# Patient Record
Sex: Female | Born: 1935 | Race: White | Hispanic: No | Smoking: Former smoker
Health system: Southern US, Community
[De-identification: ages and names within clinical notes are randomized; demographics above are authoritative.]

## PROBLEM LIST (undated history)

## (undated) DIAGNOSIS — N390 Urinary tract infection, site not specified: Secondary | ICD-10-CM

## (undated) DIAGNOSIS — R112 Nausea with vomiting, unspecified: Secondary | ICD-10-CM

## (undated) DIAGNOSIS — I519 Heart disease, unspecified: Secondary | ICD-10-CM

## (undated) DIAGNOSIS — E785 Hyperlipidemia, unspecified: Secondary | ICD-10-CM

## (undated) DIAGNOSIS — I10 Essential (primary) hypertension: Secondary | ICD-10-CM

## (undated) DIAGNOSIS — IMO0002 Reserved for concepts with insufficient information to code with codable children: Secondary | ICD-10-CM

## (undated) DIAGNOSIS — I509 Heart failure, unspecified: Secondary | ICD-10-CM

## (undated) DIAGNOSIS — R001 Bradycardia, unspecified: Secondary | ICD-10-CM

## (undated) DIAGNOSIS — K56609 Unspecified intestinal obstruction, unspecified as to partial versus complete obstruction: Secondary | ICD-10-CM

## (undated) DIAGNOSIS — E46 Unspecified protein-calorie malnutrition: Secondary | ICD-10-CM

## (undated) DIAGNOSIS — I779 Disorder of arteries and arterioles, unspecified: Secondary | ICD-10-CM

## (undated) DIAGNOSIS — C189 Malignant neoplasm of colon, unspecified: Secondary | ICD-10-CM

## (undated) DIAGNOSIS — I2510 Atherosclerotic heart disease of native coronary artery without angina pectoris: Secondary | ICD-10-CM

## (undated) DIAGNOSIS — J449 Chronic obstructive pulmonary disease, unspecified: Secondary | ICD-10-CM

## (undated) DIAGNOSIS — E119 Type 2 diabetes mellitus without complications: Secondary | ICD-10-CM

## (undated) DIAGNOSIS — Z9889 Other specified postprocedural states: Secondary | ICD-10-CM

## (undated) DIAGNOSIS — I739 Peripheral vascular disease, unspecified: Secondary | ICD-10-CM

## (undated) DIAGNOSIS — R609 Edema, unspecified: Secondary | ICD-10-CM

## (undated) DIAGNOSIS — R9430 Abnormal result of cardiovascular function study, unspecified: Secondary | ICD-10-CM

## (undated) DIAGNOSIS — Z720 Tobacco use: Secondary | ICD-10-CM

## (undated) DIAGNOSIS — R21 Rash and other nonspecific skin eruption: Secondary | ICD-10-CM

## (undated) DIAGNOSIS — I340 Nonrheumatic mitral (valve) insufficiency: Secondary | ICD-10-CM

## (undated) DIAGNOSIS — Z9289 Personal history of other medical treatment: Secondary | ICD-10-CM

## (undated) HISTORY — DX: Abnormal result of cardiovascular function study, unspecified: R94.30

## (undated) HISTORY — DX: Unspecified protein-calorie malnutrition: E46

## (undated) HISTORY — DX: Tobacco use: Z72.0

## (undated) HISTORY — PX: ABDOMINOPERINEAL PROCTOCOLECTOMY: SUR8

## (undated) HISTORY — DX: Heart disease, unspecified: I51.9

## (undated) HISTORY — DX: Personal history of other medical treatment: Z92.89

## (undated) HISTORY — DX: Nonrheumatic mitral (valve) insufficiency: I34.0

## (undated) HISTORY — PX: COLOSTOMY: SHX63

## (undated) HISTORY — DX: Hyperlipidemia, unspecified: E78.5

## (undated) HISTORY — DX: Atherosclerotic heart disease of native coronary artery without angina pectoris: I25.10

## (undated) HISTORY — DX: Chronic obstructive pulmonary disease, unspecified: J44.9

## (undated) HISTORY — DX: Disorder of arteries and arterioles, unspecified: I77.9

## (undated) HISTORY — DX: Reserved for concepts with insufficient information to code with codable children: IMO0002

## (undated) HISTORY — DX: Edema, unspecified: R60.9

## (undated) HISTORY — DX: Bradycardia, unspecified: R00.1

## (undated) HISTORY — DX: Urinary tract infection, site not specified: N39.0

## (undated) HISTORY — DX: Malignant neoplasm of colon, unspecified: C18.9

## (undated) HISTORY — DX: Rash and other nonspecific skin eruption: R21

## (undated) HISTORY — DX: Unspecified intestinal obstruction, unspecified as to partial versus complete obstruction: K56.609

## (undated) HISTORY — DX: Peripheral vascular disease, unspecified: I73.9

## (undated) HISTORY — DX: Type 2 diabetes mellitus without complications: E11.9

---

## 1997-10-13 ENCOUNTER — Other Ambulatory Visit: Admission: RE | Admit: 1997-10-13 | Discharge: 1997-10-13 | Payer: Self-pay | Admitting: Oncology

## 1998-06-09 ENCOUNTER — Ambulatory Visit (HOSPITAL_COMMUNITY): Admission: RE | Admit: 1998-06-09 | Discharge: 1998-06-09 | Payer: Self-pay | Admitting: Gastroenterology

## 1999-02-08 ENCOUNTER — Other Ambulatory Visit: Admission: RE | Admit: 1999-02-08 | Discharge: 1999-02-08 | Payer: Self-pay | Admitting: Family Medicine

## 1999-02-24 ENCOUNTER — Ambulatory Visit (HOSPITAL_COMMUNITY): Admission: RE | Admit: 1999-02-24 | Discharge: 1999-02-24 | Payer: Self-pay | Admitting: Family Medicine

## 1999-02-24 ENCOUNTER — Encounter: Payer: Self-pay | Admitting: Family Medicine

## 1999-04-12 ENCOUNTER — Encounter: Admission: RE | Admit: 1999-04-12 | Discharge: 1999-07-11 | Payer: Self-pay | Admitting: Family Medicine

## 1999-07-12 ENCOUNTER — Encounter: Admission: RE | Admit: 1999-07-12 | Discharge: 1999-10-10 | Payer: Self-pay | Admitting: Family Medicine

## 2000-06-19 ENCOUNTER — Ambulatory Visit (HOSPITAL_COMMUNITY): Admission: RE | Admit: 2000-06-19 | Discharge: 2000-06-19 | Payer: Self-pay | Admitting: Family Medicine

## 2001-04-02 ENCOUNTER — Ambulatory Visit (HOSPITAL_COMMUNITY): Admission: RE | Admit: 2001-04-02 | Discharge: 2001-04-02 | Payer: Self-pay | Admitting: Family Medicine

## 2001-04-20 ENCOUNTER — Emergency Department (HOSPITAL_COMMUNITY): Admission: EM | Admit: 2001-04-20 | Discharge: 2001-04-20 | Payer: Self-pay | Admitting: Emergency Medicine

## 2001-04-20 ENCOUNTER — Encounter: Payer: Self-pay | Admitting: Emergency Medicine

## 2001-12-27 ENCOUNTER — Encounter: Payer: Self-pay | Admitting: Family Medicine

## 2001-12-27 ENCOUNTER — Encounter: Admission: RE | Admit: 2001-12-27 | Discharge: 2001-12-27 | Payer: Self-pay | Admitting: Family Medicine

## 2002-10-01 ENCOUNTER — Ambulatory Visit (HOSPITAL_COMMUNITY): Admission: RE | Admit: 2002-10-01 | Discharge: 2002-10-01 | Payer: Self-pay | Admitting: Gastroenterology

## 2002-10-01 ENCOUNTER — Encounter (INDEPENDENT_AMBULATORY_CARE_PROVIDER_SITE_OTHER): Payer: Self-pay | Admitting: Specialist

## 2004-03-04 ENCOUNTER — Inpatient Hospital Stay (HOSPITAL_COMMUNITY): Admission: EM | Admit: 2004-03-04 | Discharge: 2004-03-09 | Payer: Self-pay | Admitting: Emergency Medicine

## 2005-03-27 ENCOUNTER — Inpatient Hospital Stay (HOSPITAL_COMMUNITY): Admission: EM | Admit: 2005-03-27 | Discharge: 2005-03-29 | Payer: Self-pay | Admitting: Emergency Medicine

## 2005-11-26 ENCOUNTER — Inpatient Hospital Stay (HOSPITAL_COMMUNITY): Admission: EM | Admit: 2005-11-26 | Discharge: 2005-12-08 | Payer: Self-pay | Admitting: Emergency Medicine

## 2005-12-05 ENCOUNTER — Ambulatory Visit: Payer: Self-pay | Admitting: Cardiology

## 2005-12-05 ENCOUNTER — Encounter: Payer: Self-pay | Admitting: Cardiology

## 2005-12-20 ENCOUNTER — Encounter: Admission: RE | Admit: 2005-12-20 | Discharge: 2005-12-20 | Payer: Self-pay | Admitting: Family Medicine

## 2007-06-04 ENCOUNTER — Emergency Department (HOSPITAL_COMMUNITY): Admission: EM | Admit: 2007-06-04 | Discharge: 2007-06-04 | Payer: Self-pay | Admitting: Emergency Medicine

## 2007-11-10 ENCOUNTER — Inpatient Hospital Stay (HOSPITAL_COMMUNITY): Admission: EM | Admit: 2007-11-10 | Discharge: 2007-11-13 | Payer: Self-pay | Admitting: Emergency Medicine

## 2008-07-10 ENCOUNTER — Emergency Department (HOSPITAL_COMMUNITY): Admission: EM | Admit: 2008-07-10 | Discharge: 2008-07-10 | Payer: Self-pay | Admitting: Emergency Medicine

## 2010-09-26 LAB — URINALYSIS, ROUTINE W REFLEX MICROSCOPIC
Bilirubin Urine: NEGATIVE
Glucose, UA: NEGATIVE mg/dL
Hgb urine dipstick: NEGATIVE
Ketones, ur: NEGATIVE mg/dL
Nitrite: NEGATIVE
Protein, ur: NEGATIVE mg/dL
Specific Gravity, Urine: 1.002 — ABNORMAL LOW (ref 1.005–1.030)
Urobilinogen, UA: 0.2 mg/dL (ref 0.0–1.0)
pH: 6.5 (ref 5.0–8.0)

## 2010-09-26 LAB — CBC
HCT: 40.5 % (ref 36.0–46.0)
Hemoglobin: 13.5 g/dL (ref 12.0–15.0)
MCHC: 33.3 g/dL (ref 30.0–36.0)
MCV: 88.2 fL (ref 78.0–100.0)
RBC: 4.59 MIL/uL (ref 3.87–5.11)

## 2010-09-26 LAB — BASIC METABOLIC PANEL
CO2: 26 mEq/L (ref 19–32)
Chloride: 102 mEq/L (ref 96–112)
GFR calc Af Amer: >60 mL/min (ref >60)
Potassium: 3.9 mEq/L (ref 3.5–5.1)
Sodium: 137 mEq/L (ref 135–145)

## 2010-09-26 LAB — DIFFERENTIAL
Basophils Relative: 0 % (ref 0–1)
Eosinophils Absolute: 0 10*3/uL (ref 0.0–0.7)
Monocytes Absolute: 0.5 10*3/uL (ref 0.1–1.0)
Monocytes Relative: 5 % (ref 3–12)

## 2010-10-25 NOTE — Discharge Summary (Signed)
Monica Rhodes, Monica Rhodes              ACCOUNT NO.:  [id number]   MEDICAL RECORD NO.:  [id number]          PATIENT TYPE:  INP   LOCATION:  5501                         FACILITY:  [facility]   PHYSICIAN:  Madaline Savage, MD        DATE OF BIRTH:  1928/01/27   DATE OF ADMISSION:  11/10/2007  DATE OF DISCHARGE:  11/13/2007                               DISCHARGE SUMMARY   PRIMARY CARE PHYSICIAN:  Renaye Rakers, MD   DISCHARGE DIAGNOSES:  1. Right lower lobe pneumonia.  2. Hypertension.  3. Diabetes mellitus.  4. Deconditioning.  5. History of chronic obstructive pulmonary disease.   DISCHARGE MEDICATIONS:  1. Zithromax 500 mg once daily for 4 more days.  2. Ceftin 500 mg twice daily for 4 more days.  3. Mucinex 600 mg twice daily as needed for 1 week.  4. Digoxin 0.125 mg daily.  5. Clonidine 0.2 mg twice daily.  6. Prazosin 2 mg twice daily.  7. TriCor 145 mg daily.  8. Tekturna 150/25 one tablet daily.   HISTORY OF PRESENT ILLNESS:  For a full history and physical, see the  history and physical dictated by Dr. Darene Lamer on Nov 10, 2007.  In short,  [name] is a 77 year old lady who comes in with complaints of cough  and generalized weakness for 1-week duration.  She was found to have a  right lower lobe pneumonia in her x-ray, and she was febrile with a  fever of 101.8, and she had a white count of 15.5 on admission.  She was  admitted for IV antibiotics.   HOSPITAL COURSE:  1. Right lower lobe pneumonia.  [name] was treated with Rocephin      and Zithromax for her pneumonia.  She had blood cultures drawn,      which did not grow any organism.  She said she has had pneumococcal      vaccination approximately 2 years ago.  Her cultures have been      negative here so far.  She has improved clinically.  She is      afebrile for the last 2 days.  Her white count has now come back to      normal.  We will treat her for a complete course of 8 days of      antibiotics.  I will change her to  p.o. Zithromax and Ceftin.  2. Deconditioning.  Ms. Engelken was weak on admission.  This is likely      deconditioning secondary to her pneumonia.  She was evaluated by      physical therapy and occupational therapy in the hospital and her      home health, PT, OT at this time.  3. Hypertension.  Her blood pressure has been controlled while she was      in the hospital.  We will continue her same dose of medication as      before.  4. Diabetes mellitus.  This is diet controlled at this time.  Her      sugars have been fair in the hospital.  DISPOSITION:  She is now being discharged home in stable condition.  We  will arrange for home health, PT, OT for her prior to discharge.   FOLLOWUP:  She is asked to follow up with her primary care doctor, Dr.  Parke Simmers, with her next scheduled appointment.      Madaline Savage, MD  Electronically Signed     PKN/MEDQ  D:  11/13/2007  T:  11/14/2007  Job:  [id number]

## 2010-10-25 NOTE — H&P (Signed)
NAME:  Monica Rhodes, Monica Rhodes              ACCOUNT NO.:  [id number]   MEDICAL RECORD NO.:  [id number]          PATIENT TYPE:  INP   LOCATION:  5501                         FACILITY:  [facility]   PHYSICIAN:  Madaline Savage, MD        DATE OF BIRTH:  1928-05-29   DATE OF ADMISSION:  11/10/2007  DATE OF DISCHARGE:                              HISTORY & PHYSICAL   PRIMARY CARE PHYSICIAN:  Renaye Rakers, M.D.   CHIEF COMPLAINT:  Cough and generalized weakness.   HISTORY OF PRESENT ILLNESS:  Monica Rhodes is a 77 year old lady with a  history of hypertension, diet controlled diabetes mellitus and COPD, who  comes in with complaints of weakness and cough for the last 1 week.  She  started having cough for the last week and has progressively been  getting more weak and dehydrated, as per the family.  She also now  complains of some shortness of breath today.  They managed to the Urgent  Care at [facility] and x-ray done there showed a right lower lobe  pneumonia.  She was then transferred to [facility] ED for admission.  She denies any other complaints at this time.   PAST MEDICAL HISTORY:  1. Hypertension.  2. Diet-controlled diabetes mellitus.  3. History of COPD.  4. History of colon cancer approximately 10 years ago, status post      partial colectomy.   PAST SURGICAL HISTORY:  History of partial colectomy in the past.   ALLERGIES:  NO KNOWN DRUG ALLERGIES.   CURRENT MEDICATIONS:  1. Clonidine 0.2 mg twice daily.  2. Digoxin 0.125 mg daily.  3. Prazosin 2 mg twice daily.  4. Tricor 145 mg daily.  5. Tekturna 150/25 one tablet daily.   SOCIAL HISTORY:  She smokes less than one pack of cigarettes a day.  Denies any history of alcohol or drug abuse.  She lives with her son.   FAMILY HISTORY:  Noncontributory.   REVIEW OF SYSTEMS:  GENERAL:  She denies any recent weight loss or  weight gain.  She denied having any fevers, but she was found to be  febrile here.  She denies any chills.  HEENT:  Denies any headaches, any  sore throat, any blurred vision.  CARDIOVASCULAR:  Denies chest pain,  palpitations.  RESPIRATORY:  She does complain of shortness of breath  and cough.  GI:  No abdominal pain, nausea, vomiting, diarrhea or  constipation.   PHYSICAL EXAMINATION:  She is alert and oriented x3.  VITALS:  Temperature 101.8, pulse rate 90, blood pressure 150/63,  respiratory rate 18, oxygen saturations 93% on room air.  HEENT:  Head atraumatic, normocephalic.  Pupils bilaterally equal and  reactive to light.  Mucous membranes appear moist.  NECK:  Supple.  No JVD, no carotid bruits.  CARDIOVASCULAR:  S1 and S2 heard.  Regular rate and rhythm.  CHEST:  There were a few crackles heard at the right base.  ABDOMEN:  Soft.  Bowel sounds heard.  EXTREMITIES:  No edema, cyanosis or clubbing.   LABS:  White count 15.5 with 85% neutrophils,  hemoglobin 11.9, platelets  205.  Sodium 138, potassium 4, BUN 34, creatinine 1.5, glucose 132.  X-  ray of the chest done at the Urgent Care showed a right lower lobe  pneumonia.   IMPRESSION:  1. Right lower lobe pneumonia.  2. Leukocytosis.  3. Hypertension.  4. Diabetes mellitus.  5. History of chronic obstructive pulmonary disease.   PLAN:  1. Right Lower Lobe Pneumonia.  This is a 77 year old lady who comes      in with right lower lobe pneumonia.  She has leukocytosis with a      left shift.  She is febrile with a fever of 101.8.  I will admit      her and start her on IV Rocephin and Zithromax.  I will give her      aerosol treatments.  I will also hydrate her gently and I will ask      physical therapy and occupational therapy to see her, as she has      some deconditioning at this time.  2. Hypertension.  Her blood pressure is mildly elevated at this time.      I will continue her home medications at this time.  3. Diet-controlled Diabetes Mellitus.  Her sugar is 132 at this time.      I will check her CBGs and put her on a sliding  scale at this time.  4. I will put her on deep vein thrombosis prophylaxis.      Madaline Savage, MD  Electronically Signed     PKN/MEDQ  D:  11/10/2007  T:  11/10/2007  Job:  [id number]

## 2010-10-28 NOTE — Discharge Summary (Signed)
NAMEJEANENNE, LICEA              ACCOUNT NO.:  [id number]   MEDICAL RECORD NO.:  [id number]          PATIENT TYPE:  INP   LOCATION:  5505                         FACILITY:  [facility]   PHYSICIAN:  Lonia Blood, M.D.DATE OF BIRTH:  05-Sep-1927   DATE OF ADMISSION:  11/26/2005  DATE OF DISCHARGE:  12/08/2005                                 DISCHARGE SUMMARY   Please note this is an interim discharge summary.  This dictation covers the  period from December 04, 2005 through December 08, 2005.   DISCHARGE MEDICATIONS:  1.  Protonix 40 mg q. day.  2.  Hydralazine 25 mg q.i.d.  3.  Clonidine 0.1 mg b.i.d.  4.  Lotensin 10 mg q. day.  5.  Multivitamin q. Day.   DISCHARGE DIAGNOSES:  1.  Please see previously dictated discharge diagnoses from Dr. Ellin Saba      dictation from December 02, 2005.  2.  Large left-sided pleural effusion.      1.  Layers on decubitus film.      2.  Echocardiogram without evidence of left ventricular systolic          dysfunction.      3.  Albumin 2.0.      4.  Felt to be secondary to oncotic pressure and highly responsive to          Lasix.  3.  Bradycardia.      1.  Thought to be negative chronotropic medications.      2.  Responding to decreased clonidine dose.   FOLLOWUP:  1.  Dr. Johna Sheriff on June 5 at 9:15 a.m.  2.  Dr. Parke Simmers in seven to ten days.  In followup with Dr. Parke Simmers, blood      pressure should be watched closely.  The patient's bradycardia should      also be reevaluated.  If possible, one should consider discontinuing the      patient's clonidine altogether.   ADDENDUM TO HOSPITAL COURSE:  Ms. Crupi had been progressing nicely from  the standpoint of her small bowel obstruction and parastomal hernia.  For  details concerning the initial portion of the hospitalization, please see  the dictated discharge summary by Dr. Lonia Blood on December 02, 2005.  Hospitalization was prolonged because of two primary outstanding issues.   The first issue was  significant hypoxia.  O2 sats remained in the low 90  range on room air.  Chest x-ray revealed a significant left-sided pleural  effusion with no evidence of underlying infiltrate.  Decubitus film was  obtained and did reveal that a large portion of this effusion layered out.  Albumin was noted to be 2.0.  Echocardiogram was obtained and revealed  preserved LV systolic function with no evidence of diastolic dysfunction.  These findings combined suggested this was simply a pleural effusion due to  low oncotic pressure.  The patient responded extremely well to diuretic  therapy.  At the time of discharge, O2 sats had improved to 97% on room air,  and the patient was completely asymptomatic.   The second issue that was encountered during  the remainder of the  hospitalization was that of bradycardia.  The patient had episodes of heart  rate dipping into the 40s.  A review of medication list revealed that the  patient was on clonidine at a reasonably high dose.  The decision was made  to decrease clonidine.  The clonidine was not discontinued altogether  however for fear of aggressive rebound hypertension.  The patient remained  completely asymptomatic even during episodes of severe bradycardia.  She  maintained blood pressures greater than 120.  Further inpatient stay was not  felt to be necessary to treat her bradycardia.  However, it is recommended  that we continue to decrease the patient's clonidine until such time that we  can discontinue it altogether.  At time of discharge, heart rate is 73 and  the patient is entirely asymptomatic.   The patient is deemed to be stable for discharge on December 08, 2005.  She is  to follow up with Dr. Johna Sheriff as noted above for staple removal and  reevaluation of her abdominal wounds.  She is having no abdominal pain and  is tolerating a regular diet.  She is to follow up with Dr. Renaye Rakers, her  primary care physician, in seven to ten days for routine  recheck.  At that  time, attention should be paid to the possibility of further decreasing the  patient's clonidine and reassessing her heart rate.      Lonia Blood, M.D.  Electronically Signed     JTM/MEDQ  D:  12/08/2005  T:  12/08/2005  Job:  [id number]   cc:   Lorne Skeens. Hoxworth, M.D.  [address]  [place]  [place] [address]   Renaye Rakers, M.D.  Fax: [phone]

## 2010-10-28 NOTE — Op Note (Signed)
NAME:  [facility], Monica [facility]                        ACCOUNT NO.:  [id number]   MEDICAL RECORD NO.:  [id number]                   PATIENT TYPE:  AMB   LOCATION:  ENDO                                 FACILITY:  [facility]   PHYSICIAN:  Anselmo Rod, M.D.               DATE OF BIRTH:  21-Sep-1927   DATE OF PROCEDURE:  10/01/2002  DATE OF DISCHARGE:                                 OPERATIVE REPORT   PROCEDURE PERFORMED:  Colonoscopy with snare polypectomy times five and cold  biopsies times two.   ENDOSCOPIST:  Charna Elizabeth, M.D.   INSTRUMENT USED:  Pediatric adjustable Olympus colonoscope.   INDICATIONS FOR PROCEDURE:  Personal history of rectal cancer in a 77-year-  old white female.  Rule out recurrent polyps.   PREPROCEDURE PREPARATION:  Informed consent was procured from the patient.  The patient was fasted for eight hours prior to the procedure and prepped  with a bottle of magnesium citrate and a gallon of GoLYTELY the night prior  to the procedure.   PREPROCEDURE PHYSICAL:  The patient had stable vital signs.  Neck supple.  Chest clear to auscultation.  S1 and S2 regular.  Abdomen soft with normal  bowel sounds.  Left lower quadrant colostomy bag present.   DESCRIPTION OF PROCEDURE:  The patient was placed in supine position.  The  colostomy bag was removed and the colostomy site cleaned gently and the  patient was sedated with 30 mg of Demerol and 3 mg of Versed intravenously.  Once the patient was adequately sedated and maintained on low flow oxygen  and continuous cardiac monitoring, the Olympus video colonoscope was  advanced from the rectum to the cecum.  There was some residual stool in the  colon, multiple washes were done.  The small sessile polyp was biopsied from  80 cm.  Three small sessile polyps were snared from 90 cm.  These were all  placed in one bottle.  Two small polyps were snared from the midright colon  as well.  These were placed in bottle #2.  There was  evidence of mild  melanosis coli throughout the colon.  The appendicular orifice and ileocecal  valve were clearly visualized and photographed.  The patient tolerated the  procedure well without complications.  There were a few scattered  diverticula seen throughout the colon.   IMPRESSION:  1. Multiple colonic polyps removed as mentioned above (see description     above).  2. Few scattered diverticula throughout the colon.  3. Mild melanosis coli.            RECOMMENDATIONS:  1. Await pathology results.  2. Avoid all nonsteroidals including aspirin for the next three to four     weeks.  3. Outpatient follow-up in the next two weeks for further recommendations.  Anselmo Rod, M.D.    JNM/MEDQ  D:  10/01/2002  T:  10/01/2002  Job:  [id number]   cc:   Renaye Rakers, M.D.  [phone]  [place]  [place] [address]  Fax: [phone]   Vikki Ports, M.D.  [phone]  [place]  [place] [address]  Fax: [phone]   Valentino Hue. Magrinat, M.D.  [address] [facility]  [place]  [place] [address]  Fax: [phone]

## 2010-10-28 NOTE — Consult Note (Signed)
[facility], Monica Rhodes              ACCOUNT NO.:  [id number]   MEDICAL RECORD NO.:  [id number]          PATIENT TYPE:  INP   LOCATION:  5506                         FACILITY:  [facility]   PHYSICIAN:  Jordan Hawks. Elnoria Howard, MD    DATE OF BIRTH:  July 25, 1927   DATE OF CONSULTATION:  11/28/2005  DATE OF DISCHARGE:                                   CONSULTATION   REASON FOR CONSULTATION:  Nausea, vomiting and abdominal pain.   HISTORY OF PRESENT ILLNESS:  This is a 77 year old white female who is  admitted for complaints of abdominal pain that started acutely on Saturday.  The patient's pain worsened to the point that she required further  evaluation and presented to the ER on Sunday. An acute series was performed  and it showed that she had some distention of the small bowel consistent  with either a small bowel ileus versus a small-bowel obstruction. She was  treated previously in the past for small-bowel obstruction with conservative  management, that is using an NG tube. She is status post rectal cancer  resection with colostomy in 1997 and has done well since that time in  regards to the rectal cancer. She did undergo a colonoscopy performed by Dr.  Loreta Ave one week ago with findings of two small adenomatous polyps but no other  abnormal findings. Her pain is consistent with her prior episodes and since  being admitted to the hospital she denies having any significant nausea but  prior to her admission she had several bouts of vomiting without any  evidence of hematemesis. There is no evidence of any diarrhea or fever. The  patient does report that she had a very low output into her colostomy bag.   PAST MEDICAL AND SURGICAL HISTORY:  Significant for hypertension, diabetes  and rectal cancer status post resection with colostomy.   FAMILY HISTORY:  Noncontributory.   SOCIAL HISTORY:  The patient is a longtime smoker and currently lives with  her son. No prior history of alcohol or illicit drug  use.   ALLERGIES:  PHENERGAN.   MEDICATIONS:  Clonidine, digoxin, Lasix and Tri-Chlor.   PHYSICAL EXAMINATION:  VITAL SIGNS:  Blood pressure is 177/73, heart rate is  72, respirations 20, temperature is 98.5.  GENERAL:  The patient is in moderate distress.  HEENT:  Normocephalic, atraumatic, extraocular muscles intact. Pupils equal  round and reactive to light.  NECK:  Supple, no lymphadenopathy.  LUNGS:  Clear to auscultation bilaterally.  CARDIOVASCULAR:  Regular rate and rhythm.  ABDOMEN:  Flat. There is tympany, positive bowel sounds and a small amount  of stool and gas in the colostomy bag.  EXTREMITIES:  No cyanosis, clubbing or edema.   LABORATORY DATA:  On November 28, 2005, sodium was 143, potassium 4.2, chloride  109, CO2 29, glucose is 97, BUN 26, creatinine 0.9. CBC, white blood cell  count 10.2, hemoglobin 13.7, MCV 75.7, platelets 213.   IMPRESSION:  1.  Small-bowel obstruction versus ileus.  2.  Abdominal pain secondary to #1.  3.  Status post rectal cancer resection with colostomy.  4.  Hypertension.  5.  Diabetes.   At this time, I believe the patient would benefit from having NG tube placed  as this was the prior treatment and she did report an improvement in her  symptoms. Her abdomen is not as distended as previously reported. However,  she does not want to have an NG tube as this time. She states that she has  an upper respiratory infection and would like to avoid using the NG tube.  However if her symptoms do not improve, I will insist that she undergo this  treatment. There is no evidence of an acute abdomen at this time and  therefore no immediate intervention is required. I believe a CT scan of the  abdomen and pelvis will be beneficial in order to see if she has a  transition point that would lead more to a diagnosis of small-bowel  obstruction.   PLAN:  1.  Continue n.p.o. status and conservative management.  2.  Agree with CT scan of the abdomen and  pelvis.      Jordan Hawks Elnoria Howard, MD  Electronically Signed     PDH/MEDQ  D:  11/28/2005  T:  11/28/2005  Job:  [id number]

## 2010-10-28 NOTE — H&P (Signed)
[facility]              ACCOUNT NO.:  [id number]   MEDICAL RECORD NO.:  [id number]          PATIENT TYPE:  INP   LOCATION:  0101                         FACILITY:  [facility]   PHYSICIAN:  Vikki Ports, MDDATE OF BIRTH:  1927/09/01   DATE OF ADMISSION:  03/27/2005  DATE OF DISCHARGE:                                HISTORY & PHYSICAL   ADMISSION DIAGNOSIS:  Partial small bowel obstruction.   HISTORY OF PRESENT ILLNESS:  The patient is a very pleasant 77 year old  white female who underwent abdominoperineal resection by me in 1997.  The  patient now presents with nausea and vomiting since late afternoon  yesterday.  The patient has had some minimal output from her ostomy since  that time.  She feels somewhat better after NG tube has been placed in the  emergency room.   PAST SURGICAL HISTORY:  Significant as above.   PAST MEDICAL HISTORY:  1.  Cardiomegaly.  2.  Hypercholesterolemia.  3.  Hypertension.  4.  Noninsulin-dependent diabetes mellitus.   PHYSICAL EXAMINATION:  GENERAL:  She is an age-appropriate female in  moderate distress.  VITAL SIGNS:  Blood pressure 176/85; heart rate 80; respiratory rate 16.  HEENT:  Normocephalic, atraumatic.  Pupils are equal, round, and reactive.  NECK:  Supple and soft, with a right carotid bruit.  LUNGS:  Clear to auscultation and percussion x2.  ABDOMEN:  Moderately obese but soft, completely nontender, with some hyper-  pitched bowel sounds.  Stoma is intact and healthy.  EXTREMITIES:  Show no clubbing, cyanosis, or edema.   LABORATORY VALUES:  White count 15,000.  Abdominal x-ray is consistent with  a partial small bowel obstruction.   IMPRESSION:  Partial small bowel obstruction.   PLAN:  Admission.  IV fluids.  NG tube decompression.  Follow up films and  clinical exam.      Vikki Ports, MD  Electronically Signed     KRH/MEDQ  D:  03/27/2005  T:  03/27/2005  Job:  [id number]

## 2010-10-28 NOTE — H&P (Signed)
NAME:  [facility], Monica [facility]              ACCOUNT NO.:  [id number]   MEDICAL RECORD NO.:  [id number]          PATIENT TYPE:  EMS   LOCATION:  MAJO                         FACILITY:  [facility]   PHYSICIAN:  Mobolaji B. Bakare, M.D.DATE OF BIRTH:  1927-12-18   DATE OF ADMISSION:  03/03/2004  DATE OF DISCHARGE:                                HISTORY & PHYSICAL   ADDENDUM:  For copy-to list only.       MBB/MEDQ  D:  03/04/2004  T:  03/04/2004  Job:  [id number]   cc:   Renaye Rakers, M.D.  [phone] [address], [address]  [place]  [place] [address]  Fax: [phone]

## 2010-10-28 NOTE — Discharge Summary (Signed)
NAMEPHELAN, GOERS              ACCOUNT NO.:  [id number]   MEDICAL RECORD NO.:  [id number]          PATIENT TYPE:  INP   LOCATION:  5151                         FACILITY:  [facility]   PHYSICIAN:  Isidor Holts, M.D.  DATE OF BIRTH:  [birth year]   DATE OF ADMISSION:  03/04/2004  DATE OF DISCHARGE:  03/09/2004                                 DISCHARGE SUMMARY   PMD:  Dr. Renaye Rakers.   PRIMARY DIAGNOSES:  1.  Partial small bowel obstruction.  2.  Urinary tract infection.   SECONDARY DIAGNOSES:  1.  Diet controlled diabetes mellitus.  2.  Hypertension.  3.  Dyslipidemia.  4.  Tobacco use.   DISCHARGE MEDICATIONS:  1.  Nicoderm CQ (21 mg) patch applied to skin daily for smoking cessation.  2.  Ciprofloxacin 500 mg p.o. b.i.d. for 2 days.  3.  Continue preadmission medication.   PROCEDURES:  1.  Abdominal series with single view chest x-ray dated March 03, 2004,      which showed multiple dilated small bowel loops with air fluid levels,      multiple surgical clips in the lower abdomen, no free intraperitoneal      air.  There was also cardiomegaly and probable COPD.  2.  CT abdomen/pelvis dated March 04, 2004, showed cardiomegaly, small      pericardial effusion, mild ascites, mild left lower lobe atelectasis,      small oval area of decreased density in each kidney.  These represent      complex cyst or solid masses.  Abdominal CT scan with contrast was      recommended in 3 months.  Also noted was high grade partial small bowel      obstruction, small amount of free peritoneal fluid, colon      diverticulosis, __________ without obstruction.  3.  Abdominal x-ray dated March 05, 2004.  This showed slightly      distended loops of small bowel which remained secondary to partial small      bowel obstruction, NG tube tip in distal antrum/duodenal bulb, no free      air.   CONSULTATIONS:  Chevis Pretty, III, M.D., general surgery.   ADMISSION HISTORY:  Essentially as  per H&P dated March 04, 2004.  However, in brief this is a 77 year old Caucasian female with a history of  hypertension and diet controlled diabetes and the prior history of colectomy  for prior colon cancer in 1997 as well as colonoscopy 2004 with polypectomy.  She developed acute onset of nausea and vomiting at about noon on the day of  admission associated with mild periumbilical abdominal pain following eating  lunch of [place] sandwich.  Initial physical examination demonstrated  dehydration and laboratory investigations revealed a white cell count of  18,200.  LFTs were normal.  Urinalysis showed a positive sediment consistent  with urinary tract infection.  Abdominal series confirmed small bowel  obstruction.  The patient was admitted for further investigation, workup and  appropriate management.  Dr. Chevis Pretty, from general surgery, was called in  consultation.   HOSPITAL COURSE:  1.  Partial  small bowel obstruction.  Patient was kept n.p.o. and commenced      on intravenous fluid hydration.  NG tube was passed and low pressure      suction was applied.  With this conservative regimen, patient's symptoms      gradually subsided, bowel sounds returned, patient started passing      flatus and gas, abdominal pain resolved.  Improvement was steady      thereafter and eventually NG was removed and patient was able to      recommence oral intake with gradual broadening of her diet without      untoward effects.  2.  Urinary tract infection treated successfully with a course of      ciprofloxacin.  3.  Diabetes mellitus.  Patient's blood glucose remained within normal      limits during the course of her hospital stay.  4.  Dyslipidemia.  Patient is likely to recommence her preadmission lipid      lowering medication under the auspices of her PMD.  5.  Hypertension.  This proved difficult to control during the initial days      of patient's hospital stay, principally because she was  unable to      recommence her p.o. medication had to be given parenteral forms of      antihypertensives, however, once she resumed oral intake BP was      adequately controlled with her preadmission medications which we      expected to continue.   DISPOSITION:  Patient was subsequently discharged in satisfactory condition  on March 09, 2004.   ACTIVITIES:  As tolerated.  She is to continue a diabetic diet.  She has a  followup appointment scheduled with Dr. Renaye Rakers, her PMD, on March 16, 2004.       CO/MEDQ  D:  03/12/2004  T:  03/13/2004  Job:  [id number]

## 2010-10-28 NOTE — H&P (Signed)
NAME:  Monica Rhodes, Monica Rhodes              ACCOUNT NO.:  [id number]   MEDICAL RECORD NO.:  [id number]          PATIENT TYPE:  INP   LOCATION:  5506                         FACILITY:  [facility]   PHYSICIAN:  Salomon Fick, N.P.    DATE OF BIRTH:  November 16, 1927   DATE OF ADMISSION:  11/26/2005  DATE OF DISCHARGE:                                HISTORY & PHYSICAL   CHIEF COMPLAINT:  A 77 year old, white, female patient who presented  complaining of abdominal pain.   HISTORY OF PRESENT ILLNESS:  This is a 77 year old, white, female patient  who came into hospital today because of abdominal pain.  Abdominal pain is  crampy and intermittent.  Pain is 8/10 in severity.  It was relieved at the  ED with IV pain medicine.  The patient had colonoscopy last week on Monday  and this was done by Dr. Loreta Ave.  The patient told me that she had some polyps  removed.  The biopsy report is pending.  Since the colonoscopy, the patient  has not been feeling well.  She has had very minimal movement, but she  denies any significant nausea or vomiting.  She also denies any abdominal  distention.  The patient told me she has had bowel obstruction in the past  especially in 2006.  At that time, she was very sick.  She had a lot of  nausea and vomiting at that time.  This time around, it seems that her  condition was much more stable compared to last time.  She has history of  colonoscopy in the past and she says she has not been passing a lot of stool  in the last few days.  She has not been eating adequately.  The patient  denies any headache, no chest pain, shortness of breath, but she says she  had some cold early last week which resolved already.  She denies any fever.  There are no joint pains.  There is no feet swelling.   PAST MEDICAL HISTORY:  1.  Hypertension.  2.  Diabetes, diet-controlled.  3.  History of colon cancer about 10 years ago for which she was treated.      The patient and family member admits she has  been free of cancer since      that time.   FAMILY HISTORY:  Noncontributory.   PAST SURGICAL HISTORY:  History of colon surgery for colon surgery with  colostomy bag.   SOCIAL HISTORY:  She smokes cigarettes with less than a pack per day.  She  has not smoked for the past few days.  She has no history of drug abuse.  She is retired.  She lives with her son.  She does not drink alcohol.  There  is no history of drug abuse.   ALLERGIES:  Bad reaction to PHENERGAN some time ago and she does not want  any of this.   MEDICATIONS:  1.  Clonidine 0.2 mg twice daily.  2.  Digoxin 0.125 mg daily.  3.  Lasix 20 mg daily.  4.  Tri-Chlor 145 mg daily.   PHYSICAL  EXAMINATION:  VITAL SIGNS:  Elevated blood pressure of 175/67,  respirations 16, pulse 59, temperature 98.4.  When she came in, oxygen  saturation was 94% on room air.  HEENT:  Conjunctivae pink.  She has loss of subcutaneous tissue.  She has no  jaundice.  Mucous membrane slightly dry.  NECK:  Supple.  No masses palpable.  CHEST:  Clear to auscultation.  Clear breath sounds.  No wheezes or  crackles.  CARDIAC:  S1, S2.  No murmur was appreciated.  No gallop.  There is no  carotid bruit.  Pulses are palpable in the periphery.  ABDOMEN:  Flat, soft, nondistended, slightly tender diffusely.  There is no  rebound or tenderness.  There are no masses palpable.  She has a colostomy  bag.  EXTREMITIES:  No edema.  She has very minimal varicose veins on the left.  No joint swelling.  No joint tenderness.  NEUROLOGIC:  The patient is alert and oriented to time, place and person.  There is no focal neurologic deficits.  Power is 4/4 in all extremities.  Tongue is midline.  Gait is not tested because of abdominal pain.   LABORATORY DATA AND X-RAY FINDINGS:  Abdominal x-ray was done which suggests  ileus versus partial small bowel obstruction.   Blood work shows sodium 139, potassium 3.6, chloride 100, bicarb 30, BUN 30,  creatinine  4.9, glucose 128.  AST 113, ALT 8, Alk phos 46, total bilirubin  1.0.  CBC shows WBC 10.2, hemoglobin 13.7, hematocrit 41.2, platelets 213  which area all within normal limits.   ASSESSMENT:  1.  Abdominal pain secondary to ileus versus partial small bowel      obstruction.  2.  History of hypertension.  3.  History of diabetes mellitus type 2 which is diet-controlled.  4.  History of colostomy.   PLAN:  Admit the patient to telemetry bed.  I will manage conservatively.  She is going to be n.p.o. for the next 18 hours at which time she will be  reviewed.  She will have fingerstick glucose check every 6 hours.  Check  oxygen saturation every 4-6 hours.  Record urine output.  Activity to be bed  rest.  Continue normal saline at 50 mL per hour while she is n.p.o. with 20  mEq of KCl to 1 L of normal saline.  I am going to repeat abdominal x-ray in  a.m.  IV Zofran 2 mg q.4h. p.r.n. nausea or vomiting.  I will continue  digoxin, Lasix, Tri-Chlor.  The patient will be consulted about tobacco  cessation.      Salomon Fick, N.P.     MES/MEDQ  D:  11/26/2005  T:  11/27/2005  Job:  [id number]

## 2010-10-28 NOTE — H&P (Signed)
NAME:  Achey, Merve              ACCOUNT NO.:  [id number]   MEDICAL RECORD NO.:  [id number]          PATIENT TYPE:  EMS   LOCATION:  MAJO                         FACILITY:  [facility]   PHYSICIAN:  Mobolaji B. Bakare, M.D.DATE OF BIRTH:  Jun 06, 1928   DATE OF ADMISSION:  03/03/2004  DATE OF DISCHARGE:                                HISTORY & PHYSICAL   CHIEF COMPLAINT:  Nausea, vomiting and abdominal pain.   HISTORY OF PRESENT ILLNESS:  Ms. Winfree is a 77 year old white female with  history of hypertension and diet controlled diabetes who is presenting with  acute onset of nausea and vomiting which started about noon today.  She had  accompanying mild periumbilical abdominal pain.  This afternoon she had a  [place] sandwich for lunch and thereafter developed nausea and vomiting.  She  vomited about 6 times; there has been no diarrhea, no fever, no chills.  She  also notes abdominal pain as more of a periumbilical discomfort which she  rates as a 5/10 in severity.  No aggravating factors.  The pain is not  completely relived by vomiting.  She does have a colostomy and back  subsequent to the colectomy she had _____ in 1997 and she changes the bag  every 3 days.  She has not noticed any increase or decrease in the volume of  stool.  She did change her bag today and no changes were seen after she  changed her bag today.   PAST SURGICAL HISTORY:  1.  Colectomy for prior colon cancer in 1997.  2.  Colonoscopy in 2004 with polypectomy.   REVIEW OF SYSTEMS:  She denies any dysuria, urgency, or increase in  micturition other than when she started using water pills.  No cough, no  chest pain, no shortness of breath.  No headaches. No skin rash.   PAST MEDICAL HISTORY:  1.  Significant for an enlarged heart.  2.  Hypercholesterolemia.  3.  Hypertension.  4.  Diabetes mellitus.  5.  Carotid stenosis.  She has had serial Dopplers done in follow up.   MEDICATIONS:  The patient cannot recall  the names and dosages of her  medications.  However she does state that she is on water pills.  She is on  Tricor and also some medications for hypertension.  Her daughter will bring  the list of medications as soon as possible.   ALLERGIES:  No known drug allergies.   SOCIAL HISTORY:  The patient lives with her son and she is a widow; she has  5 children.  She smokes cigarettes, 1 pack/day.  No history of alcohol  abuse.  She is fairly independent with activities of daily living.  She was  able to clean out the pool yesterday without any shortness of breath or  chest pain.   PHYSICAL EXAMINATION:  VITAL SIGNS:  In the emergency department temperature  was 98.4, blood pressure 187/82, pulse of 86, respiratory rate 24 and O2  saturations of 96% on room air.  GENERAL:  She is not acutely ill looking other than respiratory distress.  HEENT:  Atraumatic,  normocephalic head.  Pupils are equal, round and  reactive to light.  Mucous membranes dry.  NECK:  There is a right carotid bruit. No thyromegaly, no cervical  lymphadenopathy.  CHEST:  With clear breath sounds.  No crackles, no wheeze.  CARDIOVASCULAR:  S1, S2, ejection systolic murmur in the mitral area.  ABDOMEN:  Obese, soft, tender epigastrium, periumbilical.  Colostomy bag has  some feces.  Bowel sounds present, no palpable organomegaly.  No  holosystolic murmur.  EXTREMITIES:  No pedal edema, no calf tenderness, no cyanosis.  CNS:  No focal neurologic deficits.  SKIN:  No rash, no petechia.   LABORATORY DATA:  Initial data revealed a white cell count of 18,200,  hematocrit 44%, hemoglobin 15.1, platelets 222,000, neutrophils 97%,  lymphocytes 3%.  Sodium 142, potassium 4.2, chloride 104, bicarb 28, BUN 33,  glucose 160, creatinine 1.2, calcium 9.5, total protein 7.0, albumin 4.4.  AST 19, ALT 11, alkaline phosphatase 42, bilirubin 0.5.  Urinalysis:  yellow  color, cloudy casts, specific gravity 1.027, positive for esterase,  negative  for nitrates.  White cell count on microscopic: 11 to 20, many epithelial  cells, hyaline casts.   Abdominal x-ray compatible with early small bowel obstruction; cardiomegaly  and chronic pulmonary obstructive disease.   ASSESSMENT AND PLAN:  Ms. Dillen is a 77 year old white female with history  of hypertension, diet controlled diabetes mellitus, cardiomegaly, presenting  with acute onset of nausea and vomiting with abdominal pain this afternoon  after eating a [place] sandwich.  No fever, no chills. She does have an  elevated white cell count and an elevated BUN and a positive urinalysis  without urinary symptoms.  She has a colostomy secondary to colectomy for  colon cancer in 1997.  X-ray of the abdomen showed partial early small bowel  obstruction.   1.  Abdominal pain/nausea and vomiting.  Differentials include small bowel      obstruction versus gastritis.  Will keep patient n.p.o.  Intravenous      fluids. Will do a CT scan of the abdomen and pelvis to further evaluate      the abdominal pain.  Will hold off NG tube placement at this point.  No      gross distention after the x-ray.  Phenergan 12.5 mg q.4h p.r.n. nausea      and vomiting.  Dilaudid 4 mg IV q.4h p.r.n. abdominal pain.  Will check      amylase, lipase, will check antigenic CEA.   1.  Positive urinalysis. Patient received ciprofloxacin antibiotic in the      emergency department. Would continue with this and repeat urinalysis and      culture.  Of note is that many bacille were found on the urine.   1.  Hypertension.  Blood pressure is elevated, at this point will recheck      blood pressure and if still elevated will give Norvasc p.o. added to      patient's medication.   1.  Hyperlipidemia.  Will continue with Tricor with monitoring of the liver.  2.  History of diabetes mellitus, will check CBG t.i.d. Check hemoglobin      A1C. 3.  Elevated BUN, hopefully with rehydration this will resolve.  4.   History of cardiomegaly but no history of congestive heart failure as      per patient.  Will given gentle hydration.  1.  History of carotid stenosis.  Patient has been having serial Dopplers  and is stable.  2.  History of smoking.  Nicotine patch.  3.  Deep venous thrombosis prophylaxis.  Lovenox 40 mg SQ daily.       MBB/MEDQ  D:  03/04/2004  T:  03/04/2004  Job:  [id number]

## 2010-10-28 NOTE — Consult Note (Signed)
NAME:  Monica Rhodes, Monica Rhodes              ACCOUNT NO.:  [id number]   MEDICAL RECORD NO.:  [id number]          PATIENT TYPE:  INP   LOCATION:  5506                         FACILITY:  [facility]   PHYSICIAN:  Sharlet Salina T. Hoxworth, M.D.DATE OF BIRTH:  [birth year]   DATE OF CONSULTATION:  11/28/2005  DATE OF DISCHARGE:                                   CONSULTATION   CHIEF COMPLAINT:  Abdominal pain, nausea, vomiting.   PRESENT ILLNESS:  I was asked by Dr. Angelena Sole tonight to evaluate Ms. Matuska.  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 Alfonse Ras 10 years ago.  She has  had no evidence of recurrence on follow up and has had regular  colonoscopies.  She does have a history of several episodes of small bowel  obstruction requiring hospitalization over the last several years that have  resolved nonoperatively.  She was admitted to the hospital service on November 26, 2005 with a 24-hour history of quite severe diffuse crampy abdominal  pain, nausea and vomiting.  She had minimal output from her stoma for a  couple of days.  The patient hospitalized treated with bowel rest, pain  medication and nausea medication.  She has continued to have plain x-ray  evidence of bowel obstruction and this evening underwent a CT scan of the  abdomen indicating incarcerated parastomal hernia as described below.  She  has noted some swelling around her stoma for several months.   PAST MEDICAL HISTORY:  Surgery significant only as above.  Medically, she is  followed for diabetes oral agent controlled, hypertension and COPD.   MEDICATIONS AT HOME:  Clonidine 0.2 mg twice daily, digoxin 0.125 mg daily,  Lasix 20 mg daily, Tricor 145 mg daily.   ALLERGIES:  History of reaction to PHENERGAN.   SOCIAL HISTORY:  She has smoked about a half-pack of cigarettes up until  this hospitalization.   No alcohol.  Lives with her son.   FAMILY HISTORY:  Noncontributory.   REVIEW OF SYSTEMS:  GENERAL:  Weight stable.  No fever or chills.  HEENT:  No vision, hearing or swallowing problems.  RESPIRATORY:  She has somewhat  chronic cough and some dyspnea on exertion.  CARDIAC:  Denies chest pain,  palpitations, history of heart disease.  ABDOMEN:  GI as above.  GU:  Negative.   PHYSICAL EXAMINATION:  VITAL SIGNS:  Temperature is 98, heart rate 71,  respirations 18, blood pressure 182/70.  GENERAL:  She is an elderly fairly thin white female in no acute distress.  SKIN:  Warm, dry.  No rash or infections.  HEENT:  No mass or thyromegaly.  Sclerae nonicteric.  Oropharynx clear.  LUNGS:  Some mild upper airway congestion.  No increased work of breathing.  CARDIAC:  Regular rate and rhythm.  No murmurs, no edema.  ABDOMEN:  Mildly distended.  Stoma present left lower quadrant with empty  ostomy bag.  There is a good sized parastomal hernia that is tender.  It  is  only partially reducible.  Remainder of abdomen is soft and mildly tender.  Healed low midline incision without hernias.  Perineum negative.  EXTREMITIES:  Superficial varicosities, trace edema lower extremities.  NEUROLOGIC:  Alert, oriented.  Motor and sensory exam is grossly normal.   LABORATORY:  CBC, electrolytes, LFTs normal.  Chest x-ray shows evidence of  COPD, mild cardiomegaly, questionable left lower lobe infiltrate.  CT scan  abdomen and pelvis reviewed which shows a good size parastomal hernia  containing small-bowel with point of obstruction in the hernia, colon is  decompressed.  The proximal bowel is markedly distended.  There is some  ascites present as well.   ASSESSMENT/PLAN:  Small-bowel obstruction secondary to incarcerated  parastomal hernia.  She has significant bowel distension and a tender  hernia.  I believe will require emergency repair to a laparotomy incision.  This was discussed with the patient and  her daughter.  The need for the  procedure and major risks of bleeding, infection, cardiorespiratory  complications were discussed and understood. We will plan to proceed with  emergency repair.      Lorne Skeens. Hoxworth, M.D.  Electronically Signed     BTH/MEDQ  D:  11/28/2005  T:  11/29/2005  Job:  [id number]

## 2010-10-28 NOTE — Discharge Summary (Signed)
Monica Rhodes, Monica Rhodes              ACCOUNT NO.:  [id number]   MEDICAL RECORD NO.:  [id number]          PATIENT TYPE:  INP   LOCATION:  1521                         [facility]AC[facility]   PHYSICIAN:  Vikki Ports, MDDATE OF BIRTH:  [birth year]   DATE OF ADMISSION:  03/27/2005  DATE OF DISCHARGE:  03/29/2005                                 DISCHARGE SUMMARY   ADMISSION DIAGNOSIS:  Small bowel obstruction.   DISCHARGE DIAGNOSIS:  Small bowel obstruction.   CONDITION ON DISCHARGE:  Good and improved.   ADMITTING PHYSICIAN:  Lovie Chol.   PLANS FOR FOLLOW-UP:  P.r.n.   DISPOSITION:  Discharged to home.   HISTORY OF PRESENT ILLNESS:  The patient is a very pleasant 77 year old  female who underwent APRIL in 1997 for rectal cancer and now presents with  nausea and vomiting for a 1-day history, minimal ostomy output. NG tube had  been placed in the emergency room and abdominal series was consistent with a  partial small-bowel obstruction. She was admitted for NG tube decompression  and clinical follow-up.   HOSPITAL COURSE:  The patient was admitted, had NG tube placed, was followed  for 24 hours. On hospital day #1 NG tube drainage was minimal. The patient  was having significant gas and stool in her ostomy bag. She was started on  clears with her NG tube clamped, which she tolerated well. NG tube was  pulled. She was tolerating clear liquids by hospital day #2. She was  advanced to regular diet which she tolerated, and she was discharged home.  Follow-up is with me on a p.r.n. basis. medications at discharge are  unchanged from admission and include Catapres, Lanoxin, Lasix, alprazolam,  HCl, and Tricor. Allergies are to [facility] only.      Vikki Ports, MD  Electronically Signed     KRH/MEDQ  D:  05/03/2005  T:  05/03/2005  Job:  [id number]

## 2010-10-28 NOTE — Consult Note (Signed)
NAME:  Monica Rhodes, Monica Rhodes              ACCOUNT NO.:  [id number]   MEDICAL RECORD NO.:  [id number]          PATIENT TYPE:  INP   LOCATION:  5151                         FACILITY:  [facility]   PHYSICIAN:  Ollen Gross. Vernell Morgans, M.D. DATE OF BIRTH:  [birth year]   DATE OF CONSULTATION:  03/04/2004  DATE OF DISCHARGE:                                   CONSULTATION   SURGERY CONSULTATION:   HISTORY OF PRESENT ILLNESS:  Ms. Monica Rhodes is a 77 year old white female who  has a history of rectal cancer and a colostomy performed by Dr. Luan Pulling  in the past.  She presents with abdominal pain, nausea, and vomiting since  Wednesday afternoon.  She has felt bloated but actually feels a little bit  better today and feels as though she has had some stool and air come out of  her colostomy.  She otherwise denies fevers, chills, chest pain, shortness  of breath, or dysuria.   REVIEW OF SYSTEMS:  Other review of systems unremarkable.   PAST MEDICAL HISTORY:  Her past medical history significant for diabetes,  hypertension, rectal cancer, carotid stenosis as well.   PAST SURGICAL HISTORY:  Past surgical history significant for a colectomy  and colostomy and colonoscopy.   MEDICATIONS AT HOME:  Prazosin, Lasix, Tricor, Lanoxin, and clonidine.   ALLERGIES:  No known drug allergies.   SOCIAL HISTORY:  She denies the use of alcohol or tobacco products.   FAMILY HISTORY:  Noncontributory.   PHYSICAL EXAMINATION:  GENERAL:  She is a well-developed, well-nourished  white female in no acute distress.  SKIN:  Warm and dry with no jaundice.  EYES:  Her extraocular muscles are intact; pupils equal, round, react to  light; sclerae are nonicteric.  LUNGS:  Her lungs are clear bilaterally with no use of accessory respiratory  muscles.  HEART:  Heart has a regular rate and rhythm with an impulse in the left  chest.  ABDOMEN:  Soft but distended with no palpable mass or hepatosplenomegaly.  She does have some air and  stool in her colostomy bag.  EXTREMITIES:  No cyanosis, clubbing, or edema.  PSYCHOLOGICAL:  She is alert and oriented x3 with no evidence of any type of  anxiety or depression.   LABORATORY DATA:  On review of her lab work her white count was elevated to  13,000.  On reviewing her CT scan it was consistent for what looked like a  high-grade small-bowel obstruction and probably a parastomal hernia but no  evidence of obstruction at the stoma site.   ASSESSMENT AND PLAN:  This is a 77 year old white female with what appears  to be at least a high-grade partial small-bowel obstruction.  It appears as  though over the last day or so she has gotten a little bit better and has  passed some stool and air into her bag.  I think to give her the chance that  this resolving I would recommend strict bowel rest with NG tube suctioning  and we will monitor her exam closely and follow up with some abdominal x-  rays in the morning.  If she improves she may not require surgery but if she  does not show any improvement in the next day or so then she may come to  require exploration to fix the source of obstruction.  I have discussed this  with her and her family and they understand and are agreeable to this  treatment plan.  I will continue to follow her closely and we appreciate the  opportunity to help take care of this patient.       PST/MEDQ  D:  03/04/2004  T:  03/05/2004  Job:  [id number]

## 2010-10-28 NOTE — Discharge Summary (Signed)
NAME:  Monica Rhodes, Monica Rhodes              ACCOUNT NO.:  [id number]   MEDICAL Rhodes NO.:  [id number]          PATIENT TYPE:  INP   LOCATION:  5505                         FACILITY:  [facility]   PHYSICIAN:  Lonia Blood, M.D.       DATE OF BIRTH:  15-May-1928   DATE OF ADMISSION:  11/26/2005  DATE OF DISCHARGE:                                 DISCHARGE SUMMARY   INTERIM DISCHARGE SUMMARY:   DIAGNOSIS:  1.  Small bowel obstruction and parastomal hernia  status post laparotomy      with lysis of adhesions and repair of parastomal hernia by Dr. Glenna Fellows.  2.  Postoperative ileus.  3.  Chronic obstructive pulmonary disease.  4.  Pneumonia.  5.  Postoperative delirium, resolved.  6.  Hypertension.  7.  Postoperative hypertensive crisis, improved.  8.  History of colon cancer status post resection of colostomy and radiation      therapy.  9.  Tobacco abuse.  10. Diet-controlled diabetes mellitus.   DISCHARGE MEDICATIONS:  His discharge medications will be dictated at the  time of the actual discharge of this patient.   PROCEDURES:  1.  November 26, 2005, the patient underwent an abdominal two views that showed      COPD, ileus versus partial small bowel obstruction.  2.  November 28, 2005, the patient underwent a computed tomography scan of her      abdomen that showed the presence of distal small bowel obstruction and      incarcerated far parastomal hernia, ascites, and peritoneal nodularity,      also cholelithiasis was noted.  3.  November 29, 2005, the patient underwent a laparotomy with hernia repair and      lysis of adhesions.  4.  November 30 1005, the patient underwent head CT that showed no acute      intracranial abnormality.   CONSULTATIONS DURING THIS ADMISSION:  1.  Jordan Hawks Elnoria Howard, M.D. from gastroenterology.  2.  [facility], Dr. Glenna Fellows.   HISTORY AND PHYSICAL:  For admission history and physical refer to the  dictated H&P done on November 26, 2005,  by Dr. Hadley Pen.  Briefly, Monica Rhodes is a  77 year old woman with a history of colon cancer status post resection with  colostomy who presented to [facility] with  complaints of abdominal pain and inability to pass gas.  She was admitted  with a presumed diagnosis of small bowel obstruction.   HOSPITAL COURSE:  1.  Small bowel obstruction.  Monica Rhodes was admitted to the regular floor      of [facility] where she was started on bowel rest and      intravenous fluids.  The patient's abdominal exam has worsened over the      next 48 hours after admission and on November 28, 2005, a computer      tomography scan of for abdomen showed a possible parastomal incarcerated      hernia.  On November 29, 2005, the patient underwent emergent laparotomy  with lysis of adhesions and parastomal hernia repair.  The postoperative      course of this patient was complicated with an ileus as well as with      acute delirium and a hypertensive crisis.  Monica Rhodes was managed with      repeat doses of haloperidol as well as intravenous doses of metoprolol      and hydralazine to control her blood pressure.   1.  COPD with acute bronchitis versus pneumonia.  Monica Rhodes had her      antibiotics adjusted during this hospitalization.  She was started      initially on Avelox which was later switched Zosyn intravenously.  At      the time of my dictation, the patient is afebrile and her course of      antibiotic is ongoing.   1.  Hypertensive with significant hypertensive crisis. Currently, at the      time of my dictation, Monica Rhodes is on intravenous metoprolol and      intravenous hydralazine as well as on a clonidine patch and her blood      pressure is currently fairly well controlled.      Lonia Blood, M.D.  Electronically Signed     SL/MEDQ  D:  12/02/2005  T:  12/02/2005  Job:  [id number]   cc:   Renaye Rakers, M.D.  Fax: [phone]   OZHYQM VHQ IONG, M.D.  Fax:  [phone]   Lorne Skeens. Hoxworth, M.D.  [address]  [place]  [place] [address]

## 2010-10-28 NOTE — Op Note (Signed)
NAME:  Monica Rhodes, Monica Rhodes              ACCOUNT NO.:  [id number]   MEDICAL RECORD NO.:  [id number]          PATIENT TYPE:  INP   LOCATION:  5506                         FACILITY:  [facility]   PHYSICIAN:  Sharlet Salina T. Hoxworth, M.D.DATE OF BIRTH:  09-14-1927   DATE OF PROCEDURE:  11/29/2005  DATE OF DISCHARGE:                                 OPERATIVE REPORT   PREOPERATIVE DIAGNOSIS:  Small-bowel obstruction and parastomal hernia.   POSTOPERATIVE DIAGNOSIS:  Small-bowel obstruction secondary to single  adhesive band and parastomal hernia.   SURGICAL PROCEDURES:  Laparotomy with lysis of adhesions for small bowel  obstruction and repair of parastomal hernia.   SURGEON:  Lorne Skeens. Hoxworth, M.D.   ANESTHESIA:  General.   BRIEF HISTORY:  Monica Rhodes is a 77 year old female, 10 years status post  abdominoperineal resection for cancer the rectum.  She has had several  episodes of bowel obstruction in the past year requiring hospitalization.  She now is admitted with 3 days of persistent nausea, vomiting and abdominal  pain.  Plain x-ray and CT scan show small-bowel obstruction with the  apparent point of obstruction in a large left lower quadrant parastomal  hernia.  Laparotomy and repair of her hernia have been recommended and  accepted.  The nature of the procedure and its indications and risks of  bleeding, infection, cardiorespiratory complications and a recurrent hernia  have been discussed and understood..  She is now brought to the operating  room for this procedure.   DESCRIPTION OF PROCEDURE:  The patient was brought to the operating room and  placed in supine position on the operating table.  General endotracheal  anesthesia was induced.  She was already on broad-spectrum antibiotics and  Lovenox.  PAS were in placed.  Ostomy device was removed and the abdomen  sterilely prepped and draped and ostomy isolated with an Ioban drape.  The  previous low midline incision skirting the  umbilicus was used and dissection  was carried down through the subcutaneous tissue and midline fascia and the  peritoneum entered.  There was a moderate amount of straw-colored ascites  that was suctioned.  There were markedly dilated loops of small bowel.  Omental adhesions from the anterior abdominal wall and around the stoma in  the left lower quadrant were taken down sharply and with cautery.  There  were fortunately not many adhesions to the small bowel.  The markedly  dilated proximal small bowel was traced distally.  This was brought down out  of her parastomal hernia without difficulty and there was no adhesion or  incarceration here.  The dilated bowel was then traced down distally into  the pelvis..  At this point, there was a dense adhesive band from the  mesentery of the left colon, where it looped back up to the colostomy, down  to the retroperitoneum, which had trapped the small bowel with near complete  obstruction at this point.  This was obviously a  chronic point of  obstruction as well.  This adhesive band was lysed.  This freed the  obstruction.  There was still, however, an opening  underneath the mesentery  of the colon where it looped back from the left and sigmoid colon up to the  ostomy, where the small bowel had come through and most of this was actually  up on the left side of the colon.  This was brought back down to the right  side and then the colon could then lie back against the left side of the  abdomen and the small bowel and back centrally in its normal position.  At  this point I could trace from the ligament of Treitz down to the ileocecal  valve with no entrapment of the mesentery and no further points of  obstruction.  The parastomal hernia, although there was a large subcutaneous  pocket, consisted of only about a 4-cm defect to the fascia above the stoma  with a fairly strong fascial edges.  This was closed with interrupted 0  Novofil sutures,  snugging around the colostomy to a fingertip.  Following  this, the abdomen was irrigated and the small bowel and omentum put back in  anatomic position.  The midline fascia was closed with running #1 Novofil  begun at either end of the incision and tied centrally with a several 0  Novofil interrupted sutures intermittently.  The subcutaneous tissue was  irrigated and skin closed with staples.  Sponge, needle and instrument count  were correct.  Dry sterile dressings were applied and the patient taken to  recovery in good condition.      Lorne Skeens. Hoxworth, M.D.  Electronically Signed     BTH/MEDQ  D:  11/29/2005  T:  11/29/2005  Job:  [id number]

## 2011-03-08 LAB — DIFFERENTIAL
Basophils Absolute: 0
Basophils Absolute: 0.1
Basophils Relative: 0
Basophils Relative: 1
Eosinophils Absolute: 0
Eosinophils Absolute: 0
Eosinophils Relative: 0
Lymphocytes Relative: 13
Lymphs Abs: 1.8
Monocytes Absolute: 1.1 — ABNORMAL HIGH
Monocytes Absolute: 1.2 — ABNORMAL HIGH
Monocytes Relative: 9
Neutro Abs: 10.2 — ABNORMAL HIGH
Neutro Abs: 13.1 — ABNORMAL HIGH
Neutrophils Relative %: 77
Neutrophils Relative %: 85 — ABNORMAL HIGH

## 2011-03-08 LAB — CBC
HCT: 34.3 — ABNORMAL LOW
Hemoglobin: 11.6 — ABNORMAL LOW
MCHC: 33.9
MCHC: 34.2
MCV: 87.4
MCV: 88.5
Platelets: 207
RBC: 3.88
RDW: 12.9
RDW: 13
WBC: 13.3 — ABNORMAL HIGH

## 2011-03-08 LAB — BASIC METABOLIC PANEL WITH GFR
CO2: 29
Calcium: 8.7
Glucose, Bld: 112 — ABNORMAL HIGH
Potassium: 3.4 — ABNORMAL LOW
Sodium: 137

## 2011-03-08 LAB — CULTURE, BLOOD (ROUTINE X 2)
Culture: NO GROWTH
Culture: NO GROWTH

## 2011-03-08 LAB — BASIC METABOLIC PANEL
BUN: 28 — ABNORMAL HIGH
Chloride: 98
Creatinine, Ser: 1.2
GFR calc Af Amer: 53 — ABNORMAL LOW
GFR calc non Af Amer: 44 — ABNORMAL LOW

## 2011-03-08 LAB — POCT I-STAT, CHEM 8
BUN: 34 — ABNORMAL HIGH
Calcium, Ion: 1.13
Chloride: 102
Creatinine, Ser: 1.5 — ABNORMAL HIGH
Glucose, Bld: 132 — ABNORMAL HIGH
HCT: 35 — ABNORMAL LOW
Hemoglobin: 11.9 — ABNORMAL LOW
Potassium: 4
Sodium: 138
TCO2: 28

## 2011-03-08 LAB — HEMOGLOBIN A1C
Hgb A1c MFr Bld: 6.7 — ABNORMAL HIGH
Mean Plasma Glucose: 161

## 2011-03-08 LAB — B-NATRIURETIC PEPTIDE (CONVERTED LAB): Pro B Natriuretic peptide (BNP): 181 — ABNORMAL HIGH

## 2011-03-08 LAB — DIGOXIN LEVEL: Digoxin Level: 0.5 — ABNORMAL LOW

## 2011-03-09 LAB — BASIC METABOLIC PANEL
CO2: 32
Chloride: 105
Creatinine, Ser: 0.89
GFR calc Af Amer: >60
Sodium: 144

## 2011-03-09 LAB — CBC
Hemoglobin: 11.3 — ABNORMAL LOW
MCHC: 35
MCV: 87.6
RBC: 3.68 — ABNORMAL LOW
WBC: 6.9

## 2011-03-17 LAB — CBC
Hemoglobin: 13.3
MCHC: 33.5
MCV: 87
RBC: 4.55
RDW: 13.2

## 2011-03-17 LAB — BASIC METABOLIC PANEL
CO2: 25
Chloride: 106
Creatinine, Ser: 0.96
GFR calc Af Amer: >60
Glucose, Bld: 132 — ABNORMAL HIGH
Sodium: 137

## 2011-04-12 ENCOUNTER — Inpatient Hospital Stay (HOSPITAL_COMMUNITY)
Admission: EM | Admit: 2011-04-12 | Discharge: 2011-04-27 | DRG: 388 | Disposition: A | Discharge status: Home / Self Care | Payer: Medicare Other | Attending: Internal Medicine | Admitting: Internal Medicine

## 2011-04-12 ENCOUNTER — Emergency Department (HOSPITAL_COMMUNITY): Payer: Medicare Other

## 2011-04-12 DIAGNOSIS — I10 Essential (primary) hypertension: Secondary | ICD-10-CM | POA: Diagnosis present

## 2011-04-12 DIAGNOSIS — E46 Unspecified protein-calorie malnutrition: Secondary | ICD-10-CM | POA: Diagnosis present

## 2011-04-12 DIAGNOSIS — I739 Peripheral vascular disease, unspecified: Secondary | ICD-10-CM | POA: Diagnosis present

## 2011-04-12 DIAGNOSIS — R7309 Other abnormal glucose: Secondary | ICD-10-CM | POA: Diagnosis present

## 2011-04-12 DIAGNOSIS — B952 Enterococcus as the cause of diseases classified elsewhere: Secondary | ICD-10-CM | POA: Diagnosis present

## 2011-04-12 DIAGNOSIS — Z933 Colostomy status: Secondary | ICD-10-CM

## 2011-04-12 DIAGNOSIS — J449 Chronic obstructive pulmonary disease, unspecified: Secondary | ICD-10-CM | POA: Diagnosis present

## 2011-04-12 DIAGNOSIS — I6529 Occlusion and stenosis of unspecified carotid artery: Secondary | ICD-10-CM | POA: Diagnosis present

## 2011-04-12 DIAGNOSIS — E876 Hypokalemia: Secondary | ICD-10-CM | POA: Diagnosis present

## 2011-04-12 DIAGNOSIS — K56609 Unspecified intestinal obstruction, unspecified as to partial versus complete obstruction: Secondary | ICD-10-CM

## 2011-04-12 DIAGNOSIS — Z9049 Acquired absence of other specified parts of digestive tract: Secondary | ICD-10-CM

## 2011-04-12 DIAGNOSIS — J4489 Other specified chronic obstructive pulmonary disease: Secondary | ICD-10-CM | POA: Diagnosis present

## 2011-04-12 DIAGNOSIS — I214 Non-ST elevation (NSTEMI) myocardial infarction: Secondary | ICD-10-CM | POA: Diagnosis present

## 2011-04-12 DIAGNOSIS — E870 Hyperosmolality and hypernatremia: Secondary | ICD-10-CM | POA: Diagnosis present

## 2011-04-12 DIAGNOSIS — D638 Anemia in other chronic diseases classified elsewhere: Secondary | ICD-10-CM | POA: Diagnosis present

## 2011-04-12 DIAGNOSIS — Z23 Encounter for immunization: Secondary | ICD-10-CM

## 2011-04-12 DIAGNOSIS — E785 Hyperlipidemia, unspecified: Secondary | ICD-10-CM | POA: Diagnosis present

## 2011-04-12 DIAGNOSIS — N390 Urinary tract infection, site not specified: Secondary | ICD-10-CM

## 2011-04-12 DIAGNOSIS — Z85038 Personal history of other malignant neoplasm of large intestine: Secondary | ICD-10-CM

## 2011-04-12 DIAGNOSIS — F17200 Nicotine dependence, unspecified, uncomplicated: Secondary | ICD-10-CM | POA: Diagnosis present

## 2011-04-12 DIAGNOSIS — Z888 Allergy status to other drugs, medicaments and biological substances status: Secondary | ICD-10-CM

## 2011-04-12 HISTORY — DX: Essential (primary) hypertension: I10

## 2011-04-12 LAB — URINE MICROSCOPIC-ADD ON

## 2011-04-12 LAB — URINALYSIS, ROUTINE W REFLEX MICROSCOPIC
Glucose, UA: NEGATIVE mg/dL
Protein, ur: 30 mg/dL — AB
Specific Gravity, Urine: 1.017 (ref 1.005–1.030)

## 2011-04-12 LAB — DIFFERENTIAL
Basophils Absolute: 0 10*3/uL (ref 0.0–0.1)
Basophils Relative: 0 % (ref 0–1)
Eosinophils Absolute: 0 10*3/uL (ref 0.0–0.7)
Monocytes Absolute: 0.5 10*3/uL (ref 0.1–1.0)
Monocytes Relative: 4 % (ref 3–12)

## 2011-04-12 LAB — CBC
MCH: 29.5 pg (ref 26.0–34.0)
MCHC: 33.5 g/dL (ref 30.0–36.0)
Platelets: 212 10*3/uL (ref 150–400)
RDW: 12.8 % (ref 11.5–15.5)

## 2011-04-12 LAB — COMPREHENSIVE METABOLIC PANEL
AST: 17 U/L (ref 0–37)
Albumin: 3.8 g/dL (ref 3.5–5.2)
Calcium: 10.1 mg/dL (ref 8.4–10.5)
Creatinine, Ser: 1.2 mg/dL — ABNORMAL HIGH (ref 0.50–1.10)
GFR calc non Af Amer: 41 mL/min — ABNORMAL LOW (ref >90)
Sodium: 138 mEq/L (ref 135–145)
Total Protein: 6.9 g/dL (ref 6.0–8.3)

## 2011-04-12 LAB — LACTIC ACID, PLASMA: Lactic Acid, Venous: 1.1 mmol/L (ref 0.5–2.2)

## 2011-04-13 ENCOUNTER — Encounter (HOSPITAL_COMMUNITY): Payer: Self-pay | Admitting: Radiology

## 2011-04-13 ENCOUNTER — Inpatient Hospital Stay (HOSPITAL_COMMUNITY): Payer: Medicare Other

## 2011-04-13 LAB — CBC
Hemoglobin: 13.5 g/dL (ref 12.0–15.0)
MCH: 29.2 pg (ref 26.0–34.0)
MCHC: 33.1 g/dL (ref 30.0–36.0)
Platelets: 218 10*3/uL (ref 150–400)
RDW: 12.9 % (ref 11.5–15.5)

## 2011-04-13 LAB — CARDIAC PANEL(CRET KIN+CKTOT+MB+TROPI)
CK, MB: 3 ng/mL (ref 0.3–4.0)
CK, MB: 5.8 ng/mL — ABNORMAL HIGH (ref 0.3–4.0)
Total CK: 52 U/L (ref 7–177)
Troponin I: <0.3 ng/mL (ref <0.30)

## 2011-04-13 LAB — GLUCOSE, CAPILLARY
Glucose-Capillary: 139 mg/dL — ABNORMAL HIGH (ref 70–99)
Glucose-Capillary: 129 mg/dL — ABNORMAL HIGH (ref 70–99)
Glucose-Capillary: 147 mg/dL — ABNORMAL HIGH (ref 70–99)

## 2011-04-13 LAB — CEA: CEA: 2.6 ng/mL (ref 0.0–5.0)

## 2011-04-13 LAB — BASIC METABOLIC PANEL
BUN: 20 mg/dL (ref 6–23)
CO2: 31 mEq/L (ref 19–32)
Calcium: 9.2 mg/dL (ref 8.4–10.5)
Creatinine, Ser: 1.14 mg/dL — ABNORMAL HIGH (ref 0.50–1.10)
GFR calc non Af Amer: 44 mL/min — ABNORMAL LOW (ref >90)
Glucose, Bld: 131 mg/dL — ABNORMAL HIGH (ref 70–99)
Sodium: 139 mEq/L (ref 135–145)

## 2011-04-13 LAB — HEMOGLOBIN A1C: Mean Plasma Glucose: 154 mg/dL — ABNORMAL HIGH (ref <117)

## 2011-04-13 MED ORDER — IOHEXOL 300 MG/ML  SOLN
80.0000 mL | Freq: Once | INTRAMUSCULAR | Status: AC | PRN
  Administered 2011-04-13: 80 mL via INTRAVENOUS

## 2011-04-13 NOTE — H&P (Signed)
Monica Rhodes              ACCOUNT NO.:  [id number]  MEDICAL RECORD NO.:  [id number]  LOCATION:  5151                         FACILITY:  [facility]  PHYSICIAN:  Vania Rea, M.D. DATE OF BIRTH:  [birth year]  DATE OF ADMISSION:  04/13/2011 DATE OF DISCHARGE:                             HISTORY & PHYSICAL   PRIMARY CARE PHYSICIAN:  Renaye Rakers, MD  CHIEF COMPLAINT:  Abdominal pain and swelling since Wednesday morning, yesterday.  HISTORY OF PRESENT ILLNESS:  This is an 77 year old Caucasian lady with a remote history of partial colectomy with permanent colostomy for cancer of the colon about 15 years ago, who had surgery for a small- bowel obstruction related to adhesions and parastomal hernia about 5 years ago, but who has since been in fair state of health until 2 days ago that she has noted she has had no output from her colostomy since emptying it on Tuesday morning.  On Wednesday morning, she noted she began to have a colicky abdominal pain which she described as intermittent, sometimes severe and sometimes not associated with very mild, but progressive swelling of her abdomen.  Eventually, as the pain persisted and she felt more and more ill, she called an ambulance and was brought to the emergency room.  In the emergency room, the patient was evaluated and had a CT scan of the abdomen done which revealed evidence of small-bowel obstruction and after contacting the surgeons, the hospitalists were contacted for primary admission.  The patient currently is a little confused and woozy, having received pain medication, and therefore unable to give a complete history. However, she denies any fever, cough, or cold.  She denies chest pain or shortness of breath.  She denies nausea or vomiting.  She reports that she has been very selective with what she eats because of the difficulty she has been having, but has definitely not been vomiting and has had no output from the  stoma for 2 days.  PAST MEDICAL HISTORY:  Diabetes, hypertension, and reports she has an enlarged heart, but no history of heart failure, remote history of colon cancer, partial colectomy, and bowel obstruction as noted above.  MEDICATIONS: 1. Clonidine 0.2 mg twice daily. 2. Digoxin 125 mcg daily. 3. Metformin 250 mg daily. 4. Prazosin 2 mg daily. 5. Tekturna 150-225 daily. 6. Fenofibrate 160 mg daily.  ALLERGIES:  To PHENERGAN.  SOCIAL HISTORY:  She smokes half a pack of cigarettes per day.  Denies alcohol or illicit drug use.  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  REVIEW OF SYSTEMS:  Other than noted above, unremarkable.  FAMILY HISTORY:  Unable to obtain further.  PHYSICAL EXAMINATION:  GENERAL:  Pleasant but weakened, drowsy elderly Caucasian lady, lying in bed, looks quite dehydrated. VITAL SIGNS:  Temperature is 98.1, her pulse is 86, respirations 19, blood pressure 213/69.  She is saturating at 95% on room air. HEENT:  Her pupils are round and equal.  Mucous membranes pink and dry. No cervical lymphadenopathy.  She does have a prominent but small thyroid.  No nodules.  She has a  right-sided carotid bruit. CHEST:  Clear to auscultation bilaterally. CARDIOVASCULAR SYSTEM:  Regular rhythm.  1/6 systolic murmur. ABDOMEN:  Distended, but no masses are felt.  She has decreased bowel sounds and she is diffusely tender.  She has a colostomy bag in the left lower quadrant, but the bag is empty. EXTREMITIES:  Without edema.  She has arthritic deformities of the knees and ankles, most marked at the knees with the left valgus deformity of the knee. CENTRAL NERVOUS SYSTEM:  Cranial nerves II-XII are grossly intact.  She has no focal lateralizing signs.  LABS:  Her white count is 12.4, hemoglobin 13.5, platelets 212.  Lactic acid is normal at 1.1.  Her sodium is 138, potassium 3.7,  chloride 98, CO2 30, glucose 169, BUN 24, creatinine 1.2.  Her calcium is 10.1.  Her liver functions are unremarkable.  Her lipase is normal.  Urinalysis shows clear urine, 30 proteins, moderate amount of leukocyte esterase. Urinalysis shows 7-10 white cells and rare bacteria.  Her CT scan of the abdomen and pelvis shows a decompressed distal ileum with wall thickening, suggesting possible inflammatory stricture of proximal small bowel obstruction.  She has a partial left colectomy with the left lower quadrant colostomy, extensive calcific atherosclerotic changes throughout the abdominal aorta, poor flow in the external iliac artery, small amount of upper abdominal ascites, small pericardial effusion, gallbladder sludge noted.  ASSESSMENT: 1. Acute small bowel obstruction. 2. Hypertension, uncontrolled. 3. Diabetes type 2. 4. Dehydration.  PLAN: 1. We will admit this lady for intravenous hydration and blood    pressure control. Dr. Carolynne Edouard has been contacted and he will be evaluating     this patient later in case she will be in need of surgery 2.  Since she will be n.p.o. we will attempt control  of her blood pressure      with IV Lopressor, intravenous Vasotec and  p.r.n. hydralazine.   3. We will check her digoxin level, but will   withhold digoxin for the time being.    We have not encountered a clear indication for digoxin in this lady with    a normal ejection fraction, and she denies a history of atrial fibrillation,    but this will need to be clarified later.   4. Other plans as per orders.     Vania Rea, M.D.     LC/MEDQ  D:  04/13/2011  T:  04/13/2011  Job:  [id number]  cc:   Renaye Rakers, M.D.  Electronically Signed by Vania Rea M.D. on 04/13/2011 10:16:31 PM

## 2011-04-14 ENCOUNTER — Inpatient Hospital Stay (HOSPITAL_COMMUNITY): Payer: Medicare Other

## 2011-04-14 DIAGNOSIS — I10 Essential (primary) hypertension: Secondary | ICD-10-CM | POA: Diagnosis present

## 2011-04-14 DIAGNOSIS — I214 Non-ST elevation (NSTEMI) myocardial infarction: Secondary | ICD-10-CM

## 2011-04-14 DIAGNOSIS — I059 Rheumatic mitral valve disease, unspecified: Secondary | ICD-10-CM

## 2011-04-14 DIAGNOSIS — Z9049 Acquired absence of other specified parts of digestive tract: Secondary | ICD-10-CM | POA: Insufficient documentation

## 2011-04-14 LAB — CBC
HCT: 42.1 % (ref 36.0–46.0)
Hemoglobin: 13.6 g/dL (ref 12.0–15.0)
MCH: 28.9 pg (ref 26.0–34.0)
MCV: 88.8 fL (ref 78.0–100.0)
Platelets: 255 10*3/uL (ref 150–400)
RBC: 4.69 MIL/uL (ref 3.87–5.11)
RDW: 13.3 % (ref 11.5–15.5)
WBC: 13.4 10*3/uL — ABNORMAL HIGH (ref 4.0–10.5)

## 2011-04-14 LAB — CARDIAC PANEL(CRET KIN+CKTOT+MB+TROPI)
CK, MB: 135.8 ng/mL (ref 0.3–4.0)
Relative Index: 12.5 — ABNORMAL HIGH (ref 0.0–2.5)
Relative Index: 10.7 — ABNORMAL HIGH (ref 0.0–2.5)
Total CK: 595 U/L — ABNORMAL HIGH (ref 7–177)
Total CK: 1087 U/L — ABNORMAL HIGH (ref 7–177)
Troponin I: 5.78 ng/mL (ref <0.30)
Troponin I: 12.48 ng/mL (ref <0.30)
Troponin I: 16.1 ng/mL (ref <0.30)

## 2011-04-14 LAB — BASIC METABOLIC PANEL
BUN: 24 mg/dL — ABNORMAL HIGH (ref 6–23)
CO2: 26 mEq/L (ref 19–32)
Calcium: 8.6 mg/dL (ref 8.4–10.5)
Chloride: 106 mEq/L (ref 96–112)
Creatinine, Ser: 1.07 mg/dL (ref 0.50–1.10)

## 2011-04-14 LAB — HEPARIN ANTI-XA: Heparin LMW: <0.1 IU/mL

## 2011-04-14 LAB — GLUCOSE, CAPILLARY: Glucose-Capillary: 162 mg/dL — ABNORMAL HIGH (ref 70–99)

## 2011-04-14 NOTE — Consult Note (Signed)
Monica Rhodes, SAKAMOTO              ACCOUNT NO.:  [id number]  MEDICAL RECORD NO.:  [id number]  LOCATION:  5151                         FACILITY:  [facility]  PHYSICIAN:  Ollen Gross. Vernell Morgans, M.D. DATE OF BIRTH:  Apr 28, 1928  DATE OF CONSULTATION: DATE OF DISCHARGE:                                CONSULTATION   We have seen Monica Rhodes in consultation by the [facility] to evaluate her for small bowel obstruction.  Monica Rhodes is an 77 year old white female who apparently has a history of couple of abdominal surgeries, the original one was for a rectal cancer for which she had a low anterior resection or an APR, she has a permanent ostomy in her left lower quadrant.  She has been having some decreased output from her ostomy lately and then yesterday developed some abdominal discomfort and distention, this was accompanied with nausea and vomiting.  She denies any fevers or chills.  She denies any chest pain or shortness of breath. She has not had anything out of her ostomy today.  Her past medical history is significant for hypertension, some sort of cardiomyopathy, diabetes, COPD, rectal cancer.  PAST SURGICAL HISTORY:  Significant for either a low anterior resection or APR, and then a subsequent exploratory laparotomy, lysis of adhesions, and repair of peristomal hernia by Dr. Johna Sheriff in 2007.  Her medications include digoxin, clonidine, prazosin, TriCor, Tekturna.  Allergies are to [facility].  SOCIAL HISTORY:  She apparently does smoke cigarettes and denies any alcohol use.  FAMILY HISTORY:  Noncontributory.  PHYSICAL EXAMINATION:  VITAL SIGNS:  Her temp is 98.1, blood pressure is 213/63, pulse of 86. GENERAL:  She is a well-developed, well-nourished, elderly white female, in no acute distress. SKIN:  Warm and dry.  No jaundice. EYES:  Extraocular muscles intact.  Pupils equal, round, and reactive to light.  Sclerae nonicteric.  LUNGS:  Clear bilaterally with no use  of accessory respiratory muscles. HEART:  Regular rate and rhythm with impulse in the left chest. ABDOMEN:  Soft.  She has some moderate distention and normal bowel sounds.  Her ostomy is pink, but there is no output from the ostomy. EXTREMITIES:  No cyanosis, clubbing, or edema.  Good strength in arms and legs.  PSYCHOLOGICALLY:  She is alert and oriented x3 with no evidence of anxiety or depression.  On review of her lab work, her white count is 12.4, creatinine is 1.2, lactate is 1.1.  Her CT scan was suggestive of a small bowel obstruction with a transition point in her pelvis.  ASSESSMENT AND PLAN:  This is an 77 year old white female with possible small bowel obstruction.  I would agree at this point with bowel rest and NG tube suctioning.  We will repeat her abdominal x-rays in the morning over the next couple days if she does not show any sign of improvement, then she may require surgery.  I have discussed this with her and she understands and would like to avoid surgery at all cost.     Ollen Gross. Vernell Morgans, M.D.     PST/MEDQ  D:  04/13/2011  T:  04/13/2011  Job:  [id number]  Electronically Signed by Chevis Pretty III M.D. on  04/14/2011 07:33:08 AM

## 2011-04-14 NOTE — Progress Notes (Deleted)
[facility]   Filed Vitals:   04/13/11 0235  Height: 5\' 1"  (1.549 m)  Weight: 119 lb 15.9 oz (54.43 kg)   No intake or output data in the 24 hours ending 04/14/11 1633  LABS: Basic Metabolic Panel:  Basename 04/14/11 0252 04/13/11 0655  NA 146* 139  K 3.7 3.7  CL 106 100  CO2 26 31  GLUCOSE 149* 131*  BUN 24* 20  CREATININE 1.07 1.14*  CALCIUM 8.6 9.2  MG -- --  PHOS -- --   Liver Function Tests:  Basename 04/12/11 2004  AST 17  ALT 6  ALKPHOS 41  BILITOT 0.3  PROT 6.9  ALBUMIN 3.8    Basename 04/12/11 2004  LIPASE 45  AMYLASE --   CBC:  Basename 04/14/11 0917 04/14/11 0252 04/12/11 2004  WBC 12.4* 13.4* --  NEUTROABS -- -- 11.2*  HGB 13.6 13.2 --  HCT 42.1 40.5 --  MCV 89.8 88.8 --  PLT 242 255 --   Cardiac Enzymes:  Basename 04/14/11 0917 04/14/11 0252 04/13/11 2055  CKTOTAL 1087* 595* 115  CKMB 135.8* 85.2* 5.8*  CKMBINDEX -- -- --  TROPONINI 12.48* 5.78* <0.30   BNP: No results found for this basename: POCBNP:3 in the last 72 hours D-Dimer: No results found for this basename: DDIMER:2 in the last 72 hours Hemoglobin A1C:  Basename 04/13/11 0655  HGBA1C 7.0*   Fasting Lipid Panel: No results found for this basename: CHOL,HDL,LDLCALC,TRIG,CHOLHDL,LDLDIRECT in the last 72 hours Thyroid Function Tests: No results found for this basename: TSH,T4TOTAL,FREET3,T3FREE,THYROIDAB in the last 72 hours  RADIOLOGY: Ct Abdomen Pelvis W Contrast  04/13/2011  *RADIOLOGY REPORT*  Clinical Data: Nominal pain and vomiting  CT ABDOMEN AND PELVIS WITH CONTRAST  Technique:  Multidetector CT imaging of the abdomen and pelvis was performed following the standard protocol during bolus administration of intravenous contrast.  Contrast: 80mL OMNIPAQUE IOHEXOL 300 MG/ML IV SOLN  Comparison: None.  Findings: Mild dependent atelectasis in the lung bases.  Small pericardial effusions similar to previous study.  Small upper abdominal effusion around the upper liver and  spleen.  This is new since the previous study.  Increased density within the gallbladder consistent with sludge.  No gallbladder wall thickening or stones demonstrated.  The liver, spleen, and pancreas are otherwise unremarkable.  No adrenal gland nodules.  Prominent diffuse calcific atherosclerotic changes throughout the abdominal aorta and branch vessels.  Focal significant narrowing of the distal abdominal aorta just before the bifurcation. Decreased flow in the external iliac arteries consistent with significant stenosis. Renal atrophy and parenchymal cysts.  No hydronephrosis. Retroperitoneal lymph nodes are not pathologically enlarged.  No free air or free fluid in the abdomen.  The stomach is normal. Lower abdominal and pelvic small bowel loops are distended with air fluid levels.  The terminal ileum is decompressed.  Thickening of the wall of the terminal ileum suggesting stricture, possibly representing inflammatory bowel disease.  Pelvis:  Partial left colectomy with left lower quadrant colostomy. Stool filled colon without distension or wall thickening.  No free or loculated pelvic fluid collections.  Compressed. The appendix is normal.  Degenerative changes in the lumbar spine with normal alignment.  IMPRESSION:  1.  Decompressed distal ileum with wall thickening suggesting possible inflammatory stricture.  Proximal small bowel obstruction. 2.  Partial left colectomy with left lower quadrant colostomy. 3.  Extensive calcific atherosclerotic changes throughout the abdominal aorta and branch vessels with suggestion of significant stenosis of the distal aorta and iliac bifurcation region.  Poor flow demonstrated in the external iliac arteries. 4.  Small amount of upper abdominal ascites.  Small pericardial effusion. 5.  Gallbladder sludge.  Original Report Authenticated By: Marlon Pel, M.D.   Dg Chest Portable 1 View  04/14/2011  *RADIOLOGY REPORT*  Clinical Data: Shortness of breath.  Hypoxia.   Myocardial infarction.  PORTABLE CHEST - 1 VIEW  Comparison: 12/20/2005  Findings: Considerable bilateral interstitial accentuation is present in both lungs, favoring acute pulmonary edema over atypical pneumonia.  Mild cardiomegaly is present.  Nasogastric tube extends into the stomach.  Thoracic spondylosis noted.  IMPRESSION:  1.  Diffuse prominent interstitial opacity in the lungs favoring interstitial edema over atypical pneumonia. 2.  Mild cardiomegaly.  Original Report Authenticated By: Dellia Cloud, M.D.   Dg Abd 2 Views  04/13/2011  *RADIOLOGY REPORT*  Clinical Data: Follow up small bowel obstruction.  ABDOMEN - 2 VIEW 04/13/2011:  Comparison: CT abdomen and pelvis yesterday.  Findings: Persistent dilation of multiple loops of small bowel throughout the abdomen, demonstrating air fluid levels on the lateral decubitus image, not significantly changed since yesterday. No evidence of free intraperitoneal air.  Contrast material within the renal collecting systems and urinary bladder from the CT yesterday.  Surgical clips in the left mid abdomen and in the pelvis.  Degenerative changes involving the lumbar spine. Nasogastric tube tip in the fundus of the stomach.  IMPRESSION: No significant change since yesterday in the partial small bowel obstruction.  No free intraperitoneal air.  Original Report Authenticated By: Arnell Sieving, M.D.   Dg Abd Portable 1v  04/14/2011  *RADIOLOGY REPORT*  Clinical Data: 77 year old female with small bowel obstruction.  ABDOMEN - 1 VIEW  Comparison: 04/13/2011 and earlier.  Findings: AP portable supine view at 0840 hours.  Decreased gas filled small bowel loops in the lower abdomen and pelvis.  Oral contrast is increased in the right colon.  Multiple surgical clips re-identified in the lower abdomen and pelvis. Stable visualized osseous structures.  Bladder decompressed.  IMPRESSION: Improved bowel gas pattern with decreased small bowel loops and increased  contrast transit to the right colon.  Original Report Authenticated By: Harley Hallmark, M.D.    PHYSICAL EXAM General: Well developed, well nourished, in no acute distress Head: Eyes PERRLA, No xanthomas.   Normal cephalic and atramatic  Lungs: Clear bilaterally to auscultation and percussion. Heart: HRRR S1 S2, No MRG .  Pulses are 2+ & equal.            No carotid bruit. No JVD.  No abdominal bruits. No femoral bruits. Abdomen: Bowel sounds are positive, abdomen soft and non-tender without masses or                  Hernia's noted. Msk:  Back normal, normal gait. Normal strength and tone for age. Extremities: No clubbing, cyanosis or edema.  DP +1 Neuro: Alert and oriented X 3. Psych:  Good affect, responds appropriately  TELEMETRY:  ASSESSMENT AND PLAN:    @ME1 @

## 2011-04-15 ENCOUNTER — Inpatient Hospital Stay (HOSPITAL_COMMUNITY): Payer: Medicare Other

## 2011-04-15 DIAGNOSIS — I248 Other forms of acute ischemic heart disease: Secondary | ICD-10-CM

## 2011-04-15 DIAGNOSIS — I6529 Occlusion and stenosis of unspecified carotid artery: Secondary | ICD-10-CM

## 2011-04-15 LAB — HEPARIN LEVEL (UNFRACTIONATED)
Heparin Unfractionated: 0.41 IU/mL (ref 0.30–0.70)
Heparin Unfractionated: 0.52 IU/mL (ref 0.30–0.70)

## 2011-04-15 LAB — GLUCOSE, CAPILLARY
Glucose-Capillary: 138 mg/dL — ABNORMAL HIGH (ref 70–99)
Glucose-Capillary: 141 mg/dL — ABNORMAL HIGH (ref 70–99)
Glucose-Capillary: 146 mg/dL — ABNORMAL HIGH (ref 70–99)
Glucose-Capillary: 149 mg/dL — ABNORMAL HIGH (ref 70–99)

## 2011-04-15 LAB — COMPREHENSIVE METABOLIC PANEL
ALT: 16 U/L (ref 0–35)
AST: 152 U/L — ABNORMAL HIGH (ref 0–37)
Albumin: 2.9 g/dL — ABNORMAL LOW (ref 3.5–5.2)
Alkaline Phosphatase: 36 U/L — ABNORMAL LOW (ref 39–117)
Chloride: 106 mEq/L (ref 96–112)
Potassium: 3.2 mEq/L — ABNORMAL LOW (ref 3.5–5.1)
Sodium: 148 mEq/L — ABNORMAL HIGH (ref 135–145)
Total Bilirubin: 0.2 mg/dL — ABNORMAL LOW (ref 0.3–1.2)
Total Protein: 5.6 g/dL — ABNORMAL LOW (ref 6.0–8.3)

## 2011-04-15 LAB — URINE CULTURE: Colony Count: 80000

## 2011-04-15 LAB — CARDIAC PANEL(CRET KIN+CKTOT+MB+TROPI): CK, MB: 71.2 ng/mL (ref 0.3–4.0)

## 2011-04-15 LAB — CBC
HCT: 36.3 % (ref 36.0–46.0)
MCHC: 33.3 g/dL (ref 30.0–36.0)
Platelets: 233 10*3/uL (ref 150–400)
RDW: 13.4 % (ref 11.5–15.5)

## 2011-04-15 LAB — MAGNESIUM: Magnesium: 2 mg/dL (ref 1.5–2.5)

## 2011-04-15 MED ORDER — ASPIRIN 81 MG PO CHEW
324.0000 mg | CHEWABLE_TABLET | Freq: Every day | ORAL | Status: DC
  Administered 2011-04-16 – 2011-04-27 (×11): 324 mg via ORAL
  Filled 2011-04-15: qty 1
  Filled 2011-04-15: qty 4
  Filled 2011-04-15: qty 3
  Filled 2011-04-15: qty 1
  Filled 2011-04-15: qty 4

## 2011-04-15 MED ORDER — CLONIDINE HCL 0.1 MG/24HR TD PTWK
0.1000 mg | MEDICATED_PATCH | TRANSDERMAL | Status: DC
  Administered 2011-04-21: 0.1 mg via TRANSDERMAL
  Filled 2011-04-15: qty 1

## 2011-04-15 MED ORDER — AMPICILLIN SODIUM 1 G IJ SOLR
1.0000 g | Freq: Four times a day (QID) | INTRAMUSCULAR | Status: DC
  Administered 2011-04-16 – 2011-04-18 (×10): 1 g via INTRAVENOUS
  Filled 2011-04-15 (×14): qty 1000

## 2011-04-15 MED ORDER — POTASSIUM CL IN DEXTROSE 5% 20 MEQ/L IV SOLN
20.0000 meq | INTRAVENOUS | Status: DC
  Filled 2011-04-15 (×5): qty 1000

## 2011-04-15 MED ORDER — METOPROLOL TARTRATE 1 MG/ML IV SOLN: 5.0000 mg | Freq: Four times a day (QID) | INTRAVENOUS | Status: DC

## 2011-04-15 MED ORDER — MORPHINE SULFATE 2 MG/ML IJ SOLN: 1.0000 mg | INTRAMUSCULAR | Status: DC | PRN

## 2011-04-15 MED ORDER — ENALAPRILAT 1.25 MG/ML IV SOLN
1.2500 mg | Freq: Four times a day (QID) | INTRAVENOUS | Status: DC
  Administered 2011-04-16 – 2011-04-17 (×6): 1.25 mg via INTRAVENOUS
  Filled 2011-04-15 (×12): qty 1

## 2011-04-15 MED ORDER — NITROGLYCERIN IN D5W 200-5 MCG/ML-% IV SOLN
2.0000 ug/min | INTRAVENOUS | Status: DC
  Administered 2011-04-16 – 2011-04-17 (×3): 50 ug/min via INTRAVENOUS
  Filled 2011-04-15: qty 250

## 2011-04-15 MED ORDER — METOPROLOL TARTRATE 1 MG/ML IV SOLN
5.0000 mg | INTRAVENOUS | Status: DC
  Administered 2011-04-16 – 2011-04-20 (×29): 5 mg via INTRAVENOUS
  Filled 2011-04-15 (×30): qty 5

## 2011-04-15 MED ORDER — HEPARIN (PORCINE) IN NACL 100-0.45 UNIT/ML-% IJ SOLN: 1050.0000 [IU]/h | INTRAMUSCULAR | Status: DC

## 2011-04-15 MED ORDER — HYDRALAZINE HCL 20 MG/ML IJ SOLN
10.0000 mg | INTRAMUSCULAR | Status: DC | PRN
  Administered 2011-04-20: 10 mg via INTRAVENOUS
  Filled 2011-04-15: qty 1

## 2011-04-15 MED ORDER — INSULIN ASPART 100 UNIT/ML ~~LOC~~ SOLN
0.0000 [IU] | SUBCUTANEOUS | Status: DC
  Administered 2011-04-16 (×2): 1 [IU] via SUBCUTANEOUS
  Administered 2011-04-16: 2 [IU] via SUBCUTANEOUS
  Administered 2011-04-16 (×3): 1 [IU] via SUBCUTANEOUS
  Administered 2011-04-17: 2 [IU] via SUBCUTANEOUS
  Administered 2011-04-17 (×4): 1 [IU] via SUBCUTANEOUS
  Administered 2011-04-17 – 2011-04-18 (×7): 2 [IU] via SUBCUTANEOUS
  Administered 2011-04-19: 3 [IU] via SUBCUTANEOUS
  Administered 2011-04-19 – 2011-04-20 (×5): 2 [IU] via SUBCUTANEOUS
  Administered 2011-04-20 (×2): 1 [IU] via SUBCUTANEOUS
  Administered 2011-04-20: 2 [IU] via SUBCUTANEOUS
  Administered 2011-04-20: 1 [IU] via SUBCUTANEOUS
  Administered 2011-04-20: 2 [IU] via SUBCUTANEOUS
  Administered 2011-04-21: 1 [IU] via SUBCUTANEOUS
  Administered 2011-04-21: 2 [IU] via SUBCUTANEOUS
  Administered 2011-04-21: 1 [IU] via SUBCUTANEOUS
  Administered 2011-04-21: 2 [IU] via SUBCUTANEOUS
  Filled 2011-04-15 (×2): qty 3

## 2011-04-15 MED ORDER — ONDANSETRON HCL 4 MG/2ML IJ SOLN
4.0000 mg | Freq: Four times a day (QID) | INTRAMUSCULAR | Status: DC
  Administered 2011-04-16 – 2011-04-22 (×26): 4 mg via INTRAVENOUS
  Filled 2011-04-15 (×41): qty 2

## 2011-04-15 MED ORDER — ASPIRIN EC 325 MG PO TBEC
325.0000 mg | DELAYED_RELEASE_TABLET | Freq: Every day | ORAL | Status: DC
  Filled 2011-04-15 (×2): qty 1

## 2011-04-15 MED ORDER — ACETAMINOPHEN 325 MG PO TABS: 650.0000 mg | ORAL_TABLET | ORAL | Status: DC | PRN

## 2011-04-16 ENCOUNTER — Inpatient Hospital Stay (HOSPITAL_COMMUNITY): Payer: Medicare Other

## 2011-04-16 DIAGNOSIS — N390 Urinary tract infection, site not specified: Secondary | ICD-10-CM | POA: Diagnosis present

## 2011-04-16 DIAGNOSIS — K56609 Unspecified intestinal obstruction, unspecified as to partial versus complete obstruction: Secondary | ICD-10-CM | POA: Diagnosis present

## 2011-04-16 LAB — CBC
Hemoglobin: 10.8 g/dL — ABNORMAL LOW (ref 12.0–15.0)
MCH: 29.3 pg (ref 26.0–34.0)
MCHC: 32 g/dL (ref 30.0–36.0)
Platelets: 176 10*3/uL (ref 150–400)

## 2011-04-16 LAB — COMPREHENSIVE METABOLIC PANEL
ALT: 34 U/L (ref 0–35)
Calcium: 8.8 mg/dL (ref 8.4–10.5)
GFR calc Af Amer: 64 mL/min — ABNORMAL LOW (ref >90)
Glucose, Bld: 140 mg/dL — ABNORMAL HIGH (ref 70–99)
Sodium: 152 mEq/L — ABNORMAL HIGH (ref 135–145)
Total Protein: 5.5 g/dL — ABNORMAL LOW (ref 6.0–8.3)

## 2011-04-16 LAB — GLUCOSE, CAPILLARY
Glucose-Capillary: 139 mg/dL — ABNORMAL HIGH (ref 70–99)
Glucose-Capillary: 140 mg/dL — ABNORMAL HIGH (ref 70–99)
Glucose-Capillary: 137 mg/dL — ABNORMAL HIGH (ref 70–99)

## 2011-04-16 MED ORDER — SODIUM CHLORIDE 0.45 % IV SOLN
INTRAVENOUS | Status: DC
  Administered 2011-04-19: 20 mL/h via INTRAVENOUS
  Administered 2011-04-19: 13:00:00 via INTRAVENOUS
  Administered 2011-04-19 – 2011-04-21 (×2): 20 mL/h via INTRAVENOUS

## 2011-04-16 MED ORDER — POTASSIUM CHLORIDE 10 MEQ/100ML IV SOLN
10.0000 meq | INTRAVENOUS | Status: AC
  Administered 2011-04-16 (×3): 10 meq via INTRAVENOUS
  Filled 2011-04-16 (×2): qty 100

## 2011-04-16 MED ORDER — ENOXAPARIN SODIUM 30 MG/0.3ML ~~LOC~~ SOLN
30.0000 mg | SUBCUTANEOUS | Status: DC
  Administered 2011-04-16 – 2011-04-23 (×8): 30 mg via SUBCUTANEOUS
  Filled 2011-04-16 (×10): qty 0.3

## 2011-04-16 MED ORDER — POTASSIUM CL IN DEXTROSE 5% 20 MEQ/L IV SOLN
20.0000 meq | INTRAVENOUS | Status: DC
  Administered 2011-04-16: 20 meq via INTRAVENOUS
  Filled 2011-04-16 (×2): qty 1000

## 2011-04-16 MED ORDER — POTASSIUM CHLORIDE 10 MEQ/100ML IV SOLN
10.0000 meq | INTRAVENOUS | Status: DC
  Administered 2011-04-16: 10 meq via INTRAVENOUS
  Filled 2011-04-16 (×2): qty 100

## 2011-04-16 NOTE — Progress Notes (Signed)
PARENTERAL NUTRITION CONSULT NOTE - INITIAL  Pharmacy Consult for TNA Indication: SBO  Allergies  Allergen Reactions  . Promethazine Other (See Comments)    Unknown     Patient Measurements: Height: 5\' 1"  (154.9 cm) (Entered for [place]) Weight: 118 lb 13.3 oz (53.9 kg) IBW/kg (Calculated) : 47.8  Adjusted Body Weight: NA Usual Weight: 48 kg  Vital Signs: Temp: 98.8 F (37.1 C) (11/04 0800) Temp src: Oral (11/04 0800) BP: 172/53 mmHg (11/04 1600) Pulse Rate: 69  (11/04 1600) Intake/Output from previous day: 11/03 0701 - 11/04 0700 In: 246 [I.V.:196; IV Piggyback:50] Out: 850 [Urine:400; Emesis/NG output:400; Stool:50] Intake/Output from this shift: Total I/O In: 150 [I.V.:150] Out: 1125 [Urine:375; Emesis/NG output:750]  Labs:  [facility] 04/16/11 1303 04/15/11 0226 04/14/11 0917  WBC 8.6 13.1* 12.4*  HGB 10.8* 12.1 13.6  HCT 33.7* 36.3 42.1  PLT 176 233 242  APTT -- -- --  INR -- -- --     Basename 04/16/11 1303 04/15/11 0226 04/14/11 0252  NA 152* 148* 146*  K 3.0* 3.2* 3.7  CL 102 106 106  CO2 41* 30 26  GLUCOSE 140* 156* 149*  BUN 37* 35* 24*  CREATININE 0.94 1.11* 1.07  LABCREA -- -- --  CREAT24HRUR -- -- --  CALCIUM 8.8 8.5 8.6  MG -- 2.0 --  PHOS -- -- --  PROT 5.5* 5.6* --  ALBUMIN 2.7* 2.9* --  AST 105* 152* --  ALT 34 16 --  ALKPHOS 37* 36* --  BILITOT 0.3 0.2* --  BILIDIR -- -- --  IBILI -- -- --  PREALBUMIN -- -- --  CHOLHDL -- -- --  CHOL -- -- --   Estimated Creatinine Clearance: 35.4 ml/min (by C-G formula based on Cr of 0.94).    Basename 04/16/11 1223 04/16/11 0758 04/16/11 0504  GLUCAP 138* 140* 137*    Medical History: Past Medical History  Diagnosis Date  . History of colectomy   . Hypertension     Medications:  Infustions:    . sodium chloride 10 mL/hr at 04/16/11 1400  . dextrose 5 % with KCl 20 mEq / L 20 mEq (04/16/11 1600)  . nitroGLYCERIN 50 mcg/min (04/16/11 1300)  . DISCONTD: dextrose 5 % with KCl 20  mEq / L 20 mEq (04/16/11 1500)  . DISCONTD: heparin 10.5 mL/hr (04/16/11 0753)    Insulin Requirements in the past 24 hours:  Patient with hx. of diabetes.  She has had 3 units SQ insulin for CBG's.  She is just on SSI coverage only.  Current Nutrition:  NPO - normal nutritional habits prior to admit.  Assessment: Pt. With large NGT output.  Plan is to keep patient NPO for now and start TNA.  Nutritional Goals:  1500-1700 kCal,  60-75 grams of protein per day  Plan:  Will order AM labs. F/U CBG's   Chinita Greenland 04/16/2011,4:09 PM

## 2011-04-16 NOTE — Progress Notes (Signed)
Subjective:  She reports some nausea, denies any chest pain, SOB. No significant events overnight.  Objective:  Vital Signs in the last 24 hours: Temp:  [98.1 F (36.7 C)-99.4 F (37.4 C)] 98.1 F (36.7 C) (11/04 0400) Pulse Rate:  [74-90] 74  (11/04 0500) Resp:  [15-21] 15  (11/04 0500) BP: (144-173)/(43-97) 162/43 mmHg (11/04 0500) SpO2:  [97 %-99 %] 98 % (11/04 0500) Weight:  [53.9 kg (118 lb 13.3 oz)] 118 lb 13.3 oz (53.9 kg) (11/04 0500)  Intake/Output from previous day: 11/03 0701 - 11/04 0700 In: 226 [I.V.:176; IV Piggyback:50] Out: 850 [Urine:400; Emesis/NG output:400; Stool:50]  Physical Exam: Pt is alert and oriented,  HEENT: normal Neck: JVP - normal, carotids 2+= without bruits Lungs: CTA bilaterally CV: RRR without murmur or gallop Abd: soft, NT, Positive BS, no hepatomegaly, colostomy bag filled with air. Ext: no C/C/E, distal pulses intact and equal Skin: warm/dry no rash   Lab Results:  Basename 04/15/11 0226 04/14/11 0917  WBC 13.1* 12.4*  HGB 12.1 13.6  PLT 233 242    Basename 04/15/11 0226 04/14/11 0252  NA 148* 146*  K 3.2* 3.7  CL 106 106  CO2 30 26  GLUCOSE 156* 149*  BUN 35* 24*  CREATININE 1.11* 1.07    Basename 04/15/11 0225 04/14/11 1807  TROPONINI 19.32* 16.10*   Hepatic Function Panel  Basename 04/15/11 0226  PROT 5.6*  ALBUMIN 2.9*  AST 152*  ALT 16  ALKPHOS 36*  BILITOT 0.2*  BILIDIR --  IBILI --   No results found for this basename: CHOL in the last 72 hours No results found for this basename: PROTIME in the last 72 hours  Imaging: AXR Mildly prominent loops of small bowel in the left lower abdomen.  Contrast progressing into the distal transverse colon.  No evidence of free air on the lateral decubitus view.    Cardiac Studies: 2 D echo: Left ventricle: Wall thickness was increased in a pattern   of moderate LVH. Systolic function was normal. The   estimated ejection fraction was in the range of 60% to  65%. - Mitral valve: Moderate regurgitation. - Left atrium: The atrium was mildly dilated. - Right ventricle: The cavity size was moderately decreased.   Wall thickness was normal. - Pericardium, extracardiac: A trivial pericardial effusion   was identified.   Assessment/Plan:  1. NSTEMI: No new labs from today. Till yesterday, troponin were  trending up. 2D echo shows EF of 60-65% and moderate LVH. - Continue ASA, B- blocker, ACE - inhibitor for now. - Continue supportive care for now. - Will get a new EKG. - She will eventually need a cath after she recovers from her acute illness( SBO).  2. SBO: per primary team.  Being treated conservatively as she is not a surgical candidate.     SAWHNEY,MEGHA, M.D. 04/16/2011, 7:22 AM    I have seen Pt. With Dr. Dorthula Rue. Agreee with paln. With good LV she may do OK if her GI status improves. If patient will survive only with GI surgery, I would support proceeding with surgery depite very high cardiac risk if family and surgery are in full agreement with approach.

## 2011-04-16 NOTE — Progress Notes (Signed)
Subjective: Patient seen and examined ,denies any abdominal or chest pain .  Objective: Vital signs in last 24 hours: Temp:  [98.1 F (36.7 C)-99.4 F (37.4 C)] 98.8 F (37.1 C) (11/04 0800) Pulse Rate:  [72-90] 72  (11/04 1100) Resp:  [15-23] 18  (11/04 1100) BP: (144-173)/(43-97) 159/53 mmHg (11/04 1100) SpO2:  [97 %-99 %] 99 % (11/04 1100) Weight:  [53.9 kg (118 lb 13.3 oz)] 118 lb 13.3 oz (53.9 kg) (11/04 0500) Weight change:  Last BM Date: 04/09/11  Intake/Output from previous day: 11/03 0701 - 11/04 0700 In: 246 [I.V.:196; IV Piggyback:50] Out: 850 [Urine:400; Emesis/NG output:400; Stool:50] Total I/O In: 60 [I.V.:60] Out: 925 [Urine:175; Emesis/NG output:750]   Physical Exam: General: Alert, awake, in no acute distress. NGT to suction,draining dark brownish secretions. Heart: Regular rate and rhythm, without murmurs, rubs, gallops. Lungs: Clear to auscultation bilaterally. Abdomen: Soft, nontender, nondistended, positive bowel sounds.colostomy bag in place containing air  Extremities: No  edema with positive pedal pulses.     Lab Results: Results for orders placed during the hospital encounter of 04/12/11 (from the past 24 hour(s))  GLUCOSE, CAPILLARY     Status: Abnormal   Collection Time   04/15/11 12:13 PM      Component Value Range   Glucose-Capillary 149 (*) 70 - 99 (mg/dL)   Comment 1 Notify RN    GLUCOSE, CAPILLARY     Status: Abnormal   Collection Time   04/15/11  4:53 PM      Component Value Range   Glucose-Capillary 166 (*) 70 - 99 (mg/dL)   Comment 1 Notify RN    GLUCOSE, CAPILLARY     Status: Abnormal   Collection Time   04/15/11  8:21 PM      Component Value Range   Glucose-Capillary 141 (*) 70 - 99 (mg/dL)  GLUCOSE, CAPILLARY     Status: Abnormal   Collection Time   04/16/11 12:29 AM      Component Value Range   Glucose-Capillary 139 (*) 70 - 99 (mg/dL)  GLUCOSE, CAPILLARY     Status: Abnormal   Collection Time   04/16/11  5:04 AM   Component Value Range   Glucose-Capillary 137 (*) 70 - 99 (mg/dL)   Comment 1 Notify RN     Comment 2 Documented in Chart    GLUCOSE, CAPILLARY     Status: Abnormal   Collection Time   04/16/11  7:58 AM      Component Value Range   Glucose-Capillary 140 (*) 70 - 99 (mg/dL)    Recent Results (from the past 240 hour(s))  URINE CULTURE     Status: Normal   Collection Time   04/12/11  9:28 PM      Component Value Range Status Comment   Specimen Description URINE, CLEAN CATCH   Final    Special Requests NONE   Final    Setup Time [id number]   Final    Colony Count 80,000 COLONIES/ML   Final    Culture ENTEROCOCCUS SPECIES   Final    Report Status 04/15/2011 FINAL   Final    Organism ID, Bacteria ENTEROCOCCUS SPECIES   Final   MRSA PCR SCREENING     Status: Normal   Collection Time   04/14/11  9:43 AM      Component Value Range Status Comment   MRSA by PCR NEGATIVE  NEGATIVE  Final     Studies/Results: Dg Abd Portable 2v  04/15/2011  *RADIOLOGY REPORT*  Clinical  Data: Small bowel obstruction  ABDOMEN - 2 VIEW  Comparison: 04/14/2011  Findings: Mildly prominent loops of small bowel in the left lower abdomen measuring up to 4.2 cm.  Contrast progressing into the distal transverse colon.  Surgical clips in the lower abdomen/pelvis.  No evidence of free air on the lateral decubitus view.  Degenerative changes of the visualized thoracolumbar spine.  IMPRESSION: Mildly prominent loops of small bowel in the left lower abdomen. Contrast progressing into the distal transverse colon.  No evidence of free air on the lateral decubitus view.  Original Report Authenticated By: Charline Bills, M.D.    Medications:    . ampicillin (OMNIPEN) IV  1 g Intravenous Q6H  . aspirin  324 mg Oral Daily  . cloNIDine  0.1 mg Transdermal Weekly  . enalaprilat  1.25 mg Intravenous Q6H  . enoxaparin (LOVENOX) injection  30 mg Subcutaneous 1 day or 1 dose  . insulin aspart  0-9 Units Subcutaneous Q4H  .  metoprolol  5 mg Intravenous Q4H  . ondansetron (ZOFRAN) IV  4 mg Intravenous Q6H  . DISCONTD: aspirin EC  325 mg Oral Daily  . DISCONTD: metoprolol  5 mg Intravenous Q6H    acetaminophen, hydrALAZINE, morphine injection     . sodium chloride 10 mL/hr at 04/16/11 1100  . dextrose 5 % with KCl 20 mEq / L 20 mEq (04/16/11 1100)  . nitroGLYCERIN 50 mcg/min (04/16/11 1100)  . DISCONTD: heparin 10.5 mL/hr (04/16/11 0753)    Assessment/Plan:  SBO: Continue conservative management as per [facility] . NGT output still high ,noted to have some gas in colostomy bag .scheduled for repeat abdomen x ray series today NSTEMI: Continue medical management as per cardiology.will eventually need cath when sable. Enterococcus UTI: Continue Ampicillin d#2 Hypokalemia  Was repelted ,repeat labs pending. Mild hypernatremia: IVF changed to D5W , f/u repeat labs today. HTN: Uncontrolled,continue clonidine patch, IV vasotec and metoprolol prn.      LOS: 4 days   Monica Rhodes 04/16/2011, 11:35 AM

## 2011-04-16 NOTE — Progress Notes (Signed)
[facility] Progress Note    Subjective: The patient says that she has passed flatus on a number of occasions through her colostomy bag. On examination she has gas in her colostomy bag currently. Has been no screw and did recently. Her NG tube output for the last 24 hours was about 750 cc, and about 400 cc of lasting hours. So, in spite of the increased output from her stoma of gas does not appear so we're able to remove or clamp her NG tube yet.  Objective: Vital signs in last 24 hours: Temp:  [98.1 F (36.7 C)-99.4 F (37.4 C)] 98.8 F (37.1 C) (11/04 0800) Pulse Rate:  [74-90] 83  (11/04 0900) Resp:  [15-23] 23  (11/04 0900) BP: (144-173)/(43-97) 165/56 mmHg (11/04 0900) SpO2:  [97 %-99 %] 98 % (11/04 0900) Weight:  [118 lb 13.3 oz (53.9 kg)] 118 lb 13.3 oz (53.9 kg) (11/04 0500) Last BM Date: 04/09/11  Intake/Output from previous day: 11/03 0701 - 11/04 0700 In: 246 [I.V.:196; IV Piggyback:50] Out: 850 [Urine:400; Emesis/NG output:400; Stool:50] Intake/Output this shift: Total I/O In: 20 [I.V.:20] Out: 750 [Emesis/NG output:750]  General: The patient still appears to be weak and numb but she is alert and oriented. She is in no acute distress   Lungs: Her lungs are clear to auscultation  Abd: Her abdomen is flat, nontender, and has active bowel sounds with gaseous distention of her colostomy bag.  Extremities: No changes.  Neuro: Neurologically intact, oriented x3.  Lab Results:  @LABLAST2 (wbc:2,hgb:2,hct:2,plt:2) BMET  Basename 04/15/11 0226 04/14/11 0252  NA 148* 146*  K 3.2* 3.7  CL 106 106  CO2 30 26  GLUCOSE 156* 149*  BUN 35* 24*  CREATININE 1.11* 1.07  CALCIUM 8.5 8.6   PT/INR No results found for this basename: LABPROT:2,INR:2 in the last 72 hours ABG No results found for this basename: PHART:2,PCO2:2,PO2:2,HCO3:2 in the last 72 hours  Studies/Results: Dg Chest Portable 1 View  04/14/2011  *RADIOLOGY REPORT*  Clinical Data: Shortness of breath.   Hypoxia.  Myocardial infarction.  PORTABLE CHEST - 1 VIEW  Comparison: 12/20/2005  Findings: Considerable bilateral interstitial accentuation is present in both lungs, favoring acute pulmonary edema over atypical pneumonia.  Mild cardiomegaly is present.  Nasogastric tube extends into the stomach.  Thoracic spondylosis noted.  IMPRESSION:  1.  Diffuse prominent interstitial opacity in the lungs favoring interstitial edema over atypical pneumonia. 2.  Mild cardiomegaly.  Original Report Authenticated By: Dellia Cloud, M.D.   Dg Abd Portable 2v  04/15/2011  *RADIOLOGY REPORT*  Clinical Data: Small bowel obstruction  ABDOMEN - 2 VIEW  Comparison: 04/14/2011  Findings: Mildly prominent loops of small bowel in the left lower abdomen measuring up to 4.2 cm.  Contrast progressing into the distal transverse colon.  Surgical clips in the lower abdomen/pelvis.  No evidence of free air on the lateral decubitus view.  Degenerative changes of the visualized thoracolumbar spine.  IMPRESSION: Mildly prominent loops of small bowel in the left lower abdomen. Contrast progressing into the distal transverse colon.  No evidence of free air on the lateral decubitus view.  Original Report Authenticated By: Charline Bills, M.D.    Anti-infectives: Anti-infectives    None      Assessment/Plan: s/p  The patient has not had any abdominal films for several days and therefore will order some today. A three-way abdominal series would be appropriate.  Based on her clinical evaluation she appears to be improving from metabolic structures down point there is currently  no reason to operate on her urgently.  However, I do not feel as though it is appropriate this time to use a clamp to remove her NG tube. The output is still too high and I feels the patient would be at risk for vomiting and possible aspiration.  LOS: 4 days   Marta Lamas. Gae Bon, MD, FACS [phone] [facility] 04/16/2011

## 2011-04-17 ENCOUNTER — Other Ambulatory Visit (HOSPITAL_COMMUNITY): Payer: Medicare Other

## 2011-04-17 ENCOUNTER — Inpatient Hospital Stay (HOSPITAL_COMMUNITY): Payer: Medicare Other

## 2011-04-17 LAB — COMPREHENSIVE METABOLIC PANEL
ALT: 49 U/L — ABNORMAL HIGH (ref 0–35)
AST: 121 U/L — ABNORMAL HIGH (ref 0–37)
Albumin: 2.7 g/dL — ABNORMAL LOW (ref 3.5–5.2)
Alkaline Phosphatase: 40 U/L (ref 39–117)
GFR calc Af Amer: 63 mL/min — ABNORMAL LOW (ref >90)
Glucose, Bld: 154 mg/dL — ABNORMAL HIGH (ref 70–99)
Potassium: 3.3 mEq/L — ABNORMAL LOW (ref 3.5–5.1)
Sodium: 149 mEq/L — ABNORMAL HIGH (ref 135–145)
Total Protein: 5.6 g/dL — ABNORMAL LOW (ref 6.0–8.3)

## 2011-04-17 LAB — DIFFERENTIAL
Eosinophils Absolute: 0 10*3/uL (ref 0.0–0.7)
Eosinophils Relative: 0 % (ref 0–5)
Lymphs Abs: 1 10*3/uL (ref 0.7–4.0)
Monocytes Relative: 12 % (ref 3–12)

## 2011-04-17 LAB — GLUCOSE, CAPILLARY
Glucose-Capillary: 150 mg/dL — ABNORMAL HIGH (ref 70–99)
Glucose-Capillary: 137 mg/dL — ABNORMAL HIGH (ref 70–99)

## 2011-04-17 LAB — TRIGLYCERIDES: Triglycerides: 216 mg/dL — ABNORMAL HIGH (ref <150)

## 2011-04-17 LAB — CBC
HCT: 33.4 % — ABNORMAL LOW (ref 36.0–46.0)
Hemoglobin: 10.3 g/dL — ABNORMAL LOW (ref 12.0–15.0)
MCH: 28.9 pg (ref 26.0–34.0)
MCV: 93.6 fL (ref 78.0–100.0)
RBC: 3.57 MIL/uL — ABNORMAL LOW (ref 3.87–5.11)

## 2011-04-17 MED ORDER — ENALAPRILAT 1.25 MG/ML IV SOLN
2.5000 mg | Freq: Four times a day (QID) | INTRAVENOUS | Status: DC
  Administered 2011-04-17 – 2011-04-20 (×14): 2.5 mg via INTRAVENOUS
  Filled 2011-04-17 (×17): qty 2

## 2011-04-17 MED ORDER — TRACE MINERALS CR-CU-MN-SE-ZN 10-1000-500-60 MCG/ML IV SOLN
INTRAVENOUS | Status: AC
  Administered 2011-04-17: 18:00:00 via INTRAVENOUS
  Filled 2011-04-17: qty 1000

## 2011-04-17 MED ORDER — POTASSIUM CHLORIDE 10 MEQ/50ML IV SOLN
INTRAVENOUS | Status: AC
  Administered 2011-04-17: 10 meq via INTRAVENOUS
  Filled 2011-04-17: qty 50

## 2011-04-17 MED ORDER — FAT EMULSION 20 % IV EMUL
250.0000 mL | INTRAVENOUS | Status: AC
  Administered 2011-04-17 – 2011-04-18 (×2): 250 mL via INTRAVENOUS
  Filled 2011-04-17: qty 250

## 2011-04-17 MED ORDER — POTASSIUM CHLORIDE 10 MEQ/50ML IV SOLN
INTRAVENOUS | Status: AC
  Filled 2011-04-17: qty 50

## 2011-04-17 MED ORDER — POTASSIUM CHLORIDE 10 MEQ/50ML IV SOLN
10.0000 meq | INTRAVENOUS | Status: AC
  Administered 2011-04-17 (×4): 10 meq via INTRAVENOUS

## 2011-04-17 MED ORDER — MAGNESIUM HYDROXIDE 400 MG/5ML PO SUSP
473.0000 mL | Freq: Once | ORAL | Status: AC
  Administered 2011-04-17: 473 mL via RECTAL
  Filled 2011-04-17: qty 118.25

## 2011-04-17 MED ORDER — SODIUM CHLORIDE 0.9 % IJ SOLN
10.0000 mL | INTRAMUSCULAR | Status: DC | PRN
  Administered 2011-04-19 – 2011-04-27 (×13): 10 mL

## 2011-04-17 MED ORDER — PANTOPRAZOLE SODIUM 40 MG IV SOLR
40.0000 mg | INTRAVENOUS | Status: DC
  Administered 2011-04-17 – 2011-04-20 (×4): 40 mg via INTRAVENOUS
  Filled 2011-04-17 (×4): qty 40

## 2011-04-17 MED ORDER — SODIUM CHLORIDE 0.9 % IJ SOLN
10.0000 mL | Freq: Two times a day (BID) | INTRAMUSCULAR | Status: DC
  Administered 2011-04-17 – 2011-04-25 (×9): 10 mL

## 2011-04-17 MED ORDER — POTASSIUM CHLORIDE 2 MEQ/ML IV SOLN
INTRAVENOUS | Status: DC
  Administered 2011-04-17 – 2011-04-20 (×2): via INTRAVENOUS
  Filled 2011-04-17 (×3): qty 1000

## 2011-04-17 MED ORDER — POTASSIUM CHLORIDE 10 MEQ/100ML IV SOLN: 10.0000 meq | INTRAVENOUS | Status: DC

## 2011-04-17 NOTE — Progress Notes (Signed)
[facility] - PGY II RESIDENT NOTE   Subjective:    No acute issues overnight. Pt denies chest pain, SOB, palpitations, nausea, vomiting, abdominal pain. Continues to have significant output from NG.    Objective:  Vital Signs:   BP 151/63  Pulse 67  Temp(Src) 98.2 F (36.8 C) (Oral)  Resp 20  Ht 5\' 1"  (1.549 m)  Wt 117 lb 15.1 oz (53.5 kg)  BMI 22.29 kg/m2  SpO2 99%   Intake/Output:  11/04 0701 - 11/05 0700 In: 1015 [I.V.:1015] Out: 2375 [Urine:825; Emesis/NG output:1550]   Physical Exam: General: Vital signs reviewed and noted. Well-developed, well-nourished, in no acute distress; alert, appropriate and cooperative throughout examination.  Lungs:  Normal respiratory effort. Clear to auscultation BL without crackles or wheezes.  Heart: RRR. S1 and S2 normal without gallop, murmur, or rubs.  Abdomen:  BS hypoactive. Soft, Nondistended, non-tender.  No output from LLQ ostomy appreciated.   Extremities: No pretibial edema.     Labs: Basic Metabolic Panel:  Lab 04/16/11 9562 04/15/11 0226 04/14/11 0252  NA 152* 148* 146*  K 3.0* 3.2* --  CL 102 106 106  CO2 41* 30 26  GLUCOSE 140* 156* 149*  BUN 37* 35* 24*  CREATININE 0.94 1.11* 1.07  CALCIUM 8.8 8.5 8.6  MG -- 2.0 --  PHOS -- -- --    Liver Function Tests: Lab 04/16/11 1303 04/15/11 0226  AST 105* 152*  ALT 34 16  ALKPHOS 37* 36*  BILITOT 0.3 0.2*  PROT 5.5* 5.6*  ALBUMIN 2.7* 2.9*    CBC: Lab 04/17/11 0538 04/16/11 1303 04/15/11 0226  WBC 7.3 8.6 13.1*  NEUTROABS 5.5 -- --  HGB 10.3* 10.8* 12.1  HCT 33.4* 33.7* 36.3  MCV 93.6 91.3 88.5  PLT 167 176 233    Cardiac Enzymes: Lab 04/15/11 0225 04/14/11 1807 04/14/11 0917 04/14/11 0252 04/13/11 2055  CKTOTAL 964* 1209* 1087* 595* 115  CKMB 71.2* 129.1* 135.8* 85.2* 5.8*  CKMBINDEX -- -- -- -- --  TROPONINI 19.32* 16.10* 12.48* 5.78* <0.30    CBG:  Lab 04/16/11 2355 04/16/11 1952 04/16/11 1548 04/16/11 1223 04/16/11 0758  GLUCAP 123*  158* 134* 138* 140*    Imaging: No new images available.  Medications: Infusions: . sodium chloride 10 mL/hr at 04/16/11 1400  . dextrose 5 % with kcl    . nitroGLYCERIN 50 mcg/min (04/17/11 0500)    Scheduled Medications: . ampicillin (OMNIPEN) IV  1 g Intravenous Q6H  . aspirin  324 mg Oral Daily  . cloNIDine  0.1 mg Transdermal Weekly  . Enalapril   1.25 mg Intravenous Q6H  . enoxaparin (LOVENOX) injection  30 mg Subcutaneous 1 day or 1 dose  . insulin aspart  0-9 Units Subcutaneous Q4H  . metoprolol  5 mg Intravenous Q4H  . ondansetron (ZOFRAN) IV  4 mg Intravenous Q6H  . potassium chloride  10 mEq Intravenous Q2H    PRN Medications: acetaminophen, hydrALAZINE, morphine injection    Assessment/ Plan: Pt is a 77 y.o. yo female who was admitted on 04/12/2011 with SBO, who was later found to have NSTEMI based on diffuse ST depressions and up-trending troponins. At this time, interventions at this time will be focused on conservative medical management of her SBO and NSTEMI with plans for medical management until medically stable for consideration of cath.    NSTEMI:  2D echo shows EF of 60-65% and moderate LVH. Heparin drip has been discontinued yesterday. NTG gtt continues at this time. If  medically stabilizes in regards to acute illness, we anticipate catheterization for evaluation for high grade lesion. However, if not able to resolve/ improve acute medical illness (SBO/UTI) with current conservative management, and surgical intervention is required. We are inclined to provide surgical clearance from a cardiac standpoint with the understanding that the patient will be high-risk.   Continue ASA, B- blocker, ACE - inhibitor for now.  Consider escalation of  BB versus ACE-I to provide better blood pressure control, with preference of escalating ACE-I given that patient's heart rates remain in low 60s. .  Continue supportive care for now.   She will eventually need a cath  after she recovers from her acute illness (SBO).    SBO: Still with high NG output, therefore, NG tube not yet clamped.  Per primary team and surgery consult.   Being treated conservatively as she is not a surgical candidate.  To start TPN likely today.   UTI - ESBL E.coli - per primary team and infectious diseases service   Hypokalemia - Likely due to NPO status and continued NG output. Would like to keep potassium levels closer to 4. Expect stabilization as TPN initiated.  Replete per primary team.   This case and plan of care was discussed and reviewed with Dr. Willa Rough.   See also the independent note that I wrote.   KALIA-REYNOLDS,M SHELLY 04/17/2011, 6:03 AM

## 2011-04-17 NOTE — Progress Notes (Signed)
PARENTERAL NUTRITION CONSULT NOTE - FOLLOW UP  Pharmacy Consult for TPN   Indication: SBO with high NG output   Allergies  Allergen Reactions  . Promethazine Other (See Comments)    Unknown     Patient Measurements: Height: 5\' 1"  (154.9 cm) (Entered for Cutover) Weight: 117 lb 15.1 oz (53.5 kg) (53.5) IBW/kg (Calculated) : 47.8  Adjusted Body Weight:  Usual Weight: 53.5   Vital Signs: Temp: 97.8 F (36.6 C) (11/05 0400) Temp src: Oral (11/05 0400) BP: 166/47 mmHg (11/05 0900) Pulse Rate: 70  (11/05 0900) Intake/Output from previous day: 11/04 0701 - 11/05 0700 In: 1515 [I.V.:1515] Out: 3575 [Urine:825; Emesis/NG output:2750] Intake/Output from this shift: Total I/O In: 60 [I.V.:60] Out: -   Labs:  Basename 04/17/11 0538 04/16/11 1303 04/15/11 0226  WBC 7.3 8.6 13.1*  HGB 10.3* 10.8* 12.1  HCT 33.4* 33.7* 36.3  PLT 167 176 233  APTT -- -- --  INR -- -- --     Basename 04/17/11 0538 04/16/11 1303 04/15/11 0226  NA 149* 152* 148*  K 3.3* 3.0* 3.2*  CL 100 102 106  CO2 42* 41* 30  GLUCOSE 154* 140* 156*  BUN 34* 37* 35*  CREATININE 0.95 0.94 1.11*  LABCREA -- -- --  CREAT24HRUR -- -- --  CALCIUM 9.0 8.8 8.5  MG 2.1 -- 2.0  PHOS 2.9 -- --  PROT 5.6* 5.5* 5.6*  ALBUMIN 2.7* 2.7* 2.9*  AST 121* 105* 152*  ALT 49* 34 16  ALKPHOS 40 37* 36*  BILITOT 0.4 0.3 0.2*  BILIDIR -- -- --  IBILI -- -- --  PREALBUMIN -- -- --  CHOLHDL -- -- --  CHOL 184 -- --   Estimated Creatinine Clearance: 35 ml/min (by C-G formula based on Cr of 0.95).    Basename 04/17/11 0807 04/16/11 2355 04/16/11 1952  GLUCAP 150* 123* 158*    Medications:  Scheduled:    . ampicillin (OMNIPEN) IV  1 g Intravenous Q6H  . aspirin  324 mg Oral Daily  . cloNIDine  0.1 mg Transdermal Weekly  . enalaprilat  2.5 mg Intravenous Q6H  . enoxaparin (LOVENOX) injection  30 mg Subcutaneous 1 day or 1 dose  . insulin aspart  0-9 Units Subcutaneous Q4H  . metoprolol  5 mg Intravenous Q4H    . ondansetron (ZOFRAN) IV  4 mg Intravenous Q6H  . potassium chloride  10 mEq Intravenous Q2H  . potassium chloride  10 mEq Intravenous Q1 Hr x 4  . DISCONTD: aspirin EC  325 mg Oral Daily  . DISCONTD: enalaprilat  1.25 mg Intravenous Q6H  . DISCONTD: potassium chloride  10 mEq Intravenous Q1 Hr x 4    Insulin Requirements in the past 24 hours:   cbgs 134, 158, 123, 150  Current Nutrition: NPO    Assessment: 77 yo WF w/ hx colectomy, s/p MI with SBO with high NG output to get PICC line and start TPN for nutrition support   Nutritional Goals:  60 ml/hr will provide 1702  kCal, 72 grams of protein per day  Plan: Start at 42 ml/hr tpn with standard electrolyes and fat at 10 ml/hr, to get 4 runs k per MD, continue current SSI  Charnette Younkin T 04/17/2011,9:34 AM

## 2011-04-17 NOTE — Progress Notes (Signed)
INITIAL ADULT NUTRITION ASSESSMENT Date: 04/17/2011   Time: 10:37 AM Reason for Assessment: New TPN  ASSESSMENT: Female 77 y.o.  Dx: SBO (small bowel obstruction)  Hx:  Past Medical History  Diagnosis Date  . History of colectomy   . Hypertension   Diabetes, COPD, Hyperlipidemia, Colon cancer, partial colectomy, bowel obstruction, colostomy   Related Meds:     . ampicillin (OMNIPEN) IV  1 g Intravenous Q6H  . aspirin  324 mg Oral Daily  . cloNIDine  0.1 mg Transdermal Weekly  . enalaprilat  2.5 mg Intravenous Q6H  . enoxaparin (LOVENOX) injection  30 mg Subcutaneous 1 day or 1 dose  . insulin aspart  0-9 Units Subcutaneous Q4H  . metoprolol  5 mg Intravenous Q4H  . ondansetron (ZOFRAN) IV  4 mg Intravenous Q6H  . pantoprazole (PROTONIX) IV  40 mg Intravenous Q24H  . potassium chloride  10 mEq Intravenous Q2H  . potassium chloride  10 mEq Intravenous Q1 Hr x 4  . sodium chloride  10 mL Intracatheter Q12H  . sorbitol, milk of mag, mineral oil, glycerin (SMOG) enema  473 mL Rectal Once  . DISCONTD: aspirin EC  325 mg Oral Daily  . DISCONTD: enalaprilat  1.25 mg Intravenous Q6H  . DISCONTD: potassium chloride  10 mEq Intravenous Q1 Hr x 4  . DISCONTD: potassium chloride  10 mEq Intravenous Q1 Hr x 4    Ht: 5\' 1"  (154.9 cm) (Entered for Cutover)  Wt: 117 lb 15.1 oz (53.5 kg) (53.5)  Ideal Wt: 47.8 kg  % Ideal Wt: 112%  Usual Wt: 125 # % Usual Wt: 94%  Body mass index is 22.29 kg/(m^2).  Food/Nutrition Related Hx: Patient followed a diabetic diet PTA with good intake per her report.  Labs: Sodium 149 mEq/L H     Potassium 3.3 mEq/L L     Chloride 100 mEq/L      CO2 42 mEq/L HH     Glucose, Bld 154 mg/dL H     BUN 34 mg/dL H     Creatinine, Ser 4.09 mg/dL      Calcium 9.0 mg/dL      Total Protein 5.6 g/dL L     Albumin 2.7 g/dL L     AST 811 U/L H     ALT 49 U/L H     Alkaline Phosphatase 40 U/L      Total Bilirubin 0.4 mg/dL      GFR calc non Af Amer 55 mL/min L      GFR calc Af Amer 63 mL/min L     I/O last 3 completed shifts: In: 1761 [I.V.:1711; IV Piggyback:50] Out: 4425 [Urine:1225; Emesis/NG output:3150; Stool:50] Total I/O In: 60 [I.V.:60] Out: -      Diet Order: NPO  TPN:  Clinimix E 5/15 at 42 ml/h with 20% intralipids at 10 ml/h to provide 1248 ml, 1196 kcals and 50 grams protein daily.   IVF:    sodium chloride Last Rate: 10 mL/hr at 04/16/11 1400  dextrose 5 % with kcl   TPN (CLINIMIX) +/- additives   And   fat emulsion   nitroGLYCERIN Last Rate: 50 mcg/min (04/17/11 0800)  DISCONTD: dextrose 5 % with KCl 20 mEq / L Last Rate: 20 mEq (04/16/11 1500)  DISCONTD: dextrose 5 % with KCl 20 mEq / L Last Rate: 20 mEq (04/16/11 1600)    Estimated Nutritional Needs:   Kcal: 1200-1400 Protein: 65-75 grams Fluid: 1.2 - 1.4 Liters  Patient with  severe PCM in the context of acute illness, given recent 6% weight loss and intake less than 50% of estimated energy requirements for at least 5 days.  NUTRITION DIAGNOSIS: -Inadequate oral intake (NI-2.1).  Status: Ongoing  RELATED TO: altered GI function   AS EVIDENCE BY: NPO status  MONITORING/EVALUATION(Goals): Goal:  Meet 90 - 100% of estimated nutrition needs.  Monitor adequacy of TPN and diet advancement.  EDUCATION NEEDS: -No education needs identified at this time  INTERVENTION: Follow TPN with Pharmacy and assist with transition to oral diet when patient is able.  Dietitian [phone]  DOCUMENTATION CODES Per approved criteria  -Severe malnutrition in the context of acute illness or injury    Monica Rhodes 04/17/2011, 10:37 AM

## 2011-04-17 NOTE — Progress Notes (Signed)
Subjective: Patient seen and examined ,more interactive today ,looking better .She denies any chest pain or abdominal pain ,she has mild cough productive of whitish sputum ,no fever or chills noted.  Objective: Vital signs in last 24 hours: Temp:  [97.8 F (36.6 C)-98.2 F (36.8 C)] 97.8 F (36.6 C) (11/05 0400) Pulse Rate:  [60-83] 63  (11/05 0700) Resp:  [15-23] 16  (11/05 0700) BP: (151-183)/(50-65) 167/65 mmHg (11/05 0700) SpO2:  [98 %-100 %] 99 % (11/05 0700) Weight:  [53.5 kg (117 lb 15.1 oz)] 117 lb 15.1 oz (53.5 kg) (11/05 0500) Weight change: -0.4 kg (-14.1 oz) Last BM Date: 04/09/11  Intake/Output from previous day: 11/04 0701 - 11/05 0700 In: 1515 [I.V.:1515] Out: 3575 [Urine:825; Emesis/NG output:2750]     Physical Exam: General: Alert, awake, in no acute distress. Heart: Regular rate and rhythm, without murmurs, rubs, gallops. Lungs: Clear to auscultation bilaterally. Abdomen: Soft, nontender, nondistended, positive bowel sounds.colostomy in place  Extremities: No clubbing cyanosis or edema with positive pedal pulses.     Lab Results: Results for orders placed during the hospital encounter of 04/12/11 (from the past 24 hour(s))  GLUCOSE, CAPILLARY     Status: Abnormal   Collection Time   04/16/11 12:23 PM      Component Value Range   Glucose-Capillary 138 (*) 70 - 99 (mg/dL)  CBC     Status: Abnormal   Collection Time   04/16/11  1:03 PM      Component Value Range   WBC 8.6  4.0 - 10.5 (K/uL)   RBC 3.69 (*) 3.87 - 5.11 (MIL/uL)   Hemoglobin 10.8 (*) 12.0 - 15.0 (g/dL)   HCT 09.8 (*) 11.9 - 46.0 (%)   MCV 91.3  78.0 - 100.0 (fL)   MCH 29.3  26.0 - 34.0 (pg)   MCHC 32.0  30.0 - 36.0 (g/dL)   RDW 14.7  82.9 - 56.2 (%)   Platelets 176  150 - 400 (K/uL)  COMPREHENSIVE METABOLIC PANEL     Status: Abnormal   Collection Time   04/16/11  1:03 PM      Component Value Range   Sodium 152 (*) 135 - 145 (mEq/L)   Potassium 3.0 (*) 3.5 - 5.1 (mEq/L)   Chloride  102  96 - 112 (mEq/L)   CO2 41 (*) 19 - 32 (mEq/L)   Glucose, Bld 140 (*) 70 - 99 (mg/dL)   BUN 37 (*) 6 - 23 (mg/dL)   Creatinine, Ser 1.30  0.50 - 1.10 (mg/dL)   Calcium 8.8  8.4 - 86.5 (mg/dL)   Total Protein 5.5 (*) 6.0 - 8.3 (g/dL)   Albumin 2.7 (*) 3.5 - 5.2 (g/dL)   AST 784 (*) 0 - 37 (U/L)   ALT 34  0 - 35 (U/L)   Alkaline Phosphatase 37 (*) 39 - 117 (U/L)   Total Bilirubin 0.3  0.3 - 1.2 (mg/dL)   GFR calc non Af Amer 55 (*) >90 (mL/min)   GFR calc Af Amer 64 (*) >90 (mL/min)  GLUCOSE, CAPILLARY     Status: Abnormal   Collection Time   04/16/11  3:48 PM      Component Value Range   Glucose-Capillary 134 (*) 70 - 99 (mg/dL)  GLUCOSE, CAPILLARY     Status: Abnormal   Collection Time   04/16/11  7:52 PM      Component Value Range   Glucose-Capillary 158 (*) 70 - 99 (mg/dL)  GLUCOSE, CAPILLARY     Status: Abnormal  Collection Time   04/16/11 11:55 PM      Component Value Range   Glucose-Capillary 123 (*) 70 - 99 (mg/dL)  COMPREHENSIVE METABOLIC PANEL     Status: Abnormal   Collection Time   04/17/11  5:38 AM      Component Value Range   Sodium 149 (*) 135 - 145 (mEq/L)   Potassium 3.3 (*) 3.5 - 5.1 (mEq/L)   Chloride 100  96 - 112 (mEq/L)   CO2 42 (*) 19 - 32 (mEq/L)   Glucose, Bld 154 (*) 70 - 99 (mg/dL)   BUN 34 (*) 6 - 23 (mg/dL)   Creatinine, Ser 1.61  0.50 - 1.10 (mg/dL)   Calcium 9.0  8.4 - 09.6 (mg/dL)   Total Protein 5.6 (*) 6.0 - 8.3 (g/dL)   Albumin 2.7 (*) 3.5 - 5.2 (g/dL)   AST 045 (*) 0 - 37 (U/L)   ALT 49 (*) 0 - 35 (U/L)   Alkaline Phosphatase 40  39 - 117 (U/L)   Total Bilirubin 0.4  0.3 - 1.2 (mg/dL)   GFR calc non Af Amer 55 (*) >90 (mL/min)   GFR calc Af Amer 63 (*) >90 (mL/min)  MAGNESIUM     Status: Normal   Collection Time   04/17/11  5:38 AM      Component Value Range   Magnesium 2.1  1.5 - 2.5 (mg/dL)  PHOSPHORUS     Status: Normal   Collection Time   04/17/11  5:38 AM      Component Value Range   Phosphorus 2.9  2.3 - 4.6 (mg/dL)    CBC     Status: Abnormal   Collection Time   04/17/11  5:38 AM      Component Value Range   WBC 7.3  4.0 - 10.5 (K/uL)   RBC 3.57 (*) 3.87 - 5.11 (MIL/uL)   Hemoglobin 10.3 (*) 12.0 - 15.0 (g/dL)   HCT 40.9 (*) 81.1 - 46.0 (%)   MCV 93.6  78.0 - 100.0 (fL)   MCH 28.9  26.0 - 34.0 (pg)   MCHC 30.8  30.0 - 36.0 (g/dL)   RDW 91.4  78.2 - 95.6 (%)   Platelets 167  150 - 400 (K/uL)  DIFFERENTIAL     Status: Normal   Collection Time   04/17/11  5:38 AM      Component Value Range   Neutrophils Relative 75  43 - 77 (%)   Neutro Abs 5.5  1.7 - 7.7 (K/uL)   Lymphocytes Relative 13  12 - 46 (%)   Lymphs Abs 1.0  0.7 - 4.0 (K/uL)   Monocytes Relative 12  3 - 12 (%)   Monocytes Absolute 0.9  0.1 - 1.0 (K/uL)   Eosinophils Relative 0  0 - 5 (%)   Eosinophils Absolute 0.0  0.0 - 0.7 (K/uL)   Basophils Relative 0  0 - 1 (%)   Basophils Absolute 0.0  0.0 - 0.1 (K/uL)  CHOLESTEROL, TOTAL     Status: Normal   Collection Time   04/17/11  5:38 AM      Component Value Range   Cholesterol 184  0 - 200 (mg/dL)  TRIGLYCERIDES     Status: Abnormal   Collection Time   04/17/11  5:38 AM      Component Value Range   Triglycerides 216 (*) <150 (mg/dL)    Recent Results (from the past 240 hour(s))  URINE CULTURE     Status: Normal   Collection  Time   04/12/11  9:28 PM      Component Value Range Status Comment   Specimen Description URINE, CLEAN CATCH   Final    Special Requests NONE   Final    Setup Time [id number]   Final    Colony Count 80,000 COLONIES/ML   Final    Culture ENTEROCOCCUS SPECIES   Final    Report Status 04/15/2011 FINAL   Final    Organism ID, Bacteria ENTEROCOCCUS SPECIES   Final   MRSA PCR SCREENING     Status: Normal   Collection Time   04/14/11  9:43 AM      Component Value Range Status Comment   MRSA by PCR NEGATIVE  NEGATIVE  Final     Studies/Results: Dg Abd Acute W/chest  04/16/2011  *RADIOLOGY REPORT*  Clinical Data: Abdominal pain with nausea and vomiting.   Small bowel obstruction.  ACUTE ABDOMEN SERIES (ABDOMEN 2 VIEW & CHEST 1 VIEW)  Comparison: Radiographs 04/15/2011 and 04/14/2011.  CT 04/12/2011.  Findings: Nasogastric tube projects into the mid stomach.  There are persistent bilateral air space opacities with worsening air space disease and volume loss in the left lower lobe.  There are probable enlarging left greater than right pleural effusions.  The heart size is stable.  There is enteric contrast material within the colon. Small bowel distension in the pelvis has improved.  There is no evidence of free intraperitoneal air.  There are multiple surgical clips in the lower abdomen and pelvis.  IMPRESSION:  1.  Improving small bowel distension in the pelvis.  Enteric contrast has passed into the colon. 2.  Worsening pulmonary edema, left lower lobe air space disease and bilateral pleural effusions.  Consider aspiration pneumonia.  Original Report Authenticated By: Gerrianne Scale, M.D.   Dg Abd Portable 2v  04/15/2011  *RADIOLOGY REPORT*  Clinical Data: Small bowel obstruction  ABDOMEN - 2 VIEW  Comparison: 04/14/2011  Findings: Mildly prominent loops of small bowel in the left lower abdomen measuring up to 4.2 cm.  Contrast progressing into the distal transverse colon.  Surgical clips in the lower abdomen/pelvis.  No evidence of free air on the lateral decubitus view.  Degenerative changes of the visualized thoracolumbar spine.  IMPRESSION: Mildly prominent loops of small bowel in the left lower abdomen. Contrast progressing into the distal transverse colon.  No evidence of free air on the lateral decubitus view.  Original Report Authenticated By: Charline Bills, M.D.    Medications:    . ampicillin (OMNIPEN) IV  1 g Intravenous Q6H  . aspirin  324 mg Oral Daily  . cloNIDine  0.1 mg Transdermal Weekly  . enalaprilat  1.25 mg Intravenous Q6H  . enoxaparin (LOVENOX) injection  30 mg Subcutaneous 1 day or 1 dose  . insulin aspart  0-9 Units  Subcutaneous Q4H  . metoprolol  5 mg Intravenous Q4H  . ondansetron (ZOFRAN) IV  4 mg Intravenous Q6H  . potassium chloride  10 mEq Intravenous Q2H  . DISCONTD: aspirin EC  325 mg Oral Daily  . DISCONTD: potassium chloride  10 mEq Intravenous Q1 Hr x 4    acetaminophen, hydrALAZINE, morphine injection     . sodium chloride 10 mL/hr at 04/16/11 1400  . dextrose 5 % with kcl    . nitroGLYCERIN 50 mcg/min (04/17/11 0600)  . DISCONTD: dextrose 5 % with KCl 20 mEq / L 20 mEq (04/16/11 1500)  . DISCONTD: dextrose 5 % with KCl 20 mEq / L  20 mEq (04/16/11 1600)  . DISCONTD: heparin 10.5 mL/hr (04/16/11 0753)    Assessment/Plan:  SBO:  Abdomen x ray showed improvement  NGT output still high ,continue conservative management as per CCS . Will start TNA today. NSTEMI:  Continue medical management as per cardiology.will eventually need cath when sable.  Enterococcus UTI:  Continue Ampicillin d#3 Hypokalemia  Continue to replete ,chek magnesium level. Mild hypernatremia:  Improving with  D5W, given x ray findings of developing pulmonary edema will decrease rate to KVO . HTN:  Uncontrolled,continue clonidine patch, IV vasotec,hydralazine  and metoprolol prn. Will increase next clonidine patch dose to 0.2 mg. Suspected  Aspiration pneumonia on x ray Patient is NPO and with NGT ,no history of vomiting prior to admission ,aspiration is unlikely. No fever or leukocytosis ,will hold off antibiotics and request CXR .  *Family updated at bed side.    LOS: 5 days   Monica Rhodes 04/17/2011, 8:07 AM

## 2011-04-17 NOTE — Progress Notes (Signed)
SUBJECTIVE:     Patient is stable today.  Her NG tube of course continues to be uncomfortable but she does understand why she needs it.  I am seeing the patient today along with the internal medicine resident.  The resident also has another note that I have reviewed completely.  I am in agreement with the plan in that note also.  Filed Vitals:   04/17/11 0400 04/17/11 0500 04/17/11 0600 04/17/11 0700  BP: 164/52 171/51 179/59 167/65  Pulse: 79 70 60 63  Temp: 97.8 F (36.6 C)     TempSrc: Oral     Resp: 21 16 15 16   Height:      Weight:  117 lb 15.1 oz (53.5 kg)    SpO2: 99% 100% 100% 99%    Intake/Output Summary (Last 24 hours) at 04/17/11 0739 Last data filed at 04/17/11 0600  Gross per 24 hour  Intake   1515 ml  Output   3575 ml  Net  -2060 ml    LABS: Basic Metabolic Panel:  Basename 04/17/11 0538 04/16/11 1303 04/15/11 0226  NA 149* 152* --  K 3.3* 3.0* --  CL 100 102 --  CO2 42* 41* --  GLUCOSE 154* 140* --  BUN 34* 37* --  CREATININE 0.95 0.94 --  CALCIUM 9.0 8.8 --  MG 2.1 -- 2.0  PHOS 2.9 -- --   Liver Function Tests:  [facility] 04/17/11 0538 04/16/11 1303  AST 121* 105*  ALT 49* 34  ALKPHOS 40 37*  BILITOT 0.4 0.3  PROT 5.6* 5.5*  ALBUMIN 2.7* 2.7*   No results found for this basename: LIPASE:2,AMYLASE:2 in the last 72 hours CBC:  Basename 04/17/11 0538 04/16/11 1303  WBC 7.3 8.6  NEUTROABS 5.5 --  HGB 10.3* 10.8*  HCT 33.4* 33.7*  MCV 93.6 91.3  PLT 167 176   Cardiac Enzymes:  Basename 04/15/11 0225 04/14/11 1807 04/14/11 0917  CKTOTAL 964* 1209* 1087*  CKMB 71.2* 129.1* 135.8*  CKMBINDEX -- -- --  TROPONINI 19.32* 16.10* 12.48*   BNP: No results found for this basename: POCBNP:3 in the last 72 hours D-Dimer: No results found for this basename: DDIMER:2 in the last 72 hours Hemoglobin A1C: No results found for this basename: HGBA1C in the last 72 hours Fasting Lipid Panel:  Basename 04/17/11 0538  CHOL 184  HDL --  LDLCALC --    TRIG 216*  CHOLHDL --  LDLDIRECT --   Thyroid Function Tests: No results found for this basename: TSH,T4TOTAL,FREET3,T3FREE,THYROIDAB in the last 72 hours  RADIOLOGY:    PHYSICAL EXAM Patient is frail but stable today.  She was coughing slightly as I saw her and we sat her up and cleared her upper airway .  She is stable.  She has scattered rhonchi.  Cardiac exam reveals an S1-S2.  No clicks or significant murmurs.   TELEMETRY: Patient has normal sinus rhythm.  ASSESSMENT AND PLAN:  Principal Problem:  *SBO (small bowel obstruction) Active Problems:  Acute myocardial infarction, subendocardial infarction, subsequent episode of care       The patient continues to be stable after her non-STEMI.  We know that she has good left ventricular function by echo this admission.  I am hopeful that her small bowel obstruction will resolve.  If we get to the place where she can not get better without surgery I am in agreement with proceeding with surgery from the cardiac viewpoint.  She has significant risk.  However if we weigh all of  the pros and cons it would still be reasonable to proceed from the cardiac viewpoint.  I would not recommend cardiac catheterization.  We would not have the option to use any of the antiplatelet agents needed.  I discussed this with the patient's son and daughter.  If we get to the place that surgery is needed, she is cleared by me.  There would have to be very careful understanding between the patient and her children and the surgeon of the risks along with the desire to proceed.  Hypertension  UTI (urinary tract infection)   Willa Rough 04/17/2011 7:39 AM

## 2011-04-17 NOTE — Progress Notes (Signed)
Subjective: Pt feels better.  Wants to eat.  No N/V Denies pain.  Family & ICU RN at bedside Staying in bed.  Feels a little stronger.  Wants to talk about her family more than herself.  Objective: Vital signs in last 24 hours: Temp:  [97.8 F (36.6 C)-98.2 F (36.8 C)] 97.8 F (36.6 C) (11/05 0400) Pulse Rate:  [60-79] 70  (11/05 0900) Resp:  [15-22] 17  (11/05 0900) BP: (151-183)/(47-65) 166/47 mmHg (11/05 0900) SpO2:  [98 %-100 %] 99 % (11/05 0900) Weight:  [53.5 kg (117 lb 15.1 oz)] 117 lb 15.1 oz (53.5 kg) (11/05 0500) Last BM Date: 04/09/11  Intake/Output from previous day: 11/04 0701 - 11/05 0700 In: 1515 [I.V.:1515] Out: 3575 [Urine:825; Emesis/NG output:2750] Intake/Output this shift: Total I/O In: 60 [I.V.:60] Out: -   General: The patient still appears to be more perky.  AAOx4 in no acute distress   Lungs: Her lungs are clear to auscultation  Abd: Her abdomen is flat, nontender,  with gaseous distention of her colostomy bag.  No stool NGT in place - flushed.  Red/brown thin effluent.    Extremities: No edema, SCDs BLE  Neuro: Neurologically intact, oriented x3.  Lab Results:   [facility] 04/17/11 0538 04/16/11 1303  WBC 7.3 8.6  HGB 10.3* 10.8*  HCT 33.4* 33.7*  PLT 167 176   BMET  Basename 04/17/11 0538 04/16/11 1303  NA 149* 152*  K 3.3* 3.0*  CL 100 102  CO2 42* 41*  GLUCOSE 154* 140*  BUN 34* 37*  CREATININE 0.95 0.94  CALCIUM 9.0 8.8   PT/INR No results found for this basename: LABPROT:2,INR:2 in the last 72 hours ABG No results found for this basename: PHART:2,PCO2:2,PO2:2,HCO3:2 in the last 72 hours  Studies/Results: Dg Abd Acute W/chest  04/16/2011  *RADIOLOGY REPORT*  Clinical Data: Abdominal pain with nausea and vomiting.  Small bowel obstruction.  ACUTE ABDOMEN SERIES (ABDOMEN 2 VIEW & CHEST 1 VIEW)  Comparison: Radiographs 04/15/2011 and 04/14/2011.  CT 04/12/2011.  Findings: Nasogastric tube projects into the mid stomach.   There are persistent bilateral air space opacities with worsening air space disease and volume loss in the left lower lobe.  There are probable enlarging left greater than right pleural effusions.  The heart size is stable.  There is enteric contrast material within the colon. Small bowel distension in the pelvis has improved.  There is no evidence of free intraperitoneal air.  There are multiple surgical clips in the lower abdomen and pelvis.  IMPRESSION:  1.  Improving small bowel distension in the pelvis.  Enteric contrast has passed into the colon. 2.  Worsening pulmonary edema, left lower lobe air space disease and bilateral pleural effusions.  Consider aspiration pneumonia.  Original Report Authenticated By: Gerrianne Scale, M.D.   Dg Abd Portable 2v  04/15/2011  *RADIOLOGY REPORT*  Clinical Data: Small bowel obstruction  ABDOMEN - 2 VIEW  Comparison: 04/14/2011  Findings: Mildly prominent loops of small bowel in the left lower abdomen measuring up to 4.2 cm.  Contrast progressing into the distal transverse colon.  Surgical clips in the lower abdomen/pelvis.  No evidence of free air on the lateral decubitus view.  Degenerative changes of the visualized thoracolumbar spine.  IMPRESSION: Mildly prominent loops of small bowel in the left lower abdomen. Contrast progressing into the distal transverse colon.  No evidence of free air on the lateral decubitus view.  Original Report Authenticated By: Charline Bills, M.D.    Anti-infectives: Anti-infectives  None      Assessment/Plan:  LOS: 5 days    SBO due to most likely adhesions partially improved by + flatus & contrast into colon.  Still w high NGT output.  -try SMOG enema through the colostomy to break up/ release the contrast in the colon. -mobilize to help stimulate bowel fxn.  Consider PT/OT/SW evals for probable SNF at D/C -PPI for possible gastric erosions.  Follow Hgb -start TNA for expected NPO > 7d from admission -continue NGT  due to high volume output -VTE proph   No shock/pain/obstipation = There is currently no reason to operate on her urgently. High risk for surgery for Cardiology given recent MI & adv age.  Will reserve for last option   Gracious Renken C. 04/17/2011

## 2011-04-18 LAB — COMPREHENSIVE METABOLIC PANEL
CO2: 42 mEq/L (ref 19–32)
Calcium: 8.8 mg/dL (ref 8.4–10.5)
Creatinine, Ser: 0.95 mg/dL (ref 0.50–1.10)
GFR calc Af Amer: 63 mL/min — ABNORMAL LOW (ref >90)
GFR calc non Af Amer: 55 mL/min — ABNORMAL LOW (ref >90)
Glucose, Bld: 205 mg/dL — ABNORMAL HIGH (ref 70–99)
Sodium: 150 mEq/L — ABNORMAL HIGH (ref 135–145)
Total Protein: 5.2 g/dL — ABNORMAL LOW (ref 6.0–8.3)

## 2011-04-18 LAB — GLUCOSE, CAPILLARY
Glucose-Capillary: 182 mg/dL — ABNORMAL HIGH (ref 70–99)
Glucose-Capillary: 193 mg/dL — ABNORMAL HIGH (ref 70–99)

## 2011-04-18 LAB — CBC
Hemoglobin: 11.2 g/dL — ABNORMAL LOW (ref 12.0–15.0)
MCH: 28.8 pg (ref 26.0–34.0)
MCHC: 30.8 g/dL (ref 30.0–36.0)
MCV: 93.6 fL (ref 78.0–100.0)
RBC: 3.89 MIL/uL (ref 3.87–5.11)

## 2011-04-18 LAB — MAGNESIUM: Magnesium: 2 mg/dL (ref 1.5–2.5)

## 2011-04-18 LAB — PHOSPHORUS: Phosphorus: 3.1 mg/dL (ref 2.3–4.6)

## 2011-04-18 MED ORDER — POTASSIUM CHLORIDE 10 MEQ/50ML IV SOLN
10.0000 meq | INTRAVENOUS | Status: AC
  Administered 2011-04-18 (×4): 10 meq via INTRAVENOUS
  Filled 2011-04-18 (×5): qty 50

## 2011-04-18 MED ORDER — INSULIN REGULAR HUMAN 100 UNIT/ML IJ SOLN
INTRAVENOUS | Status: AC
  Administered 2011-04-18: 18:00:00 via INTRAVENOUS
  Filled 2011-04-18: qty 2000

## 2011-04-18 MED ORDER — FAT EMULSION 20 % IV EMUL
250.0000 mL | INTRAVENOUS | Status: AC
  Administered 2011-04-18 – 2011-04-19 (×2): 250 mL via INTRAVENOUS
  Filled 2011-04-18: qty 250

## 2011-04-18 MED ORDER — PNEUMOCOCCAL VAC POLYVALENT 25 MCG/0.5ML IJ INJ
0.5000 mL | INJECTION | INTRAMUSCULAR | Status: AC
  Administered 2011-04-19: 0.5 mL via INTRAMUSCULAR
  Filled 2011-04-18: qty 0.5

## 2011-04-18 MED ORDER — MORPHINE SULFATE 2 MG/ML IJ SOLN: 1.0000 mg | INTRAMUSCULAR | Status: DC | PRN

## 2011-04-18 MED ORDER — POTASSIUM CHLORIDE 10 MEQ/50ML IV SOLN: 10.0000 meq | INTRAVENOUS | Status: DC

## 2011-04-18 NOTE — Progress Notes (Signed)
Patient ID: EMALIA WITKOP MRN: [id number] DOB/AGE: 1927-09-13 77 y.o.  Admit date: 04/12/2011 Discharge date: 04/18/2011  Primary Care Physician:  Evlyn Courier, MD This an interim discharge summary  Discharge Diagnoses:    Present on Admission:  .SBO (small bowel obstruction) NSTEMI . Marland KitchenHypertension . Enterococcus UTI (urinary tract infection) Hypokalemia  Mild hypernatremia .  Consults:   [facility]   Significant Diagnostic Studies:  CT abdomen/pelvis 04/12/11 IMPRESSION:  1. Decompressed distal ileum with wall thickening suggesting  possible inflammatory stricture. Proximal small bowel obstruction.  2. Partial left colectomy with left lower quadrant colostomy.  3. Extensive calcific atherosclerotic changes throughout the  abdominal aorta and branch vessels with suggestion of significant  stenosis of the distal aorta and iliac bifurcation region. Poor  flow demonstrated in the external iliac arteries.  4. Small amount of upper abdominal ascites. Small pericardial  effusion.  5. Gallbladder sludge.  Dg Chest 2 View 04/17/2011  IMPRESSION: COPD.  Probable mild CHF with small effusions.    Dg Abd Acute W/chest  04/16/2011   IMPRESSION:  1.  Improving small bowel distension in the pelvis.  Enteric contrast has passed into the colon. 2.  Worsening pulmonary edema, left lower lobe air space disease and bilateral pleural effusions.  Consider aspiration pneumonia.   Brief H and P: For complete details please refer to admission H and PThis is an 77 year old Caucasian lady with a remote history of partial colectomy with permanent colostomy for  cancer of the colon about 15 years ago,   2 days  ago  she has noted she has had no output from her colostomy since emptying it on Tuesday morning. On Wednesday morning, she noted she began to have a colicky abdominal pain which she described as intermittent, sometimes severe and sometimes not associated with very  mild, but  progressive swelling of her abdomen. Eventually, as the pain persisted and she felt more and more ill, she called an ambulance and was brought to the emergency room. In the emergency room, the patient  was evaluated and had a CT scan of the abdomen done which revealed evidence of small-bowel obstruction and after contacting the surgeons, the hospitalists were contacted for primary admission.  Hospital Course:  SBO:  Was treated conservatively. Improved ,patient had a bowel movent after received an enema. Started on sips of liquids  today ,also on TNA. NGT clamp trial. CCS following. NSTEMI:  Continue medical management as per cardiology.will eventually need cath when sable.  Enterococcus UTI:  Treated with Ampicillin for 3 days ,will discontinue. Hypokalemia  Continue to replete as per pharmacy  ,chek magnesium level.  Mild hypernatremia:  Continue with  D5W currently at  [facility] .  HTN:  Uncontrolled,continue clonidine patch, IV vasotec,hydralazine and metoprolol prn.monitor and adjust meds.  Subjective: Patient seen and examined ,feeling better ,had a bowel movent after given enema.  Filed Vitals:   04/18/11 1000  BP: 154/82  Pulse: 62  Temp:   Resp: 25    General: Alert, in no acute distress. Heart: Regular rate and rhythm, without murmurs, rubs, gallops. Lungs: Clear to auscultation bilaterally. Abdomen: Soft, nontender, nondistended, positive bowel sounds.colostmy bag in place. Extremities: No clubbing cyanosis or edema with positive pedal pulses.   Disposition : Will transfer to step down unit .     Signed: Madalena Kesecker 04/18/2011, 11:40 AM

## 2011-04-18 NOTE — Progress Notes (Signed)
PARENTERAL NUTRITION CONSULT NOTE - FOLLOW UP  Pharmacy Consult for tna Indication: sbo  Allergies  Allergen Reactions  . Promethazine Other (See Comments)    Unknown     Patient Measurements: Height: 5\' 1"  (154.9 cm) (Entered for Cutover) Weight: 117 lb 15.1 oz (53.5 kg) (53.5) IBW/kg (Calculated) : 47.8  Adjusted Body Weight:  Usual Weight:  Signs: Temp: 97.7 F (36.5 C) (11/06 0800) Temp src: Oral (11/06 0800) BP: 145/57 mmHg (11/06 0800) Pulse Rate: 69  (11/06 0800) Intake/Output from previous day: 11/05 0701 - 11/06 0700 In: 2661.1 [I.V.:1184; NG/GT:30; IV Piggyback:350; TPN:697.1] Out: 2950 [Urine:900; Emesis/NG output:600; Stool:1450] Intake/Output from this shift: Total I/O In: -  Out: 250 [Stool:250]  Labs:  [facility] 04/18/11 0400 04/17/11 0538 04/16/11 1303  WBC 11.4* 7.3 8.6  HGB 11.2* 10.3* 10.8*  HCT 36.4 33.4* 33.7*  PLT 186 167 176  APTT -- -- --  INR -- -- --     Basename 04/18/11 0400 04/17/11 0538 04/16/11 1303  NA 150* 149* 152*  K 3.5 3.3* 3.0*  CL 103 100 102  CO2 42* 42* 41*  GLUCOSE 205* 154* 140*  BUN 37* 34* 37*  CREATININE 0.95 0.95 0.94  LABCREA -- -- --  CREAT24HRUR -- -- --  CALCIUM 8.8 9.0 8.8  MG 2.0 2.1 --  PHOS 3.1 2.9 --  PROT 5.2* 5.6* 5.5*  ALBUMIN 2.5* 2.7* 2.7*  AST 62* 121* 105*  ALT 44* 49* 34  ALKPHOS 43 40 37*  BILITOT 0.3 0.4 0.3  BILIDIR -- -- --  IBILI -- -- --  PREALBUMIN -- 12.8* --  CHOLHDL -- -- --  CHOL -- 184 --   Estimated Creatinine Clearance: 35 ml/min (by C-G formula based on Cr of 0.95).    Basename 04/18/11 0729 04/18/11 0345 04/17/11 2328  GLUCAP 182* 190* 151*    Medications:    Insulin Requirements in the past 24 hours:  15 units ssi  Current Nutrition:  tpn at 42 ml/hr   Assessment: Severe pcm 2nd acute illness w/ 6% recent wt loss  Nutritional Goals:  1200-1400 kCal, 65-75 grams of protein per day  Plan:  Increase to 60 ml/hr to provide 72 gm protein and 1358  kcals for 100% support, 4 k runs, add insulin 25 units/2 liter bag  Len Childs T 04/18/2011,9:00 AM

## 2011-04-18 NOTE — Progress Notes (Addendum)
[facility] - PGY II RESIDENT NOTE   Subjective:  There were no events overnight. Currently, the patient denies symptoms of  chest pain, chest pressure/discomfort, dyspnea and palpitations. complains of symptoms of LLQ abdominal pain. Of note, pt was administered and enema yesterday, with good output.   Objective:  Vital Signs:   BP 174/59  Pulse 65  Temp(Src) 97.5 F (36.4 C) (Oral)  Resp 22  Ht 5\' 1"  (1.549 m)  Wt 117 lb 15.1 oz (53.5 kg)  BMI 22.29 kg/m2  SpO2 97%    Intake/Output:  11/05 0701 - 11/06 0700 In: 2661.1 [I.V.:1184; NG/GT:30; IV Piggyback:350; TPN:697.1] Out: 2650 [Urine:700; Emesis/NG output:500; Stool:1450]   Physical Exam: General: Vital signs reviewed and noted. Well-developed,  in no acute distress; alert, appropriate and cooperative throughout examination. He seated comfortably upright in bedside chair.   Lungs:  Normal respiratory effort. Bibasilar crackles. No wheezes.   Heart: RRR. S1 and S2 normal without gallop, murmur, or rubs.  Abdomen:  BS normoactive. Soft, Nondistended. Tenderness to palpation in the left lower artery and surrounding ostomy site.   Extremities: No pretibial edema.     Labs: Basic Metabolic Panel:  Lab 04/18/11 1610 04/17/11 0538 04/16/11 1303 04/15/11 0226  NA 150* 149* 152* --  K 3.5 3.3* 3.0* --  CL 103 100 102 --  CO2 42* 42* 41* --  GLUCOSE 205* 154* 140* --  BUN 37* 34* 37* --  CREATININE 0.95 0.95 0.94 --  CALCIUM 8.8 9.0 8.8 --  MG 2.0 2.1 -- 2.0  PHOS 3.1 2.9 -- --    Liver Function Tests:  Lab 04/18/11 0400 04/17/11 0538 04/16/11 1303  AST 62* 121* 105*  ALT 44* 49* 34  ALKPHOS 43 40 37*  BILITOT 0.3 0.4 0.3  PROT 5.2* 5.6* 5.5*  ALBUMIN 2.5* 2.7* 2.7*   CBC:  Lab 04/18/11 0400 04/17/11 0538 04/16/11 1303 04/15/11 0226 04/14/11 0917 04/12/11 2004  WBC 11.4* 7.3 8.6 -- -- --  NEUTROABS -- 5.5 -- -- -- 11.2*  HGB 11.2* 10.3* 10.8* -- -- --  HCT 36.4 33.4* 33.7* -- -- --  MCV 93.6 93.6 91.3  88.5 89.8 --  PLT 186 167 176 -- -- --   CBG:  Lab 04/18/11 0345 04/17/11 2328 04/17/11 1942 04/17/11 1548 04/17/11 1155  GLUCAP 190* 151* 196* 127* 137*    Imaging: Dg Chest 2 View - 04/17/2011 - COPD.  Probable mild CHF with small effusions.    Medications:   Infusions: . sodium chloride 20 mL/hr at 04/18/11 0600  . dextrose 5 % with kcl 10 mL/hr at 04/18/11 0600  . TPN (CLINIMIX) +/- additives 42 mL/hr at 04/18/11 0600   And  . fat emulsion 250 mL (04/18/11 0600)  . nitroGLYCERIN 50 mcg/min (04/18/11 0600)    Scheduled Medications: . ampicillin (OMNIPEN) IV  1 g Intravenous Q6H  . aspirin  324 mg Oral Daily  . cloNIDine  0.1 mg Transdermal Weekly  . enalaprilat  2.5 mg Intravenous Q6H  . enoxaparin (LOVENOX) injection  30 mg Subcutaneous 1 day or 1 dose  . insulin aspart  0-9 Units Subcutaneous Q4H  . metoprolol  5 mg Intravenous Q4H  . ondansetron (ZOFRAN) IV  4 mg Intravenous Q6H  . pantoprazole (PROTONIX) IV  40 mg Intravenous Q24H  . potassium chloride  10 mEq Intravenous Q1 Hr x 4  . sodium chloride  10 mL Intracatheter Q12H  . sorbitol, milk of mag, mineral oil, glycerin (SMOG) enema  473 mL Rectal Once    PRN Medications: acetaminophen, hydrALAZINE, morphine, sodium chloride.    Assessment/ Plan:  Pt is a 77 y.o. yo female who was admitted on 04/12/2011 with SBO, who was later found to have NSTEMI based on diffuse ST depressions and up-trending troponins. At this time, interventions at this time will be focused on conservative medical management of her SBO and NSTEMI with plans for medical management until medically stable for consideration of cath.    NSTEMI: 2D echo shows EF of 60-65% and moderate LVH. Heparin drip has been discontinued 04/16/11. NTG gtt continues at this time. If medically stabilizes in regards to acute illness, we anticipate catheterization for evaluation for high grade lesion. However, if not able to resolve/ improve acute medical illness  (SBO/UTI) with current conservative management, and surgical intervention is required. We are inclined to provide surgical clearance from a cardiac standpoint with the understanding that the patient will be high-risk.  Continue ASA, B- blocker, ACE - inhibitor for now.  ACE-I escalated yesterday. Continue supportive care for now.  She will eventually need a cath after she recovers from her acute illness (SBO).  Consider dc NTG drip today.   HTN: Blood pressure remain elevated (150-170s systolic), however, Enalapril just escalated 04/17/11.   Will not further increase medications at this time, as ACE-I just increased yesterday, and primary team plans to increase Clonidine.   Continue current medications.   SBO: Still with high NG output, therefore, NG tube not yet clamped. Being treated conservatively as she is not a surgical candidate. TPN initiated (04/17/11). Per primary team and surgery consult.    UTI - Enterococcus - per primary team, treating with Ampicillin.   Hypokalemia - Resolved this morning, previously likely due to NPO status and continued NG output. Would like to keep potassium levels closer to 4. Expect stabilization as TPN initiated.   Per primary team.   This patient's case and plan of care was discussed with attending, Willa Rough, MD.    [name], D.O.  PGYII, Internal Medicine Resident 04/18/2011, 6:18 AM  I have reviewed this note completely. I agree with the outlined plan by Dr.  Thad Ranger. We'll follow the blood pressure over time. If the pressure remains elevated white choice would be to increase the ACE inhibitor as opposed to using clonidine.

## 2011-04-18 NOTE — Progress Notes (Signed)
Treatment Team: Attending Provider: Baltazar Najjar; Consulting Physician: Md Ccs; Rounding Team: Loel Ro; Dietitian: Hettie Holstein, RD; Respiratory Therapist: Valentino Nose, RRT; Technician: [facility] [place], [place]; Consulting Physician: Rounding [facility]; Respiratory Therapist: Gerome Apley, RRT  PCP: Evlyn Courier, MD     Subjective: Pt had large volume of stool s/p SMOG enema.  & gas out colostomy last shift. Pt tired but thirsty. c/o mild pain "knot" just lateral to stoma. Family & ICU RN Fara Boros) at bedside.   Objective: Vital signs in last 24 hours: Temp:  [97.5 F (36.4 C)-98.5 F (36.9 C)] 97.7 F (36.5 C) (11/06 0800) Pulse Rate:  [56-72] 69  (11/06 0800) Resp:  [11-22] 16  (11/06 0800) BP: (140-182)/(47-60) 145/57 mmHg (11/06 0800) SpO2:  [96 %-99 %] 98 % (11/06 0800) Last BM Date: 04/18/11  Intake/Output from previous day: 11/05 0701 - 11/06 0700 In: 2661.1 [I.V.:1184; NG/GT:30; IV Piggyback:350; TPN:697.1] Out: 2950 [Urine:900; Emesis/NG output:600; Stool:1450] Intake/Output this shift: Total I/O In: -  Out: 250 [Stool:250]  General: Pt awake/alert/oriented x4 in no acute distress Eyes: PERRL, normal EOM. Neuro: CN II-XII intact w/o focal sensory/motor deficits. Lymph: No head/neck/groin lymphadenopathy Psych:  No delerium/psychosis/paranoia HEENT: Normocephalic, Mucus membranes moist.  No thrush Neck: Supple, No tracheal deviation Chest: CTA bilaterally. No pain w good excursion CV:  Pulses intact.  Regular rhythm Abdomen: Soft, nontender/nondistended.  No incarcerated hernias.  Ostomy pink.  Thin serosang effluent.  +gas.  I feel no mass/hernia around stoma. Ext:  SCDs BLE.  No mjr edema.  No cyanosis Skin: No petechiae / purpurae   Lab Results:  @LABLAST2 (wbc:2,hgb:2,hct:2,plt:2) BMET  Basename 04/18/11 0400 04/17/11 0538  NA 150* 149*  K 3.5 3.3*  CL 103 100  CO2 42* 42*  GLUCOSE 205* 154*    BUN 37* 34*  CREATININE 0.95 0.95  CALCIUM 8.8 9.0     Studies/Results: Dg Chest 2 View  04/17/2011  *RADIOLOGY REPORT*  Clinical Data: Chest pain, short of breath, nausea and vomiting  CHEST - 2 VIEW  Comparison: Portable chest x-ray of 04/14/2011  Findings: The lungs are hyperaerated.  There is cardiomegaly present with mild pulmonary vascular congestion and small effusions most consistent with mild congestive heart failure.  The bones are diffusely osteopenic.  An NG tube extends below the hemidiaphragm.  IMPRESSION: COPD.  Probable mild CHF with small effusions.  Original Report Authenticated By: Juline Patch, M.D.   Dg Abd Acute W/chest  04/16/2011  *RADIOLOGY REPORT*  Clinical Data: Abdominal pain with nausea and vomiting.  Small bowel obstruction.  ACUTE ABDOMEN SERIES (ABDOMEN 2 VIEW & CHEST 1 VIEW)  Comparison: Radiographs 04/15/2011 and 04/14/2011.  CT 04/12/2011.  Findings: Nasogastric tube projects into the mid stomach.  There are persistent bilateral air space opacities with worsening air space disease and volume loss in the left lower lobe.  There are probable enlarging left greater than right pleural effusions.  The heart size is stable.  There is enteric contrast material within the colon. Small bowel distension in the pelvis has improved.  There is no evidence of free intraperitoneal air.  There are multiple surgical clips in the lower abdomen and pelvis.  IMPRESSION:  1.  Improving small bowel distension in the pelvis.  Enteric contrast has passed into the colon. 2.  Worsening pulmonary edema, left lower lobe air space disease and bilateral pleural effusions.  Consider aspiration pneumonia.  Original Report Authenticated By: Gerrianne Scale, M.D.    Anti-infectives: Anti-infectives  None      Assessment/Plan:  LOS: 6 days   Principal Problem:  *SBO (small bowel obstruction) Active Problems:  Acute myocardial infarction, subendocardial infarction, subsequent episode  of care  Hypertension  UTI (urinary tract infection)   PSBO improving s/p enema.  The patient is stable.  There is no evidence of peritonitis, acute abdomen, nor shock.  There is no strong evidence of failure of improvement nor decline with current non-operative management.  There is no need for surgery at the present moment.  We will continue to follow.  -try sips of thin liquids -mobilize -clamp NGT trial -cardiac management per IM/Cards  I discussed the patient's status to the family & ICU RN.  Questions were answered.  They expressed understanding & appreciation.   Ardeth Sportsman 04/18/2011  Ardeth Sportsman, M.D., F.A.C.S. [facility] [address], [address] [place], [place] [address] [phone] Main / Paging [phone] Voice Mail

## 2011-04-19 LAB — COMPREHENSIVE METABOLIC PANEL
ALT: 26 U/L (ref 0–35)
AST: 29 U/L (ref 0–37)
Albumin: 2.2 g/dL — ABNORMAL LOW (ref 3.5–5.2)
Alkaline Phosphatase: 40 U/L (ref 39–117)
BUN: 31 mg/dL — ABNORMAL HIGH (ref 6–23)
Calcium: 8.6 mg/dL (ref 8.4–10.5)
Chloride: 96 mEq/L (ref 96–112)
Creatinine, Ser: 0.88 mg/dL (ref 0.50–1.10)
GFR calc Af Amer: 74 mL/min — ABNORMAL LOW (ref >90)
Glucose, Bld: 625 mg/dL (ref 70–99)
Potassium: 4.9 mEq/L (ref 3.5–5.1)
Sodium: 134 mEq/L — ABNORMAL LOW (ref 135–145)
Total Bilirubin: 0.2 mg/dL — ABNORMAL LOW (ref 0.3–1.2)
Total Protein: 4.9 g/dL — ABNORMAL LOW (ref 6.0–8.3)

## 2011-04-19 LAB — CBC
HCT: 34.4 % — ABNORMAL LOW (ref 36.0–46.0)
HCT: 34.9 % — ABNORMAL LOW (ref 36.0–46.0)
Hemoglobin: 10.5 g/dL — ABNORMAL LOW (ref 12.0–15.0)
MCV: 91.6 fL (ref 78.0–100.0)
Platelets: 145 10*3/uL — ABNORMAL LOW (ref 150–400)
RBC: 3.68 MIL/uL — ABNORMAL LOW (ref 3.87–5.11)
RBC: 3.81 MIL/uL — ABNORMAL LOW (ref 3.87–5.11)
WBC: 13.3 10*3/uL — ABNORMAL HIGH (ref 4.0–10.5)
WBC: 13.3 10*3/uL — ABNORMAL HIGH (ref 4.0–10.5)

## 2011-04-19 LAB — GLUCOSE, CAPILLARY
Glucose-Capillary: 147 mg/dL — ABNORMAL HIGH (ref 70–99)
Glucose-Capillary: 189 mg/dL — ABNORMAL HIGH (ref 70–99)
Glucose-Capillary: 190 mg/dL — ABNORMAL HIGH (ref 70–99)
Glucose-Capillary: 208 mg/dL — ABNORMAL HIGH (ref 70–99)

## 2011-04-19 LAB — MAGNESIUM: Magnesium: 1.9 mg/dL (ref 1.5–2.5)

## 2011-04-19 MED ORDER — MAGNESIUM SULFATE 50 % IJ SOLN
2.0000 g | Freq: Once | INTRAVENOUS | Status: AC
  Administered 2011-04-19: 2 g via INTRAVENOUS
  Filled 2011-04-19: qty 4

## 2011-04-19 MED ORDER — TRACE MINERALS CR-CU-MN-SE-ZN 10-1000-500-60 MCG/ML IV SOLN
INTRAVENOUS | Status: AC
  Administered 2011-04-19: 19:00:00 via INTRAVENOUS
  Filled 2011-04-19: qty 2000

## 2011-04-19 MED ORDER — FAT EMULSION 20 % IV EMUL
250.0000 mL | INTRAVENOUS | Status: AC
  Administered 2011-04-19: 250 mL via INTRAVENOUS
  Filled 2011-04-19: qty 250

## 2011-04-19 MED ORDER — POTASSIUM CHLORIDE 10 MEQ/50ML IV SOLN
10.0000 meq | INTRAVENOUS | Status: AC
  Administered 2011-04-19 (×3): 10 meq via INTRAVENOUS
  Filled 2011-04-19 (×3): qty 50

## 2011-04-19 MED ORDER — NITROGLYCERIN 2 % TD OINT
1.0000 [in_us] | TOPICAL_OINTMENT | Freq: Four times a day (QID) | TRANSDERMAL | Status: DC
  Administered 2011-04-19 – 2011-04-26 (×27): 1 [in_us] via TOPICAL
  Filled 2011-04-19 (×2): qty 30

## 2011-04-19 MED ORDER — POTASSIUM CHLORIDE CRYS ER 20 MEQ PO TBCR: 40.0000 meq | EXTENDED_RELEASE_TABLET | ORAL | Status: AC

## 2011-04-19 NOTE — Progress Notes (Signed)
[facility] CARDIOLOGY - PGY II RESIDENT NOTE   Subjective:  There were no events overnight. Currently, the patient denies symptoms of chest pain, chest pressure/discomfort, dyspnea, irregular heart beat and palpitations. she confirms symptoms of abdominal discomfort, better than before. States she tolerated sitting up in her bedside chair well overnight.   Objective:  Vital Signs:   BP 161/54  Pulse 64  Temp(Src) 97.3 F (36.3 C) (Oral)  Resp 19  Ht 5\' 1"  (1.549 m)  Wt 117 lb 15.1 oz (53.5 kg)  BMI 22.29 kg/m2  SpO2 99%    Intake/Output:  11/06 0701 - 11/07 0700 In: 2749 [P.O.:360; I.V.:830; IV Piggyback:302; TPN:1257] Out: 950 [Urine:550; Emesis/NG output:50; Stool:350]    Physical Exam: General: Vital signs reviewed and noted. Well-developed, well-nourished, in no acute distress; alert, appropriate and cooperative throughout examination. Pt is much more awake, alert, cheerful this morning.   Lungs:  Normal respiratory effort. Clear to auscultation BL without crackles or wheezes.  Heart: RRR. S1 and S2 normal without gallop, or rubs. (+) systolic murmur.  Abdomen:  BS normoactive. Soft, Nondistended.  No masses or organomegaly. Tenderness to palpation surrounding LLQ colostomy site.  Extremities: No pretibial edema.     Labs: Basic Metabolic Panel:  Lab 04/19/11 4401 04/18/11 0400 04/17/11 0538 04/15/11 0226  NA 134* 150* 149* --  K 4.9 3.5 3.3* --  CL 96 103 100 --  CO2 34* 42* 42* --  GLUCOSE 625* --> fingerstick 190 205* 154* --  BUN 31* 37* 34* --  CREATININE 0.84 0.95 0.95 --  CALCIUM 8.4 8.8 9.0 --  MG 1.9 2.0 2.1 2.0  PHOS -- 3.1 2.9 --    Liver Function Tests:  Lab 04/19/11 0419 04/18/11 0400 04/17/11 0538  AST 28 62* 121*  ALT 26 44* 49*  ALKPHOS 40 43 40  BILITOT 0.2* 0.3 0.4  PROT 4.6* 5.2* 5.6*  ALBUMIN 2.1* 2.5* 2.7*  CBC:  Lab 04/19/11 0419 04/18/11 0400 04/17/11 0538  WBC 13.3* 11.4* 7.3  NEUTROABS -- -- 5.5  HGB 10.5* 11.2* 10.3*  HCT  34.4* 36.4 33.4*  MCV 93.5 93.6 93.6  PLT 146* 186 167    CBG:  Lab 04/19/11 0414 04/18/11 2339 04/18/11 1944 04/18/11 1633 04/18/11 1226  GLUCAP 189* 190* 183* 172* 193*    Imaging: 1) Dg Chest 2 View (04/17/11): COPD.  Probable mild CHF with small effusions.  Original Report Authenticated By: Juline Patch, M.D.      Medications:  Infusions: . sodium chloride 20 mL/hr at 04/19/11 0500  . dextrose 5 % with kcl 20 mL/hr at 04/19/11 0500  . TPN (CLINIMIX) +/- additives 42 mL/hr at 04/18/11 1700   And  . fat emulsion 250 mL (04/18/11 1700)  . TPN (CLINIMIX) +/- additives 60 mL/hr at 04/19/11 0400   And  . fat emulsion 500 kcal (04/19/11 0400)  . DISCONTD: nitroGLYCERIN 50 mcg/min (04/18/11 0600)    Scheduled Medications: . aspirin  324 mg Oral Daily  . cloNIDine  0.1 mg Transdermal Weekly  . enalaprilat  2.5 mg Intravenous Q6H  . enoxaparin (LOVENOX) injection  30 mg Subcutaneous 1 day or 1 dose  . insulin aspart  0-9 Units Subcutaneous Q4H  . metoprolol  5 mg Intravenous Q4H  . ondansetron (ZOFRAN) IV  4 mg Intravenous Q6H  . pantoprazole (PROTONIX) IV  40 mg Intravenous Q24H  . pneumococcal 23 valent vaccine  0.5 mL Intramuscular Tomorrow-1000  . potassium chloride  10 mEq Intravenous Q1 Hr  x 4  . sodium chloride  10 mL Intracatheter Q12H    PRN Medications: acetaminophen, hydrALAZINE, morphine, sodium chloride     Assessment/ Plan:  Pt is a 77 y.o. yo female who was admitted on 04/12/2011 with SBO, who was later found to have NSTEMI based on diffuse ST depressions and up-trending troponins. At this time, interventions at this time will be focused on conservative medical management of her SBO and NSTEMI with plans for medical management until medically stable for consideration of cath.    NSTEMI: 2D echo (04/14/11) shows EF of 60-65% and moderate LVH. Heparin drip discontinued (04/16/11). NTG gtt discontinued (04/18/11). If medically stabilizes in regards to acute  illness, we anticipate catheterization for evaluation for high grade lesion. However, if not able to resolve/ improve acute medical illness (SBO/UTI) with current conservative management, and surgical intervention is required, we are inclined to provide surgical clearance from a cardiac standpoint with the understanding that the patient will be high-risk.   Continue ASA, B- blocker, ACE - inhibitor for now.   ACE-I escalated 04/17/11, can consider further escalation as blood pressure remain elevated.   Ideally, once tolerating oral medications, pt will be able to have IV antihypertensives transitioned to oral, with adjustment of her regimen based on oral medications.  Continue supportive care for now.   She will eventually need a cath after she recovers from her acute illness (SBO).  Will add nitro patch.    HTN: Blood pressure remain elevated (140-170s systolic). Enalapril just escalated to 2.5mg  q6h on 04/17/11.   Continue current medications.  Add Nitro patch.   SBO: TPN initiated (04/17/11). NG clamped 04/18/11. Being treated conservatively as she is not thought to be a good surgical candidate. Nurse reports bloody output from ostomy overnight.  Per primary team and surgery consult.    UTI - Enterococcus - per primary team, completed treatment with Ampicillin.   Hypokalemia - Resolved. previously likely due to NPO status and continued NG output. Would like to keep potassium levels closer to 4. Expect stabilization as TPN initiated.   Per primary team.   Pt's case and plan of care was discussed with attending, Dr. Peter [place].    [name], Norman Herrlich, Internal Medicine Resident 04/19/2011, 6:13 AM

## 2011-04-19 NOTE — Progress Notes (Signed)
PARENTERAL NUTRITION CONSULT NOTE - FOLLOW UP  Pharmacy Consult for tna Indication: sbo  Allergies  Allergen Reactions  . Promethazine Other (See Comments)    Unknown     Patient Measurements: Height: 5\' 1"  (154.9 cm) (Entered for Cutover) Weight: 117 lb 15.1 oz (53.5 kg) IBW/kg (Calculated) : 47.8  Adjusted Body Weight:  Usual Weight:  Signs: Temp: 98.4 F (36.9 C) (11/07 0814) BP: 161/54 mmHg (11/07 0400) Pulse Rate: 64  (11/07 0600) Intake/Output from previous day: 11/06 0701 - 11/07 0700 In: 3030 [P.O.:360; I.V.:910; IV Piggyback:302; TPN:1458] Out: 950 [Urine:550; Emesis/NG output:50; Stool:350] Intake/Output from this shift: Total I/O In: 244 [I.V.:80; NG/GT:30; TPN:134] Out: 20 [Emesis/NG output:20]  Labs:  [facility] 04/19/11 0545 04/19/11 0419 04/18/11 0400  WBC 13.3* 13.3* 11.4*  HGB 11.0* 10.5* 11.2*  HCT 34.9* 34.4* 36.4  PLT 145* 146* 186  APTT -- -- --  INR -- -- --     Basename 04/19/11 0545 04/19/11 0419 04/18/11 0400 04/17/11 0538  NA 139 134* 150* --  K 3.5 4.9 3.5 --  CL 99 96 103 --  CO2 36* 34* 42* --  GLUCOSE 181* 625* 205* --  BUN 33* 31* 37* --  CREATININE 0.88 0.84 0.95 --  LABCREA -- -- -- --  CREAT24HRUR -- -- -- --  CALCIUM 8.6 8.4 8.8 --  MG 1.7 1.9 2.0 --  PHOS -- -- 3.1 2.9  PROT 4.9* 4.6* 5.2* --  ALBUMIN 2.2* 2.1* 2.5* --  AST 29 28 62* --  ALT 26 26 44* --  ALKPHOS 42 40 43 --  BILITOT 0.2* 0.2* 0.3 --  BILIDIR -- -- -- --  IBILI -- -- -- --  PREALBUMIN -- -- -- 12.8*  CHOLHDL -- -- -- --  CHOL -- -- -- 184   Estimated Creatinine Clearance: 37.8 ml/min (by C-G formula based on Cr of 0.88).    Basename 04/19/11 0813 04/19/11 0643 04/19/11 0414  GLUCAP 175* 160* 189*    Medications:    Insulin Requirements in the past 24 hours:  Around 12 units ssi  Current Nutrition:  Full support w/ tpn + fat  Assessment: Severe pcm 2nd acute illness w/ 6% recent wt loss  Nutritional Goals:  1200-1400 kCal, 65-75  grams of protein per day  Plan:  continue 60 ml/hr to provide 72 gm protein and 1358 kcals for 100% support, 4 k runs, increase insulin 45 units/2 liter bag, 3 runs kcl and 2 gram magnesium bolus  Len Childs T 04/19/2011,9:38 AM

## 2011-04-19 NOTE — Progress Notes (Signed)
  Subjective: Pt complaint of minimal abdominal soreness today.  Some flatus.  Feels hungry. No n/v with tube clamped.   Objective: Vital signs in last 24 hours: Temp:  [97.3 F (36.3 C)-98.7 F (37.1 C)] 98.4 F (36.9 C) (11/07 0814) Pulse Rate:  [58-72] 64  (11/07 0600) Resp:  [11-25] 19  (11/07 0500) BP: (146-173)/(47-82) 161/54 mmHg (11/07 0400) SpO2:  [97 %-100 %] 99 % (11/07 0600) Weight:  [53.5 kg (117 lb 15.1 oz)-54.6 kg (120 lb 5.9 oz)] 117 lb 15.1 oz (53.5 kg) (11/07 0437) Last BM Date: 04/18/11  Intake/Output from previous day: 11/06 0701 - 11/07 0700 In: 3030 [P.O.:360; I.V.:910; IV Piggyback:302; TPN:1458] Out: 950 [Urine:550; Emesis/NG output:50; Stool:350] Intake/Output this shift: Total I/O In: 244 [I.V.:80; NG/GT:30; TPN:134] Out: 20 [Emesis/NG output:20]  PE: Abd: soft, minimally tender, ND, +BS, ostomy with serous output and flatus HT: regular Lungs: CTAB  Lab Results:   Basename 04/19/11 0545 04/19/11 0419  WBC 13.3* 13.3*  HGB 11.0* 10.5*  HCT 34.9* 34.4*  PLT 145* 146*   BMET  Basename 04/19/11 0545 04/19/11 0419  NA 139 134*  K 3.5 4.9  CL 99 96  CO2 36* 34*  GLUCOSE 181* 625*  BUN 33* 31*  CREATININE 0.88 0.84  CALCIUM 8.6 8.4   PT/INR No results found for this basename: LABPROT:2,INR:2 in the last 72 hours   Studies/Results: @RISRSLT2 @  Anti-infectives: Anti-infectives     Start     Dose/Rate Route Frequency Ordered Stop   04/15/11 1400   ampicillin (OMNIPEN) 1 g in sodium chloride 0.9 % 50 mL IVPB  Status:  Discontinued        1 g 150 mL/hr over 20 Minutes Intravenous Every 6 hours 04/15/11 1045 04/18/11 1205           Assessment/Plan  1.SBO, resolving 2. MI  Plan:  1. Will d/c NGT 2. Allow clears 3. Con't to follow   LOS: 7 days    OSBORNE,KELLY E 04/19/2011  The patient is stable.  There is no evidence of peritonitis, acute abdomen, nor shock.  There is no strong evidence of failure of improvement nor  decline with current non-operative management.  There is no need for surgery at the present moment.  We will continue to follow.

## 2011-04-19 NOTE — Progress Notes (Signed)
Patient seen and examined. Agree with resident's exam, assessment and plan. Will add nitrolpaste for anti-ischemic benefit and BP. Overall clinical status seems to be gradually improving. As long as she continues to improve will defer cardiac cath until SBO resolved. Theron Arista [place]

## 2011-04-19 NOTE — Progress Notes (Signed)
Subjective: Patient doing better, no chest pain, no nausea or vomiting and no fever.   Objective: Vital signs in last 24 hours: Temp:  [97.3 F (36.3 C)-98.7 F (37.1 C)] 98.4 F (36.9 C) (11/07 0814) Pulse Rate:  [58-72] 64  (11/07 0600) Resp:  [11-25] 19  (11/07 0500) BP: (146-173)/(47-82) 161/54 mmHg (11/07 0400) SpO2:  [97 %-100 %] 99 % (11/07 0600) Weight:  [53.5 kg (117 lb 15.1 oz)-54.6 kg (120 lb 5.9 oz)] 117 lb 15.1 oz (53.5 kg) (11/07 0437) Weight change:  Last BM Date: 04/18/11  Intake/Output from previous day: 11/06 0701 - 11/07 0700 In: 3030 [P.O.:360; I.V.:910; IV Piggyback:302; TPN:1458] Out: 950 [Urine:550; Emesis/NG output:50; Stool:350] Total I/O In: 110 [I.V.:80; NG/GT:30] Out: -    Physical Exam: General: Alert, awake, oriented x3, in no acute distress. HEENT: No bruits, no goiter. Heart: Regular rate and rhythm, (+) SM, no rubs or gallops. Lungs: Clear to auscultation bilaterally. Abdomen: Soft, nontender, nondistended, positive bowel sounds. Extremities: No clubbing cyanosis or edema with positive pedal pulses. Neuro: Grossly intact, non focal deficits.  Lab Results: Basic Metabolic Panel:  Basename 04/19/11 0545 04/19/11 0419 04/18/11 0400 04/17/11 0538  NA 139 134* -- --  K 3.5 4.9 -- --  CL 99 96 -- --  CO2 36* 34* -- --  GLUCOSE 181* 625* -- --  BUN 33* 31* -- --  CREATININE 0.88 0.84 -- --  CALCIUM 8.6 8.4 -- --  MG 1.7 1.9 -- --  PHOS -- -- 3.1 2.9   Liver Function Tests:  [facility] 04/19/11 0545 04/19/11 0419  AST 29 28  ALT 26 26  ALKPHOS 42 40  BILITOT 0.2* 0.2*  PROT 4.9* 4.6*  ALBUMIN 2.2* 2.1*   CBC:  Basename 04/19/11 0545 04/19/11 0419 04/17/11 0538  WBC 13.3* 13.3* --  NEUTROABS -- -- 5.5  HGB 11.0* 10.5* --  HCT 34.9* 34.4* --  MCV 91.6 93.5 --  PLT 145* 146* --   BNP: No results found for this basename: POCBNP:3 in the last 72 hours D-Dimer: No results found for this basename: DDIMER:2 in the last 72  hours CBG:  Basename 04/19/11 0813 04/19/11 0643 04/19/11 0414 04/18/11 2339 04/18/11 1944 04/18/11 1633  GLUCAP 175* 160* 189* 190* 183* 172*   Hemoglobin A1C: No results found for this basename: HGBA1C in the last 72 hours Fasting Lipid Panel:  Basename 04/17/11 0538  CHOL 184  HDL --  LDLCALC --  TRIG 216*  CHOLHDL --  LDLDIRECT --   Misc. Labs:  Recent Results (from the past 240 hour(s))  URINE CULTURE     Status: Normal   Collection Time   04/12/11  9:28 PM      Component Value Range Status Comment   Specimen Description URINE, CLEAN CATCH   Final    Special Requests NONE   Final    Setup Time [id number]   Final    Colony Count 80,000 COLONIES/ML   Final    Culture ENTEROCOCCUS SPECIES   Final    Report Status 04/15/2011 FINAL   Final    Organism ID, Bacteria ENTEROCOCCUS SPECIES   Final   MRSA PCR SCREENING     Status: Normal   Collection Time   04/14/11  9:43 AM      Component Value Range Status Comment   MRSA by PCR NEGATIVE  NEGATIVE  Final     Studies/Results: Dg Chest 2 View  04/17/2011  *RADIOLOGY REPORT*  Clinical Data: Chest pain, short  of breath, nausea and vomiting  CHEST - 2 VIEW  Comparison: Portable chest x-ray of 04/14/2011  Findings: The lungs are hyperaerated.  There is cardiomegaly present with mild pulmonary vascular congestion and small effusions most consistent with mild congestive heart failure.  The bones are diffusely osteopenic.  An NG tube extends below the hemidiaphragm.  IMPRESSION: COPD.  Probable mild CHF with small effusions.  Original Report Authenticated By: Juline Patch, M.D.    Medications: Scheduled Meds:   . aspirin  324 mg Oral Daily  . cloNIDine  0.1 mg Transdermal Weekly  . enalaprilat  2.5 mg Intravenous Q6H  . enoxaparin (LOVENOX) injection  30 mg Subcutaneous 1 day or 1 dose  . insulin aspart  0-9 Units Subcutaneous Q4H  . magnesium sulfate infusion  2 g Intravenous Once  . metoprolol  5 mg Intravenous Q4H  .  nitroGLYCERIN  1 inch Topical Q6H  . ondansetron (ZOFRAN) IV  4 mg Intravenous Q6H  . pantoprazole (PROTONIX) IV  40 mg Intravenous Q24H  . pneumococcal 23 valent vaccine  0.5 mL Intramuscular Tomorrow-1000  . potassium chloride  10 mEq Intravenous Q1 Hr x 4  . potassium chloride  10 mEq Intravenous Q1 Hr x 3  . sodium chloride  10 mL Intracatheter Q12H  . DISCONTD: ampicillin (OMNIPEN) IV  1 g Intravenous Q6H   Continuous Infusions:   . sodium chloride 20 mL/hr at 04/19/11 0900  . dextrose 5 % with kcl 20 mL/hr at 04/19/11 0900  . TPN (CLINIMIX) +/- additives 42 mL/hr at 04/18/11 1700   And  . fat emulsion 250 mL (04/18/11 1700)  . TPN (CLINIMIX) +/- additives 60 mL/hr at 04/19/11 0900   And  . fat emulsion 500 kcal (04/19/11 0700)   PRN Meds:.acetaminophen, hydrALAZINE, morphine, sodium chloride  Assessment/Plan: 1-SBO (small bowel obstruction):significantly improved. D/C NGT, advance diet. Appreciate surgery inputs and rec's. 2-NSTEMI: Per cardiology; cath once patient more stable; currently CP free. 3-Hypertension:still mildly elevated; will follow closely' meds recently adjusted. 4-UTI (urinary tract infection):treated already. No dysuria. 5-Hypokalemia:will replete and check magnesium level. 6-hyperglycemia: secondary to TPN. Per pharmacy will adjust carbs inside TPN and will use SSI. 7-DVT prophylaxis: Continue lovenox.        LOS: 7 days   Yosef Krogh 04/19/2011, 9:29 AM

## 2011-04-19 NOTE — Clinical Documentation Improvement (Signed)
GENERIC DOCUMENTATION CLARIFICATION QUERY  Please update your documentation within the medical record to reflect your response to this query.                                                                                         04/19/11    Dear Dr.Jud Fanguy and/or  Associates,  In a better effort to capture your patient's severity of illness, reflect appropriate length of stay and utilization of resources, a review of the patient medical record has revealed the following indicators     In responding to this query please exercise your independent judgment.  The fact that a query is asked, does not imply that any particular answer is desired or expected.  "Patient with severe PCM in the context of acute illness, given recent 6% weight loss and intake less than 50% of estimated energy requirements for at least 5 days" per Registered Dietician Assessment.  If agree with Registered Dietician Assessment please document diagnosis in Progress Notes and Discharge Summary.  Thanks   Possible Clinical Conditions?  _______Severe Malnutrition  _______Protein Calorie Malnutrition  _______Malnutrition  _______Severe Protein Calorie Malnutrition  _______Other Condition_______________  _______Cannot Clinically Determine    Supporting Information:.   -Inadequate oral intake  - altered GI function - NPO status x 5days -High NGT output  Treatment: -TPN @60cc /hr -Clear Liquid diet started on 11/7  Reviewed: data reviewed and new information provided on progress note for 04/20/11  Thank You,  Sincerely, Vivia Budge RN, BSN Clinical Documentation Specialist: Pager  [phone]  [facility] [place]

## 2011-04-20 DIAGNOSIS — E46 Unspecified protein-calorie malnutrition: Secondary | ICD-10-CM | POA: Diagnosis not present

## 2011-04-20 LAB — GLUCOSE, CAPILLARY
Glucose-Capillary: 145 mg/dL — ABNORMAL HIGH (ref 70–99)
Glucose-Capillary: 182 mg/dL — ABNORMAL HIGH (ref 70–99)

## 2011-04-20 LAB — BASIC METABOLIC PANEL
CO2: 32 mEq/L (ref 19–32)
GFR calc non Af Amer: 63 mL/min — ABNORMAL LOW (ref >90)
Glucose, Bld: 146 mg/dL — ABNORMAL HIGH (ref 70–99)
Potassium: 4.2 mEq/L (ref 3.5–5.1)
Sodium: 132 mEq/L — ABNORMAL LOW (ref 135–145)

## 2011-04-20 LAB — COMPREHENSIVE METABOLIC PANEL
ALT: 18 U/L (ref 0–35)
AST: 21 U/L (ref 0–37)
Alkaline Phosphatase: 44 U/L (ref 39–117)
CO2: 32 mEq/L (ref 19–32)
Chloride: 101 mEq/L (ref 96–112)
Creatinine, Ser: 0.87 mg/dL (ref 0.50–1.10)
GFR calc non Af Amer: 61 mL/min — ABNORMAL LOW (ref >90)
Potassium: 4.5 mEq/L (ref 3.5–5.1)
Total Bilirubin: 0.2 mg/dL — ABNORMAL LOW (ref 0.3–1.2)

## 2011-04-20 LAB — CBC
Hemoglobin: 10.2 g/dL — ABNORMAL LOW (ref 12.0–15.0)
MCH: 28.9 pg (ref 26.0–34.0)
RBC: 3.53 MIL/uL — ABNORMAL LOW (ref 3.87–5.11)

## 2011-04-20 MED ORDER — LIP MEDEX EX OINT
1.0000 "application " | TOPICAL_OINTMENT | Freq: Two times a day (BID) | CUTANEOUS | Status: DC
  Administered 2011-04-20 – 2011-04-27 (×8): 1 via TOPICAL
  Filled 2011-04-20 (×2): qty 7

## 2011-04-20 MED ORDER — PANTOPRAZOLE SODIUM 40 MG PO TBEC
40.0000 mg | DELAYED_RELEASE_TABLET | Freq: Every day | ORAL | Status: DC
  Administered 2011-04-20 – 2011-04-26 (×7): 40 mg via ORAL
  Filled 2011-04-20 (×8): qty 1

## 2011-04-20 MED ORDER — PSYLLIUM 95 % PO PACK
1.0000 | PACK | Freq: Two times a day (BID) | ORAL | Status: DC
  Administered 2011-04-20 – 2011-04-26 (×12): 1 via ORAL
  Filled 2011-04-20 (×15): qty 1

## 2011-04-20 MED ORDER — FAT EMULSION 20 % IV EMUL
168.0000 mL | INTRAVENOUS | Status: DC
  Administered 2011-04-20: 168 mL via INTRAVENOUS
  Filled 2011-04-20: qty 200

## 2011-04-20 MED ORDER — FLORA-Q PO CAPS
1.0000 | ORAL_CAPSULE | Freq: Every day | ORAL | Status: DC
  Administered 2011-04-21 – 2011-04-27 (×6): 1 via ORAL
  Filled 2011-04-20 (×8): qty 1

## 2011-04-20 MED ORDER — METOPROLOL TARTRATE 25 MG PO TABS
25.0000 mg | ORAL_TABLET | Freq: Two times a day (BID) | ORAL | Status: DC
  Administered 2011-04-20: 25 mg via ORAL
  Filled 2011-04-20 (×3): qty 1

## 2011-04-20 MED ORDER — INSULIN REGULAR HUMAN 100 UNIT/ML IJ SOLN
INTRAVENOUS | Status: DC
  Administered 2011-04-20: 19:00:00 via INTRAVENOUS
  Filled 2011-04-20: qty 2000

## 2011-04-20 MED ORDER — ENALAPRIL MALEATE 20 MG PO TABS
40.0000 mg | ORAL_TABLET | Freq: Every day | ORAL | Status: DC
  Administered 2011-04-20 – 2011-04-27 (×8): 40 mg via ORAL
  Filled 2011-04-20 (×10): qty 2

## 2011-04-20 MED ORDER — BISACODYL 10 MG RE SUPP: 10.0000 mg | Freq: Two times a day (BID) | RECTAL | Status: DC | PRN

## 2011-04-20 MED ORDER — POLYETHYLENE GLYCOL 3350 17 G PO PACK
17.0000 g | PACK | Freq: Every day | ORAL | Status: DC
  Administered 2011-04-21 – 2011-04-26 (×5): 17 g via ORAL
  Filled 2011-04-20 (×7): qty 1

## 2011-04-20 MED ORDER — PANTOPRAZOLE SODIUM 40 MG PO TBEC: 40.0000 mg | DELAYED_RELEASE_TABLET | Freq: Every day | ORAL | Status: DC

## 2011-04-20 NOTE — Progress Notes (Signed)
TRANSFERRED TO [id number] BY WHEELCHAIR AWAKE AND ALERT, STABLE, REPORT GIVEN TO RN, BELONGINGS WITH PT.

## 2011-04-20 NOTE — Progress Notes (Signed)
PARENTERAL NUTRITION CONSULT NOTE - FOLLOW UP  Pharmacy Consult for TPN Indication: SBO  Allergies  Allergen Reactions  . Promethazine Other (See Comments)    Unknown     Patient Measurements: Height: 5\' 1"  (154.9 cm) (Entered for Cutover) Weight: 124 lb 5.4 oz (56.4 kg) IBW/kg (Calculated) : 47.8    Vital Signs: Temp: 97.4 F (36.3 C) (11/08 1200) Temp src: Oral (11/08 1200) BP: 112/72 mmHg (11/08 0800) Pulse Rate: 57  (11/08 0400) Intake/Output from previous day: 11/07 0701 - 11/08 0700 In: 3088.3 [P.O.:480; I.V.:991.3; NG/GT:60; IV Piggyback:150; TPN:1407] Out: 2245 [Urine:2225; Emesis/NG output:20] Intake/Output from this shift: Total I/O In: 300 [I.V.:120; TPN:180] Out: 600 [Urine:600]  Labs:  [facility] 04/20/11 0009 04/19/11 0545 04/19/11 0419  WBC 12.2* 13.3* 13.3*  HGB 10.2* 11.0* 10.5*  HCT 31.6* 34.9* 34.4*  PLT 142* 145* 146*  APTT -- -- --  INR -- -- --     Basename 04/20/11 0600 04/20/11 0009 04/19/11 0545 04/19/11 0419 04/18/11 0400  NA 137 132* 139 -- --  K 4.5 4.2 3.5 -- --  CL 101 97 99 -- --  CO2 32 32 36* -- --  GLUCOSE 142* 146* 181* -- --  BUN 29* 30* 33* -- --  CREATININE 0.87 0.84 0.88 -- --  LABCREA -- -- -- -- --  CREAT24HRUR -- -- -- -- --  CALCIUM 8.5 8.3* 8.6 -- --  MG 2.0 -- 1.7 1.9 --  PHOS 3.2 -- -- -- 3.1  PROT 5.0* -- 4.9* 4.6* --  ALBUMIN 2.1* -- 2.2* 2.1* --  AST 21 -- 29 28 --  ALT 18 -- 26 26 --  ALKPHOS 44 -- 42 40 --  BILITOT 0.2* -- 0.2* 0.2* --  BILIDIR -- -- -- -- --  IBILI -- -- -- -- --  PREALBUMIN -- -- -- -- --  CHOLHDL -- -- -- -- --  CHOL -- -- -- -- --   Estimated Creatinine Clearance: 38.3 ml/min (by C-G formula based on Cr of 0.87).    Basename 04/20/11 1211 04/20/11 0808 04/20/11 0341  GLUCAP 167* 145* 145*    Medications:  Scheduled:    . aspirin  324 mg Oral Daily  . cloNIDine  0.1 mg Transdermal Weekly  . enalaprilat  2.5 mg Intravenous Q6H  . enoxaparin (LOVENOX) injection  30 mg  Subcutaneous 1 day or 1 dose  . insulin aspart  0-9 Units Subcutaneous Q4H  . magnesium sulfate infusion  2 g Intravenous Once  . metoprolol  5 mg Intravenous Q4H  . nitroGLYCERIN  1 inch Topical Q6H  . ondansetron (ZOFRAN) IV  4 mg Intravenous Q6H  . pantoprazole  40 mg Oral Q1200  . potassium chloride  10 mEq Intravenous Q1 Hr x 3  . potassium chloride  40 mEq Oral Q4H  . sodium chloride  10 mL Intracatheter Q12H  . DISCONTD: pantoprazole (PROTONIX) IV  40 mg Intravenous Q24H    Insulin Requirements in the past 24 hours:  Receiving Sensitive SSI q4, and 45units/bag in TPN.  Pt requiring min SSI with increase insulin in TPN  Current Nutrition:  Full support on TPN & fats, providing 1358 kcal & 72 gm protein.  Assessment: 77yo female with SBO, diet advanced to Full Liquids today.  Expect will be able to wean & d/c TPN in next day or so if continues to improve & able to further advance diet.  Lytes are wnl. 1.  Continue current TPN 2.  F/U AM.  Renaee Munda 04/20/2011,1:06 PM

## 2011-04-20 NOTE — Progress Notes (Signed)
Subjective: Patient hungry and feeling much better today; tolerated clear liquids w/o difficulties and had a BM. Currently denies CP, SOB, N/V and fever. Mild pain in her belly still reported; but overall signifcantly improved.  Objective: Vital signs in last 24 hours: Temp:  [98 F (36.7 C)-99.2 F (37.3 C)] 98.9 F (37.2 C) (11/08 0400) Pulse Rate:  [57-64] 57  (11/08 0400) Resp:  [16-23] 21  (11/08 0800) BP: (112-164)/(44-72) 112/72 mmHg (11/08 0800) SpO2:  [91 %-99 %] 92 % (11/08 0400) Weight:  [56.4 kg (124 lb 5.4 oz)] 124 lb 5.4 oz (56.4 kg) (11/08 0442) Weight change: 1.8 kg (3 lb 15.5 oz) Last BM Date: 04/18/11  Intake/Output from previous day: 11/07 0701 - 11/08 0700 In: 3088.3 [P.O.:480; I.V.:991.3; NG/GT:60; IV Piggyback:150; TPN:1407] Out: 2245 [Urine:2225; Emesis/NG output:20] Total I/O In: 100 [I.V.:40; TPN:60] Out: -    Physical Exam: General: Alert, awake, oriented x3, in no acute distress. HEENT: No bruits, no goiter. Heart: Regular rate and rhythm, (+) SM, no rubs or gallops. Lungs: Clear to auscultation bilaterally. Abdomen: Soft, nondistended, positive bowel sounds; mild soreness on her ostomy site. Extremities: No clubbing cyanosis or edema with positive pedal pulses. Neuro: Grossly intact, non focal deficits.  Lab Results: Basic Metabolic Panel:  Basename 04/20/11 0600 04/20/11 0009 04/19/11 0545 04/18/11 0400  NA 137 132* -- --  K 4.5 4.2 -- --  CL 101 97 -- --  CO2 32 32 -- --  GLUCOSE 142* 146* -- --  BUN 29* 30* -- --  CREATININE 0.87 0.84 -- --  CALCIUM 8.5 8.3* -- --  MG 2.0 -- 1.7 --  PHOS 3.2 -- -- 3.1   Liver Function Tests:  Basename 04/20/11 0600 04/19/11 0545  AST 21 29  ALT 18 26  ALKPHOS 44 42  BILITOT 0.2* 0.2*  PROT 5.0* 4.9*  ALBUMIN 2.1* 2.2*   CBC:  Basename 04/20/11 0009 04/19/11 0545  WBC 12.2* 13.3*  NEUTROABS -- --  HGB 10.2* 11.0*  HCT 31.6* 34.9*  MCV 89.5 91.6  PLT 142* 145*   CBG:  Basename  04/20/11 0808 04/20/11 0341 04/19/11 2353 04/19/11 2004 04/19/11 1652 04/19/11 1259  GLUCAP 145* 145* 147* 187* 160* 208*   Misc. Labs:  Recent Results (from the past 240 hour(s))  URINE CULTURE     Status: Normal   Collection Time   04/12/11  9:28 PM      Component Value Range Status Comment   Specimen Description URINE, CLEAN CATCH   Final    Special Requests NONE   Final    Setup Time [id number]   Final    Colony Count 80,000 COLONIES/ML   Final    Culture ENTEROCOCCUS SPECIES   Final    Report Status 04/15/2011 FINAL   Final    Organism ID, Bacteria ENTEROCOCCUS SPECIES   Final   MRSA PCR SCREENING     Status: Normal   Collection Time   04/14/11  9:43 AM      Component Value Range Status Comment   MRSA by PCR NEGATIVE  NEGATIVE  Final     Medications: Scheduled Meds:    . aspirin  324 mg Oral Daily  . cloNIDine  0.1 mg Transdermal Weekly  . enalaprilat  2.5 mg Intravenous Q6H  . enoxaparin (LOVENOX) injection  30 mg Subcutaneous 1 day or 1 dose  . insulin aspart  0-9 Units Subcutaneous Q4H  . magnesium sulfate infusion  2 g Intravenous Once  . metoprolol  5 mg Intravenous Q4H  . nitroGLYCERIN  1 inch Topical Q6H  . ondansetron (ZOFRAN) IV  4 mg Intravenous Q6H  . pantoprazole (PROTONIX) IV  40 mg Intravenous Q24H  . pneumococcal 23 valent vaccine  0.5 mL Intramuscular Tomorrow-1000  . potassium chloride  10 mEq Intravenous Q1 Hr x 3  . potassium chloride  40 mEq Oral Q4H  . sodium chloride  10 mL Intracatheter Q12H   Continuous Infusions:    . sodium chloride 20 mL/hr at 04/20/11 0800  . dextrose 5 % with kcl 20 mL/hr at 04/20/11 0800  . TPN (CLINIMIX) +/- additives 60 mL/hr at 04/19/11 1500   And  . fat emulsion 500 kcal (04/19/11 1500)  . TPN (CLINIMIX) +/- additives 60 mL/hr at 04/20/11 0800   And  . fat emulsion 500 kcal (04/20/11 0800)   PRN Meds:.acetaminophen, hydrALAZINE, morphine, sodium chloride  Assessment/Plan: 1-SBO (small bowel  obstruction): significantly improved. Will advance diet to heart healthy today; patient is hungry, has tolerated clear w/o problems and had a BM last night. Will add miralax for bowel regimen. Appreciate surgery inputs and rec's. 2-NSTEMI: Per cardiology; cath once patient more stable; currently CP free. Will continue medical management. 3-Hypertension:still mildly elevated; will transition medications to by mouth today and continue adjusting as needed. Continue PRN hydralazine. 4-UTI (urinary tract infection): treated already. No dysuria. 5-Hypokalemia: Repleted. Will continue monitoring. 6-Hyperglycemia: secondary to TPN. Continue SSI. 7-protein/calorie malnutrition: secondary to NPO status due to SBO. Currently receiving TPN; will advance diet today and if tolerated will d/c TPN and provide some ensure supplementation. 7-DVT prophylaxis: Continue lovenox.        LOS: 8 days   Colletta Spillers 04/20/2011, 8:33 AM

## 2011-04-20 NOTE — Plan of Care (Signed)
Problem: Consults Goal: General Medical Patient Education See Patient Education Module for specific education. Outcome: Progressing Admitted for SBO.

## 2011-04-20 NOTE — Progress Notes (Signed)
Subjective: Feels better.  Gas and liquid stool in colostomy.   She describes a "knot" that comes up and then gas passes LUQ.   Tolerating clears well.  Objective: Vital signs in last 24 hours: Temp:  [98 F (36.7 C)-99.2 F (37.3 C)] 98.4 F (36.9 C) (11/08 0800) Pulse Rate:  [57-64] 57  (11/08 0400) Resp:  [16-23] 21  (11/08 0800) BP: (112-164)/(44-72) 112/72 mmHg (11/08 0800) SpO2:  [91 %-99 %] 92 % (11/08 0400) Weight:  [124 lb 5.4 oz (56.4 kg)] 124 lb 5.4 oz (56.4 kg) (11/08 0442) Last BM Date: 04/18/11  Intake/Output from previous day: 11/07 0701 - 11/08 0700 In: 3088.3 [P.O.:480; I.V.:991.3; NG/GT:60; IV Piggyback:150; TPN:1407] Out: 2245 [Urine:2225; Emesis/NG output:20] Intake/Output this shift: Total I/O In: 300 [I.V.:120; TPN:180] Out: 600 [Urine:600]  General appearance: alert, cooperative and no distress Eyes: conjunctivae/corneas clear. PERRL, EOM's intact. Fundi benign. Resp: clear to auscultation bilaterally GI: soft, non tender, + bowel sounds, +gas, green liquid stool in ostomy.  Lab Results:   Basename 04/20/11 0009 04/19/11 0545  WBC 12.2* 13.3*  HGB 10.2* 11.0*  HCT 31.6* 34.9*  PLT 142* 145*    BMET  Basename 04/20/11 0600 04/20/11 0009  NA 137 132*  K 4.5 4.2  CL 101 97  CO2 32 32  GLUCOSE 142* 146*  BUN 29* 30*  CREATININE 0.87 0.84  CALCIUM 8.5 8.3*   PT/INR No results found for this basename: LABPROT:2,INR:2 in the last 72 hours   Studies/Results: No results found.  Anti-infectives: Anti-infectives     Start     Dose/Rate Route Frequency Ordered Stop   04/15/11 1400   ampicillin (OMNIPEN) 1 g in sodium chloride 0.9 % 50 mL IVPB  Status:  Discontinued        1 g 150 mL/hr over 20 Minutes Intravenous Every 6 hours 04/15/11 1045 04/18/11 1205         Current Facility-Administered Medications  Medication Dose Route Frequency Provider Last Rate Last Dose  . 0.45 % sodium chloride infusion   Intravenous Continuous  Melissa L Taylor 20 mL/hr at 04/20/11 1000    . acetaminophen (TYLENOL) tablet 650 mg  650 mg Oral Q4H PRN Sosan Abdullah      . aspirin chewable tablet 324 mg  324 mg Oral Daily Drake Leach Rumbarger, PHARMD   324 mg at 04/20/11 0945  . cloNIDine (CATAPRES - Dosed in mg/24 hr) patch 0.1 mg  0.1 mg Transdermal Weekly Sosan Abdullah      . dextrose 5 % 1,000 mL with potassium chloride 20 mEq infusion   Intravenous Continuous Sosan Abdullah 20 mL/hr at 04/20/11 1000    . enalaprilat (VASOTEC) injection 2.5 mg  2.5 mg Intravenous Q6H M Shelly Kalia-Reynolds   2.5 mg at 04/20/11 7829  . enoxaparin (LOVENOX) injection 30 mg  30 mg Subcutaneous 1 day or 1 dose Luis Abed, MD   30 mg at 04/19/11 1010  . tpn solution (CLINIMIX E 5/15) 2,000 mL with insulin regular 25 Units infusion   Intravenous Continuous TPN Herby Abraham, PHARMD 60 mL/hr at 04/19/11 1500     And  . fat emulsion 20 % infusion 250 mL  250 mL Intravenous Continuous TPN Herby Abraham, PHARMD 7 mL/hr at 04/19/11 1500 500 kcal at 04/19/11 1500  . tpn solution (CLINIMIX E 5/15) 2,000 mL with multivitamins adult 10 mL, trace elements Cr-Cu-Mn-Se-Zn 1 mL, insulin regular 45 Units infusion   Intravenous Continuous TPN Herby Abraham, PHARMD  60 mL/hr at 04/20/11 1000     And  . fat emulsion 20 % infusion 250 mL  250 mL Intravenous Continuous TPN Herby Abraham, PHARMD 7 mL/hr at 04/20/11 1000 50 kcal at 04/20/11 1000  . hydrALAZINE (APRESOLINE) injection 10 mg  10 mg Intravenous Q2H PRN Sosan Abdullah      . insulin aspart (novoLOG) injection 0-9 Units  0-9 Units Subcutaneous Q4H Manson Passey, MD   1 Units at 04/20/11 0849  . magnesium sulfate 2 g in dextrose 5 % 250 mL infusion  2 g Intravenous Once Herby Abraham, PHARMD 125 mL/hr at 04/19/11 1145 2 g at 04/19/11 1145  . metoprolol (LOPRESSOR) injection 5 mg  5 mg Intravenous Q4H Drake Leach Rumbarger, PHARMD   5 mg at 04/20/11 0851  . morphine 4 MG/ML injection 1-2 mg  1-2 mg  Intravenous Q3H PRN Sosan Abdullah      . nitroGLYCERIN (NITROGLYN) 2 % ointment 1 inch  1 inch Topical Q6H M Shelly Kalia-Reynolds   1 inch at 04/20/11 0607  . ondansetron (ZOFRAN) injection 4 mg  4 mg Intravenous Q6H Sosan Abdullah   4 mg at 04/20/11 1610  . pantoprazole (PROTONIX) injection 40 mg  40 mg Intravenous Q24H Ardeth Sportsman, MD   40 mg at 04/20/11 0946  . potassium chloride 10 mEq in 50 mL *CENTRAL LINE* IVPB  10 mEq Intravenous Q1 Hr x 3 Herby Abraham, PHARMD   10 mEq at 04/19/11 1241  . potassium chloride SA (K-DUR,KLOR-CON) CR tablet 40 mEq  40 mEq Oral Q4H Vassie Loll, MD      . sodium chloride 0.9 % injection 10 mL  10 mL Intracatheter Q12H Sosan Abdullah   10 mL at 04/20/11 1000  . sodium chloride 0.9 % injection 10 mL  10 mL Intracatheter PRN Sosan Abdullah   10 mL at 04/19/11 0555    Assessment/Plan 1. SBO  2. S/P Partial Colectomy & Hx of SBO. 3.MI. Patient Active Problem List  Diagnoses  . Acute myocardial infarction, subendocardial infarction, subsequent episode of care  . History of colectomy  . Hypertension  . SBO (small bowel obstruction)  . UTI (urinary tract infection)  . Protein calorie malnutrition  Plan: Full liquids   LOS: 8 days    JENNINGS,WILLARD 04/20/2011  ATTENDING ADDENDUM:  I personally reviewed patient's record, examined patient, and agree with the following plan:  -adv diet slowly -Bowel regimen -mobilize  The patient is stable.  There is no evidence of peritonitis, acute abdomen, nor shock.  There is no strong evidence of failure of improvement nor decline with current non-operative management.  There is no need for surgery at the present moment.  We will continue to follow.   Ardeth Sportsman, M.D., F.A.C.S. Gastrointestinal and Minimally Invasive [facility] [address] [place], [place] [address] [phone] Main / Paging [phone] Voice Mail

## 2011-04-20 NOTE — Progress Notes (Signed)
Subjective: Mild abdominal pain; no chest pain; no SOB   Physical Exam:   Blood pressure 164/46, pulse 57, temperature 98.9 F (37.2 C), temperature source Oral, resp. rate 21, height 5\' 1"  (1.549 m), weight 124 lb 5.4 oz (56.4 kg), SpO2 92.00%. Temp:  [98 F (36.7 C)-99.2 F (37.3 C)] 98.9 F (37.2 C) (11/08 0400) Pulse Rate:  [57-64] 57  (11/08 0400) Resp:  [16-23] 21  (11/08 0400) BP: (139-164)/(44-72) 164/46 mmHg (11/08 0400) SpO2:  [91 %-99 %] 92 % (11/08 0400) Weight:  [124 lb 5.4 oz (56.4 kg)] 124 lb 5.4 oz (56.4 kg) (11/08 0442) Weight change: 3 lb 15.5 oz (1.8 kg) I/O 11/07 0701 - 11/08 0700 In: 3088.3 [P.O.:480; I.V.:991.3; NG/GT:60; IV Piggyback:150; TPN:1407] Out: 2245 [Urine:2225; Emesis/NG output:20]  HEENT-normal/normal eyelids Neck supple chest - CTA/ normal expansion CV - RRR/normal S1 and S2; no  rubs or gallops; 1/6 systolic murmur Abdomen -NT/ND, no HSM, no mass, + bowel sounds, s/p colostomy 2+ femoral pulses, no bruits Ext-no edema, chords, 2+ DP Neuro-grossly nonfocal  Results for orders placed during the hospital encounter of 04/12/11 (from the past 48 hour(s))  GLUCOSE, CAPILLARY     Status: Abnormal   Collection Time   04/18/11 12:26 PM      Component Value Range Comment   Glucose-Capillary 193 (*) 70 - 99 (mg/dL)   GLUCOSE, CAPILLARY     Status: Abnormal   Collection Time   04/18/11  4:33 PM      Component Value Range Comment   Glucose-Capillary 172 (*) 70 - 99 (mg/dL)   GLUCOSE, CAPILLARY     Status: Abnormal   Collection Time   04/18/11  7:44 PM      Component Value Range Comment   Glucose-Capillary 183 (*) 70 - 99 (mg/dL)   GLUCOSE, CAPILLARY     Status: Abnormal   Collection Time   04/18/11 11:39 PM      Component Value Range Comment   Glucose-Capillary 190 (*) 70 - 99 (mg/dL)   GLUCOSE, CAPILLARY     Status: Abnormal   Collection Time   04/19/11  4:14 AM      Component Value Range Comment   Glucose-Capillary 189 (*) 70 - 99 (mg/dL)     CBC     Status: Abnormal   Collection Time   04/19/11  4:19 AM      Component Value Range Comment   WBC 13.3 (*) 4.0 - 10.5 (K/uL)    RBC 3.68 (*) 3.87 - 5.11 (MIL/uL)    Hemoglobin 10.5 (*) 12.0 - 15.0 (g/dL)    HCT 40.9 (*) 81.1 - 46.0 (%)    MCV 93.5  78.0 - 100.0 (fL)    MCH 28.5  26.0 - 34.0 (pg)    MCHC 30.5  30.0 - 36.0 (g/dL)    RDW 91.4  78.2 - 95.6 (%)    Platelets 146 (*) 150 - 400 (K/uL)   COMPREHENSIVE METABOLIC PANEL     Status: Abnormal   Collection Time   04/19/11  4:19 AM      Component Value Range Comment   Sodium 134 (*) 135 - 145 (mEq/L)    Potassium 4.9  3.5 - 5.1 (mEq/L)    Chloride 96  96 - 112 (mEq/L)    CO2 34 (*) 19 - 32 (mEq/L)    Glucose, Bld 625 (*) 70 - 99 (mg/dL)    BUN 31 (*) 6 - 23 (mg/dL)    Creatinine, Ser 2.13  0.50 -  1.10 (mg/dL)    Calcium 8.4  8.4 - 10.5 (mg/dL)    Total Protein 4.6 (*) 6.0 - 8.3 (g/dL)    Albumin 2.1 (*) 3.5 - 5.2 (g/dL)    AST 28  0 - 37 (U/L)    ALT 26  0 - 35 (U/L)    Alkaline Phosphatase 40  39 - 117 (U/L)    Total Bilirubin 0.2 (*) 0.3 - 1.2 (mg/dL)    GFR calc non Af Amer 63 (*) >90 (mL/min)    GFR calc Af Amer 74 (*) >90 (mL/min)   MAGNESIUM     Status: Normal   Collection Time   04/19/11  4:19 AM      Component Value Range Comment   Magnesium 1.9  1.5 - 2.5 (mg/dL)   COMPREHENSIVE METABOLIC PANEL     Status: Abnormal   Collection Time   04/19/11  5:45 AM      Component Value Range Comment   Sodium 139  135 - 145 (mEq/L)    Potassium 3.5  3.5 - 5.1 (mEq/L) DELTA CHECK NOTED   Chloride 99  96 - 112 (mEq/L)    CO2 36 (*) 19 - 32 (mEq/L)    Glucose, Bld 181 (*) 70 - 99 (mg/dL)    BUN 33 (*) 6 - 23 (mg/dL)    Creatinine, Ser 1.61  0.50 - 1.10 (mg/dL)    Calcium 8.6  8.4 - 10.5 (mg/dL)    Total Protein 4.9 (*) 6.0 - 8.3 (g/dL)    Albumin 2.2 (*) 3.5 - 5.2 (g/dL)    AST 29  0 - 37 (U/L)    ALT 26  0 - 35 (U/L)    Alkaline Phosphatase 42  39 - 117 (U/L)    Total Bilirubin 0.2 (*) 0.3 - 1.2 (mg/dL)    GFR  calc non Af Amer 60 (*) >90 (mL/min)    GFR calc Af Amer 70 (*) >90 (mL/min)   MAGNESIUM     Status: Normal   Collection Time   04/19/11  5:45 AM      Component Value Range Comment   Magnesium 1.7  1.5 - 2.5 (mg/dL)   CBC     Status: Abnormal   Collection Time   04/19/11  5:45 AM      Component Value Range Comment   WBC 13.3 (*) 4.0 - 10.5 (K/uL)    RBC 3.81 (*) 3.87 - 5.11 (MIL/uL)    Hemoglobin 11.0 (*) 12.0 - 15.0 (g/dL)    HCT 09.6 (*) 04.5 - 46.0 (%)    MCV 91.6  78.0 - 100.0 (fL)    MCH 28.9  26.0 - 34.0 (pg)    MCHC 31.5  30.0 - 36.0 (g/dL)    RDW 40.9  81.1 - 91.4 (%)    Platelets 145 (*) 150 - 400 (K/uL)   GLUCOSE, CAPILLARY     Status: Abnormal   Collection Time   04/19/11  6:43 AM      Component Value Range Comment   Glucose-Capillary 160 (*) 70 - 99 (mg/dL)   GLUCOSE, CAPILLARY     Status: Abnormal   Collection Time   04/19/11  8:13 AM      Component Value Range Comment   Glucose-Capillary 175 (*) 70 - 99 (mg/dL)   GLUCOSE, CAPILLARY     Status: Abnormal   Collection Time   04/19/11 12:59 PM      Component Value Range Comment   Glucose-Capillary 208 (*) 70 -  99 (mg/dL)   GLUCOSE, CAPILLARY     Status: Abnormal   Collection Time   04/19/11  4:52 PM      Component Value Range Comment   Glucose-Capillary 160 (*) 70 - 99 (mg/dL)   GLUCOSE, CAPILLARY     Status: Abnormal   Collection Time   04/19/11  8:04 PM      Component Value Range Comment   Glucose-Capillary 187 (*) 70 - 99 (mg/dL)    Comment 1 Notify RN      Comment 2 Documented in Chart     GLUCOSE, CAPILLARY     Status: Abnormal   Collection Time   04/19/11 11:53 PM      Component Value Range Comment   Glucose-Capillary 147 (*) 70 - 99 (mg/dL)    Comment 1 Notify RN      Comment 2 Documented in Chart     BASIC METABOLIC PANEL     Status: Abnormal   Collection Time   04/20/11 12:09 AM      Component Value Range Comment   Sodium 132 (*) 135 - 145 (mEq/L) DELTA CHECK NOTED   Potassium 4.2  3.5 - 5.1  (mEq/L)    Chloride 97  96 - 112 (mEq/L)    CO2 32  19 - 32 (mEq/L)    Glucose, Bld 146 (*) 70 - 99 (mg/dL)    BUN 30 (*) 6 - 23 (mg/dL)    Creatinine, Ser 2.13  0.50 - 1.10 (mg/dL)    Calcium 8.3 (*) 8.4 - 10.5 (mg/dL)    GFR calc non Af Amer 63 (*) >90 (mL/min)    GFR calc Af Amer 74 (*) >90 (mL/min)   CBC     Status: Abnormal   Collection Time   04/20/11 12:09 AM      Component Value Range Comment   WBC 12.2 (*) 4.0 - 10.5 (K/uL)    RBC 3.53 (*) 3.87 - 5.11 (MIL/uL)    Hemoglobin 10.2 (*) 12.0 - 15.0 (g/dL)    HCT 08.6 (*) 57.8 - 46.0 (%)    MCV 89.5  78.0 - 100.0 (fL)    MCH 28.9  26.0 - 34.0 (pg)    MCHC 32.3  30.0 - 36.0 (g/dL)    RDW 46.9  62.9 - 52.8 (%)    Platelets 142 (*) 150 - 400 (K/uL)   GLUCOSE, CAPILLARY     Status: Abnormal   Collection Time   04/20/11  3:41 AM      Component Value Range Comment   Glucose-Capillary 145 (*) 70 - 99 (mg/dL)    Comment 1 Notify RN      Comment 2 Documented in Chart     COMPREHENSIVE METABOLIC PANEL     Status: Abnormal   Collection Time   04/20/11  6:00 AM      Component Value Range Comment   Sodium 137  135 - 145 (mEq/L)    Potassium 4.5  3.5 - 5.1 (mEq/L)    Chloride 101  96 - 112 (mEq/L)    CO2 32  19 - 32 (mEq/L)    Glucose, Bld 142 (*) 70 - 99 (mg/dL)    BUN 29 (*) 6 - 23 (mg/dL)    Creatinine, Ser 4.13  0.50 - 1.10 (mg/dL)    Calcium 8.5  8.4 - 10.5 (mg/dL)    Total Protein 5.0 (*) 6.0 - 8.3 (g/dL)    Albumin 2.1 (*) 3.5 - 5.2 (g/dL)    AST 21  0 - 37 (U/L)    ALT 18  0 - 35 (U/L)    Alkaline Phosphatase 44  39 - 117 (U/L)    Total Bilirubin 0.2 (*) 0.3 - 1.2 (mg/dL)    GFR calc non Af Amer 61 (*) >90 (mL/min)    GFR calc Af Amer 70 (*) >90 (mL/min)   MAGNESIUM     Status: Normal   Collection Time   04/20/11  6:00 AM      Component Value Range Comment   Magnesium 2.0  1.5 - 2.5 (mg/dL)   PHOSPHORUS     Status: Normal   Collection Time   04/20/11  6:00 AM      Component Value Range Comment   Phosphorus 3.2  2.3  - 4.6 (mg/dL)     No results found.  Assessment/Plan Patient Active Hospital Problem List: Pt is a 77 y.o. yo female who was admitted on 04/12/2011 with SBO, who was later found to have NSTEMI based on diffuse ST depressions and up-trending troponins. At this time, interventions at this time will be focused on conservative medical management of her SBO and NSTEMI with plans for medical management until medically stable for consideration of cath.  NSTEMI: 2D echo (04/14/11) shows EF of 60-65% and moderate LVH. Heparin drip discontinued (04/16/11). NTG gtt discontinued (04/18/11). If medically stabilizes in regards to acute illness, we anticipate catheterization for evaluation for high grade lesion. However, if not able to resolve/ improve acute medical illness (SBO/UTI) with current conservative management, and surgical intervention is required, we are inclined to provide surgical clearance from a cardiac standpoint with the understanding that the patient will be high-risk. Possible cath early next week if she continues to improve. Patient can be transferred to telemetry from cardiac standpoint. Continue ASA, B- blocker, ACE - inhibitor for now.  ACE-I escalated 04/17/11, can consider further escalation as blood pressure remain elevated.  Ideally, once tolerating oral medications, pt will be able to have IV antihypertensives transitioned to oral, with adjustment of her regimen based on oral medications.  Continue supportive care for now.  She will eventually need a cath after she recovers from her acute illness (SBO).  HTN: Continue present meds Continue current medications.  SBO: TPN initiated (04/17/11). NG clamped 04/18/11. Being treated conservatively as she is not thought to be a good surgical candidate.  Per primary team and surgery consult.  UTI - Enterococcus - per primary team, completed treatment with Ampicillin.   Olga Millers MD 04/20/2011, 7:43 AM

## 2011-04-21 LAB — GLUCOSE, CAPILLARY: Glucose-Capillary: 140 mg/dL — ABNORMAL HIGH (ref 70–99)

## 2011-04-21 MED ORDER — INSULIN ASPART 100 UNIT/ML ~~LOC~~ SOLN: 0.0000 [IU] | Freq: Every day | SUBCUTANEOUS | Status: DC

## 2011-04-21 MED ORDER — METOPROLOL TARTRATE 50 MG PO TABS
50.0000 mg | ORAL_TABLET | Freq: Two times a day (BID) | ORAL | Status: DC
  Administered 2011-04-21 – 2011-04-24 (×7): 50 mg via ORAL
  Filled 2011-04-21 (×9): qty 1

## 2011-04-21 MED ORDER — SODIUM CHLORIDE 0.9 % IV SOLN
INTRAVENOUS | Status: DC
  Administered 2011-04-21 – 2011-04-22 (×2): via INTRAVENOUS
  Administered 2011-04-23: 20 mL/h via INTRAVENOUS

## 2011-04-21 MED ORDER — INSULIN ASPART 100 UNIT/ML ~~LOC~~ SOLN
0.0000 [IU] | Freq: Three times a day (TID) | SUBCUTANEOUS | Status: DC
  Administered 2011-04-21 – 2011-04-22 (×3): 2 [IU] via SUBCUTANEOUS
  Administered 2011-04-23: 1 [IU] via SUBCUTANEOUS
  Administered 2011-04-25: 2 [IU] via SUBCUTANEOUS
  Administered 2011-04-26 (×3): 1 [IU] via SUBCUTANEOUS

## 2011-04-21 MED ORDER — ENSURE CLINICAL ST REVIGOR PO LIQD
237.0000 mL | Freq: Two times a day (BID) | ORAL | Status: DC
  Administered 2011-04-21 – 2011-04-22 (×2): 237 mL via ORAL

## 2011-04-21 MED ORDER — CLONIDINE HCL 0.2 MG PO TABS
0.2000 mg | ORAL_TABLET | Freq: Two times a day (BID) | ORAL | Status: DC
  Administered 2011-04-21 – 2011-04-27 (×12): 0.2 mg via ORAL
  Filled 2011-04-21 (×14): qty 1

## 2011-04-21 NOTE — Progress Notes (Signed)
     Subjective: Pt without complaints.  Tolerating regular breakfast.  Ostomy with air and stool.  Objective: Vital signs in last 24 hours: Temp:  [97.4 F (36.3 C)-98.8 F (37.1 C)] 98.5 F (36.9 C) (11/09 0455) Pulse Rate:  [66-70] 70  (11/09 0455) Resp:  [16-20] 18  (11/09 0455) BP: (112-184)/(61-75) 173/71 mmHg (11/09 0455) SpO2:  [94 %-98 %] 94 % (11/09 0455) Weight:  [130 lb 1.1 oz (59 kg)] 130 lb 1.1 oz (59 kg) (11/09 0455) Last BM Date: 04/18/11  Intake/Output from previous day: 11/08 0701 - 11/09 0700 In: 1941.5 [P.O.:120; I.V.:600; TPN:1221.5] Out: 700 [Urine:600; Stool:100] Intake/Output this shift:    PE: Abd: soft, NT, ND, +BS, ostomy with air and stool  Lab Results:   Basename 04/20/11 0009 04/19/11 0545  WBC 12.2* 13.3*  HGB 10.2* 11.0*  HCT 31.6* 34.9*  PLT 142* 145*   BMET  Basename 04/20/11 0600 04/20/11 0009  NA 137 132*  K 4.5 4.2  CL 101 97  CO2 32 32  GLUCOSE 142* 146*  BUN 29* 30*  CREATININE 0.87 0.84  CALCIUM 8.5 8.3*   PT/INR No results found for this basename: LABPROT:2,INR:2 in the last 72 hours   Studies/Results: @RISRSLT2 @  Anti-infectives: Anti-infectives     Start     Dose/Rate Route Frequency Ordered Stop   04/15/11 1400   ampicillin (OMNIPEN) 1 g in sodium chloride 0.9 % 50 mL IVPB  Status:  Discontinued        1 g 150 mL/hr over 20 Minutes Intravenous Every 6 hours 04/15/11 1045 04/18/11 1205           Assessment/Plan  1. PSBO, resolved  Plan: pt currently tolerating a regular diet.  No surgical intervention needed.  Will sign off.   LOS: 9 days    Grayce Budden E 04/21/2011

## 2011-04-21 NOTE — Progress Notes (Signed)
Pharmacy: TNA: Pt tolerated regular breakfast and lunch today. Will D/C TPN after bag completed today and change SSI to ACHS. Len Childs T 04/21/2011

## 2011-04-21 NOTE — Progress Notes (Signed)
Subjective: Feeling a lot better. No abdominal pain, no N/V and tolerating PO intake. Good BM's  Objective: Vital signs in last 24 hours: Temp:  [98.2 F (36.8 C)-98.8 F (37.1 C)] 98.6 F (37 C) (11/09 1400) Pulse Rate:  [66-70] 70  (11/09 1400) Resp:  [16-19] 19  (11/09 1400) BP: (163-184)/(61-85) 163/85 mmHg (11/09 1400) SpO2:  [94 %-98 %] 94 % (11/09 0455) Weight:  [59 kg (130 lb 1.1 oz)] 130 lb 1.1 oz (59 kg) (11/09 0455) Weight change: 2.6 kg (5 lb 11.7 oz) Last BM Date: 04/18/11  Intake/Output from previous day: 11/08 0701 - 11/09 0700 In: 1941.5 [P.O.:120; I.V.:600; TPN:1221.5] Out: 700 [Urine:600; Stool:100] Total I/O In: 480 [P.O.:480] Out: -    Physical Exam: General: Alert, awake, oriented x3, in no acute distress. HEENT: No bruits, no goiter. Heart: Regular rate and rhythm, + sytsolic murmurs, rubs, gallops. Lungs: Clear to auscultation bilaterally. Abdomen: Soft, nontender, nondistended, positive bowel sounds; colostomy bag with some stool inside. Extremities: No clubbing cyanosis or edema with positive pedal pulses. Neuro: Grossly intact, nonfocal.    Lab Results: Basic Metabolic Panel:  Basename 04/20/11 0600 04/20/11 0009 04/19/11 0545  NA 137 132* --  K 4.5 4.2 --  CL 101 97 --  CO2 32 32 --  GLUCOSE 142* 146* --  BUN 29* 30* --  CREATININE 0.87 0.84 --  CALCIUM 8.5 8.3* --  MG 2.0 -- 1.7  PHOS 3.2 -- --   Liver Function Tests:  Basename 04/20/11 0600 04/19/11 0545  AST 21 29  ALT 18 26  ALKPHOS 44 42  BILITOT 0.2* 0.2*  PROT 5.0* 4.9*  ALBUMIN 2.1* 2.2*   CBC:  Basename 04/20/11 0009 04/19/11 0545  WBC 12.2* 13.3*  NEUTROABS -- --  HGB 10.2* 11.0*  HCT 31.6* 34.9*  MCV 89.5 91.6  PLT 142* 145*   CBG:  Basename 04/21/11 1131 04/21/11 0749 04/21/11 0427 04/20/11 2329 04/20/11 2057 04/20/11 1606  GLUCAP 161* 140* 172* 144* 182* 160*   Misc. Labs:  Recent Results (from the past 240 hour(s))  URINE CULTURE     Status:  Normal   Collection Time   04/12/11  9:28 PM      Component Value Range Status Comment   Specimen Description URINE, CLEAN CATCH   Final    Special Requests NONE   Final    Setup Time [id number]   Final    Colony Count 80,000 COLONIES/ML   Final    Culture ENTEROCOCCUS SPECIES   Final    Report Status 04/15/2011 FINAL   Final    Organism ID, Bacteria ENTEROCOCCUS SPECIES   Final   MRSA PCR SCREENING     Status: Normal   Collection Time   04/14/11  9:43 AM      Component Value Range Status Comment   MRSA by PCR NEGATIVE  NEGATIVE  Final     Studies/Results: No results found.  Medications: Scheduled Meds:    . aspirin  324 mg Oral Daily  . cloNIDine  0.2 mg Oral BID  . enalapril  40 mg Oral Daily  . enoxaparin (LOVENOX) injection  30 mg Subcutaneous 1 day or 1 dose  . Flora-Q  1 capsule Oral Daily  . insulin aspart  0-5 Units Subcutaneous QHS  . insulin aspart  0-9 Units Subcutaneous TID WC  . lip balm  1 application Topical BID  . metoprolol tartrate  50 mg Oral BID  . nitroGLYCERIN  1 inch Topical Q6H  .  ondansetron (ZOFRAN) IV  4 mg Intravenous Q6H  . pantoprazole  40 mg Oral Q1200  . polyethylene glycol  17 g Oral Daily  . psyllium  1 packet Oral BID  . sodium chloride  10 mL Intracatheter Q12H  . DISCONTD: cloNIDine  0.1 mg Transdermal Weekly  . DISCONTD: enalaprilat  2.5 mg Intravenous Q6H  . DISCONTD: insulin aspart  0-9 Units Subcutaneous Q4H  . DISCONTD: metoprolol  5 mg Intravenous Q4H  . DISCONTD: metoprolol tartrate  25 mg Oral BID  . DISCONTD: pantoprazole  40 mg Oral Q1200   Continuous Infusions:   . sodium chloride    . TPN (CLINIMIX) +/- additives 60 mL/hr at 04/20/11 1600   And  . fat emulsion 500 kcal (04/20/11 1600)  . DISCONTD: sodium chloride 20 mL/hr (04/21/11 1525)  . DISCONTD: dextrose 5 % with kcl 20 mL/hr at 04/20/11 1700  . DISCONTD: fat emulsion 168 mL (04/20/11 1928)  . DISCONTD: TPN (CLINIMIX) +/- additives 60 mL/hr at 04/20/11 2300    PRN Meds:.acetaminophen, bisacodyl, hydrALAZINE, morphine, sodium chloride  Assessment/Plan: 1-SBO (small bowel obstruction): Now Resolved. D/C TPN, continue bowel regimen and current diet. 2-NSTEMI: Per cardiology; cath next week. Will follow results and further recommendations. Meanwhile will continue medical management. 3-Hypertension:Metoprolol increase to 50 mg bid per cardiology recommendations; patient also was taking clonidine 0.2mg  bid at home; will restart that dose for better control of her BP. Will d/c clonidine patch, patient now tolerating po's fine. 5-Hyperglycemia: secondary to TPN. Continue SSI. 6-protein/calorie malnutrition: will d/c TPN and provide some ensure supplementation. 7-DVT prophylaxis: Continue lovenox.   LOS: 9 days   Monica Rhodes 04/21/2011, 3:46 PM

## 2011-04-21 NOTE — Progress Notes (Signed)
Subjective:  Monica Rhodes is an elderly female who is admitted with a small bowel obstruction. She had positive cardiac enzymes consistent with a non-ST segment elevation myocardial infarction.  Over the Past several days she has markedly improved. She was eating breakfast this morning. She's not having any episodes of chest pain and she's breathing very comfortably.  She also has a urinary tract infection. All of these medical issues are being managed by the medical team.     . aspirin  324 mg Oral Daily  . cloNIDine  0.1 mg Transdermal Weekly  . enalapril  40 mg Oral Daily  . enoxaparin (LOVENOX) injection  30 mg Subcutaneous 1 day or 1 dose  . Flora-Q  1 capsule Oral Daily  . insulin aspart  0-9 Units Subcutaneous Q4H  . lip balm  1 application Topical BID  . metoprolol tartrate  25 mg Oral BID  . nitroGLYCERIN  1 inch Topical Q6H  . ondansetron (ZOFRAN) IV  4 mg Intravenous Q6H  . pantoprazole  40 mg Oral Q1200  . pantoprazole  40 mg Oral Q1200  . polyethylene glycol  17 g Oral Daily  . psyllium  1 packet Oral BID  . sodium chloride  10 mL Intracatheter Q12H  . DISCONTD: enalaprilat  2.5 mg Intravenous Q6H  . DISCONTD: metoprolol  5 mg Intravenous Q4H  . DISCONTD: pantoprazole (PROTONIX) IV  40 mg Intravenous Q24H      . sodium chloride 20 mL/hr at 04/21/11 0600  . TPN (CLINIMIX) +/- additives 60 mL/hr at 04/20/11 2300   And  . fat emulsion 168 mL (04/20/11 1928)  . TPN (CLINIMIX) +/- additives 60 mL/hr at 04/20/11 1600   And  . fat emulsion 500 kcal (04/20/11 1600)  . DISCONTD: dextrose 5 % with kcl 20 mL/hr at 04/20/11 1700    Objective:  Vital Signs in the last 24 hours: Blood pressure 173/71, pulse 70, temperature 98.5 F (36.9 C), temperature source Oral, resp. rate 18, height 5\' 1"  (1.549 m), weight 130 lb 1.1 oz (59 kg), SpO2 94.00%. Temp:  [97.4 F (36.3 C)-98.8 F (37.1 C)] 98.5 F (36.9 C) (11/09 0455) Pulse Rate:  [66-70] 70  (11/09 0455) Resp:   [16-21] 18  (11/09 0455) BP: (112-184)/(61-75) 173/71 mmHg (11/09 0455) SpO2:  [94 %-98 %] 94 % (11/09 0455) Weight:  [130 lb 1.1 oz (59 kg)] 130 lb 1.1 oz (59 kg) (11/09 0455)  Intake/Output from previous day: 11/08 0701 - 11/09 0700 In: 1941.5 [P.O.:120; I.V.:600; TPN:1221.5] Out: 700 [Urine:600; Stool:100] Intake/Output from this shift:    Physical Exam: The patient is alert and oriented x 3.  The mood and affect are normal.   Skin: warm and dry.  Color is normal.    HEENT:   the sclera are nonicteric.  The mucous membranes are moist.  The carotids are 2+ without bruits.  There is no thyromegaly.  There is no JVD.    Lungs: clear.  The chest wall is non tender.    Heart: regular rate with a normal S1 and S2.  There are no murmurs, gallops, or rubs. The PMI is not displaced.     Abdomen: good bowel sounds.  There is no guarding or rebound.  There is no hepatosplenomegaly or tenderness.  There are no masses.   Extremities:  no clubbing, cyanosis, or edema.  The legs are without rashes.  The distal pulses are intact.   Neuro:  Cranial nerves II - XII are intact.  Motor and sensory functions are intact.     Lab Results:  Basename 04/20/11 0009 04/19/11 0545  WBC 12.2* 13.3*  HGB 10.2* 11.0*  PLT 142* 145*    Basename 04/20/11 0600 04/20/11 0009  NA 137 132*  K 4.5 4.2  CL 101 97  CO2 32 32  GLUCOSE 142* 146*  BUN 29* 30*  CREATININE 0.87 0.84   No results found for this basename: TROPONINI:2,CK,MB:2 in the last 72 hours No results found for this basename: BNP in the last 72 hours Hepatic Function Panel  Basename 04/20/11 0600  PROT 5.0*  ALBUMIN 2.1*  AST 21  ALT 18  ALKPHOS 44  BILITOT 0.2*  BILIDIR --  IBILI --   Lab Results  Component Value Date   CHOL 184 04/17/2011   TRIG 216* 04/17/2011   No results found for this basename: PROTIME in the last 72 hours   Assessment/Plan:   Assessment/Plan  Patient Active Hospital Problem List: Pt is a 77 y.o.  yo female who was admitted on 04/12/2011 with SBO, who was later found to have NSTEMI based on diffuse ST depressions and up-trending troponins. At this time, interventions at this time will be focused on conservative medical management of her SBO and NSTEMI with plans for medical management until medically stable for consideration of cath.  NSTEMI: 2D echo (04/14/11) shows EF of 60-65% and moderate LVH. Heparin drip discontinued (04/16/11). NTG gtt discontinued (04/18/11). We'll continue to follow her. Anticipate doing a cardiac catheterization next week. She does not appear that she will need surgery for small bowel obstruction.  Continue ASA, B- blocker, ACE - inhibitor for now.  ACE-I escalated 04/17/11, can consider further escalation as blood pressure remain elevated.  Ideally, once tolerating oral medications, pt will be able to have IV antihypertensives transitioned to oral, with adjustment of her regimen based on oral medications.  Continue supportive care for now.  She will eventually need a cath after she recovers from her acute illness (SBO).  HTN: Her blood pressure continues to be elevated. We'll increase her metoprolol to 50 mg twice a day. Anticipate that we may need to add amlodipine if her blood pressure remains elevated. Continue current medications.  SBO: Plans for internal medicine team UTI - Enterococcus - per primary team, completed treatment with Ampicillin.    Vesta Mixer, Montez Hageman., MD, [facility] 04/21/2011, 7:51 AM

## 2011-04-22 LAB — BASIC METABOLIC PANEL
BUN: 30 mg/dL — ABNORMAL HIGH (ref 6–23)
Calcium: 8.8 mg/dL (ref 8.4–10.5)
Creatinine, Ser: 0.97 mg/dL (ref 0.50–1.10)
GFR calc non Af Amer: 53 mL/min — ABNORMAL LOW (ref >90)
Glucose, Bld: 118 mg/dL — ABNORMAL HIGH (ref 70–99)
Sodium: 138 mEq/L (ref 135–145)

## 2011-04-22 LAB — GLUCOSE, CAPILLARY
Glucose-Capillary: 109 mg/dL — ABNORMAL HIGH (ref 70–99)
Glucose-Capillary: 157 mg/dL — ABNORMAL HIGH (ref 70–99)
Glucose-Capillary: 162 mg/dL — ABNORMAL HIGH (ref 70–99)

## 2011-04-22 LAB — CBC
HCT: 29.2 % — ABNORMAL LOW (ref 36.0–46.0)
Hemoglobin: 9.6 g/dL — ABNORMAL LOW (ref 12.0–15.0)
MCH: 29.3 pg (ref 26.0–34.0)
MCHC: 32.9 g/dL (ref 30.0–36.0)

## 2011-04-22 MED ORDER — ENSURE CLINICAL ST REVIGOR PO LIQD
237.0000 mL | Freq: Every day | ORAL | Status: DC
  Administered 2011-04-25: 237 mL via ORAL

## 2011-04-22 MED ORDER — ONDANSETRON HCL 4 MG/2ML IJ SOLN: 4.0000 mg | Freq: Four times a day (QID) | INTRAMUSCULAR | Status: DC | PRN

## 2011-04-22 NOTE — Consult Note (Signed)
Monica Rhodes, HEENAN              ACCOUNT NO.:  [id number]  MEDICAL RECORD NO.:  [id number]  LOCATION:  2902                         FACILITY:  [facility]  PHYSICIAN:  Luis Abed, MD, FACCDATE OF BIRTH:  1927-08-07  DATE OF CONSULTATION: DATE OF DISCHARGE:                                CONSULTATION   PRIMARY CARDIOLOGIST:  New to [facility] being seen by Dr. Myrtis Ser.  PRIMARY CARE PROVIDER:  Renaye Rakers, MD.  PATIENT PROFILE:  An 77 year old female without prior cardiac history, who was admitted to the hospital on October 31 with recurrent small- bowel obstruction, and has since developed right arm pain and type 2 non ST elevation MI. 1. Non ST-segment elevation myocardial infarction.     a.     December 05, 2005, 2D echocardiogram, EF 55%-60% with small      pericardial effusion. 2. Hypertension. 3. Hyperlipidemia. 4. Diabetes mellitus. 5. Ongoing tobacco abuse. 6. COPD. 7. Carotid stenosis/peripheral vascular disease.     a.     On December 27, 2001, carotid ultrasound showing subtotal or      total occlusion of left internal carotid artery.  No evidence of      significant stenosis involving the right internal carotid artery.      Asymmetry in blood pressures in the upper extremities suggesting      left subclavian stenosis.     b.     Suggestion of significant stenosis in distal aorta and iliac      bifurcation region on CT angiography of the abdomen and pelvis      performed this admission. 8. History of small-bowel obstruction. 9.     a.     Lysis of adhesions in 2007.     b.     April 13, 2011, CT of the abdomen and pelvis showing      decompressed distal ileum with wall thickening suggestive of      possible inflammatory stricture and proximal small bowel      obstruction.  This suggestion of significant stenosis.  The distal      aorta and iliac bifurcation which appeared poor flow distribution      into the external iliac arteries.  Gallbladder  sludge.  ALLERGIES:  Phenergan.  HISTORY:  An 77 year old female without prior cardiac history.  Risk factors include hypertension, hyperlipidemia, diabetes, tobacco abuse history.  Over the past few weeks/months, she has experienced dyspnea on exertion with usual activities.  She denies chest pain.  Generally, however, she has very limited activity at home.  The patient was admitted on October 31, with nausea, vomiting, abdominal pain.  She had a CT of the abdomen and pelvis, which showed small bowel obstruction, and question of an inflammatory stricture.  She has been seen by General surgery with initial plans for conservative management though consideration of taking to the OR has been undertaken depending on how she does.  Last night, the patient developed acute onset of right arm discomfort. She also had nausea and vomiting which was felt to be secondary to small bowel obstruction.  EKG was performed.  The setting of right arm discomfort showing significant diffuse ST-segment depression.  Cardiac enzymes  were sent, and she has trended up now with a peak CK of 595 and MB of 85.2, troponin-I of 5.78.  The patient is currently pain free.  CURRENT MEDICATIONS: 1. Aspirin 81 mg rectally daily. 2. Rocephin 1 g IV daily. 3. Vasotec 0.625 mg IV q.6 h. 4. NovoLog q.4 hours. 5. Lopressor 5 mg IV q.6 hours. 6. Zofran 4 mg IV q.6 hours.  FAMILY HISTORY:  Both parents died of coronary artery disease.  SOCIAL HISTORY:  The patient lives in [place] with her son.  She was housewife.  She is widowed.  She smokes half pack a day and has done so for about 30 years.  She denies alcohol or drug.  She is not active.  REVIEW OF SYSTEMS:  Notable for right arm pain which is now resolved. She has nausea, vomiting, abdominal discomfort.  She has had dyspnea on exertion over the past several weeks to months.  She does note bilateral lower extremity claudication with ambulation.  She had  abdominal pain which is improved.  She has a colostomy.  She is diabetic.  She has been feeling hot and cold since admission.  She is a full code. Otherwise, all systems are negative.  PHYSICAL EXAM:  VITAL SIGNS: Temp 99.1, heart rate 91, respirations 19, blood pressure 154/73, pulse ox anywhere between 90% and 94% on room air.  Weight is 54.43 kg. GENERAL: Pleasant white female, in no acute distress.  Awake and alert x3.  She has normal affect. HEENT: Notable for being very hard of hearing.  Otherwise normal. NEURO: Grossly intact.  Nonfocal. SKIN:  Warm, dry, without lesions or masses. NECK:  Supple with right carotid bruit.  No JVD. LUNGS:  Respirations were unlabored.  Clear to auscultation. CARDIAC:  Regular S1, S2.  No S3, S4, murmurs. ABDOMEN: Round, soft, diffusely tender.  Left lower quadrant colostomy. Bowel sounds are hypoactive. EXTREMITIES:  Warm, dry, pink.  No appreciable palpable pulses.  No evidence of ulceration.  CT of the abdomen and pelvis showed decompressed distal ileum with wall thickening, suggestive of possible flutter restrictions, possible small bowel obstruction.  Also significant stenosis distal aorta and iliac bifurcation region.  The EKG shows sinus rhythm at 98, normal axis, and diffuse 2 to 3 mm ST segment depression.  Hemoglobin 13.2, hematocrit 40.5, WBC 13.4, platelets 255.  Sodium 146, potassium 3.7, chloride 106, CO2 of 26, BUN 24, creatinine 1.07, glucose 149, total bilirubin 0.3, alkaline phosphatase 41, AST 17, ALT 6 and digoxin 0.6, calcium 8.6.  CK 595, MB 85.2, troponin-I 5.78.  ASSESSMENT: 1. Non-ST elevation MI, likely type 2 in a presumed vasculopathy.  She     is pain-free.  Agree with rectal aspirin, beta-blocker, ACE     inhibitor, heparin.  We will plan to add statin when she takes p.o.     Repeat EKG this morning and also check a 2D echocardiogram.     Transfer to the unit.  Obviously she is at high risk for any     surgical  intervention at this point.  We will consider coronary     angiography depending upon clinical status. 2. Peripheral vascular disease, history of claudication with distal     aortic disease on CTA.  Conservative therapy     for now. 3. Tobacco abuse.  Cessation advised. 4. Small bowel obstruction per general surgery.     Nicolasa Ducking, ANP   ______________________________ Luis Abed, MD, [facility]    CB/MEDQ  D:  04/14/2011  T:  04/14/2011  Job:  [id number]  Electronically Signed by Nicolasa Ducking ANP on 04/15/2011 02:09:52 PM Electronically Signed by Willa Rough MD FACC on 04/22/2011 06:33:42 AM

## 2011-04-22 NOTE — Progress Notes (Signed)
  SUBJECTIVE:              Patient looks quite good today. She fortunately does not remember a lot about the earlier part of her admission. I discussed with her and one of her sons the fact that we will plan to proceed with cardiac catheterization. I am hopeful that she will not need intervention, but we will have to wait and see.   Filed Vitals:   04/22/11 0005 04/22/11 0100 04/22/11 0400 04/22/11 0700  BP: 170/57 160/88 139/60 147/77  Pulse: 62  60 63  Temp: 98.5 F (36.9 C)  97.8 F (36.6 C) 98.3 F (36.8 C)  TempSrc:    Oral  Resp: 18  18 16   Height:      Weight:   134 lb 7.7 oz (61 kg)   SpO2: 94%  95% 95%    Intake/Output Summary (Last 24 hours) at 04/22/11 0849 Last data filed at 04/22/11 0600  Gross per 24 hour  Intake 1592.33 ml  Output    450 ml  Net 1142.33 ml    LABS: Basic Metabolic Panel:  Basename 04/22/11 0510 04/20/11 0600  NA 138 137  K 4.5 4.5  CL 106 101  CO2 27 32  GLUCOSE 118* 142*  BUN 30* 29*  CREATININE 0.97 0.87  CALCIUM 8.8 8.5  MG 1.7 2.0  PHOS -- 3.2   Liver Function Tests:  Basename 04/20/11 0600  AST 21  ALT 18  ALKPHOS 44  BILITOT 0.2*  PROT 5.0*  ALBUMIN 2.1*   No results found for this basename: LIPASE:2,AMYLASE:2 in the last 72 hours CBC:  Basename 04/22/11 0510 04/20/11 0009  WBC 7.7 12.2*  NEUTROABS -- --  HGB 9.6* 10.2*  HCT 29.2* 31.6*  MCV 89.0 89.5  PLT 154 142*   Cardiac Enzymes: No results found for this basename: CKTOTAL:3,CKMB:3,CKMBINDEX:3,TROPONINI:3 in the last 72 hours BNP: No results found for this basename: POCBNP:3 in the last 72 hours D-Dimer: No results found for this basename: DDIMER:2 in the last 72 hours Hemoglobin A1C: No results found for this basename: HGBA1C in the last 72 hours Fasting Lipid Panel: No results found for this basename: CHOL,HDL,LDLCALC,TRIG,CHOLHDL,LDLDIRECT in the last 72 hours Thyroid Function Tests: No results found for this basename:  TSH,T4TOTAL,FREET3,T3FREE,THYROIDAB in the last 72 hours  RADIOLOGY:    PHYSICAL EXAM     Patient is oriented to person time and place. Affect is normal. Her son is in the room. Lungs reveal scattered rhonchi. Cardiac exam reveals S1 and S2. There are no clicks or significant murmurs.  TELEMETRY:  Telemetry is reviewed by me. She has sinus rhythm.  ASSESSMENT AND PLAN:  Principal Problem:  *SBO (small bowel obstruction) Active Problems:  Acute myocardial infarction, subendocardial infarction, subsequent episode of care        The patient will undergo cardiac catheterization. I will discuss this further with her in the orders will be written tomorrow. We will plan to proceed with catheterization on Monday.  Hypertension  UTI (urinary tract infection)  Protein calorie malnutrition   Willa Rough 04/22/2011 8:49 AM

## 2011-04-22 NOTE — Progress Notes (Signed)
Subjective: Feeling a lot better. No abdominal pain, no N/V and tolerating PO intake.  Objective: Vital signs in last 24 hours: Temp:  [97.4 F (36.3 C)-98.6 F (37 C)] 97.5 F (36.4 C) (11/10 1210) Pulse Rate:  [55-72] 55  (11/10 1210) Resp:  [16-19] 16  (11/10 1210) BP: (130-170)/(57-88) 130/76 mmHg (11/10 1210) SpO2:  [94 %-97 %] 96 % (11/10 1210) Weight:  [61 kg (134 lb 7.7 oz)] 134 lb 7.7 oz (61 kg) (11/10 0400) Weight change: 2 kg (4 lb 6.6 oz) Last BM Date: 04/22/11  Intake/Output from previous day: 11/09 0701 - 11/10 0700 In: 1832.3 [P.O.:1555; I.V.:255.3; IV Piggyback:22] Out: 450 [Urine:450]     Physical Exam: General: Alert, awake, oriented x3, in no acute distress. HEENT: No bruits, no goiter. Heart: Regular rate and rhythm, + sytsolic murmurs, rubs, gallops. Lungs: Clear to auscultation bilaterally. Abdomen: Soft, nontender, nondistended, positive bowel sounds; colostomy bag with some stool inside. Extremities: No clubbing cyanosis or edema with positive pedal pulses. Neuro: Grossly intact, nonfocal.    Lab Results: Basic Metabolic Panel:  Basename 04/22/11 0510 04/20/11 0600  NA 138 137  K 4.5 4.5  CL 106 101  CO2 27 32  GLUCOSE 118* 142*  BUN 30* 29*  CREATININE 0.97 0.87  CALCIUM 8.8 8.5  MG 1.7 2.0  PHOS -- 3.2   Liver Function Tests:  Basename 04/20/11 0600  AST 21  ALT 18  ALKPHOS 44  BILITOT 0.2*  PROT 5.0*  ALBUMIN 2.1*   CBC:  Basename 04/22/11 0510 04/20/11 0009  WBC 7.7 12.2*  NEUTROABS -- --  HGB 9.6* 10.2*  HCT 29.2* 31.6*  MCV 89.0 89.5  PLT 154 142*   CBG:  Basename 04/22/11 1129 04/22/11 0741 04/22/11 0430 04/22/11 04/21/11 1956 04/21/11 1658  GLUCAP 157* 109* 122* 174* 184* 151*   Misc. Labs:  Recent Results (from the past 240 hour(s))  URINE CULTURE     Status: Normal   Collection Time   04/12/11  9:28 PM      Component Value Range Status Comment   Specimen Description URINE, CLEAN CATCH   Final    Special Requests NONE   Final    Setup Time [id number]   Final    Colony Count 80,000 COLONIES/ML   Final    Culture ENTEROCOCCUS SPECIES   Final    Report Status 04/15/2011 FINAL   Final    Organism ID, Bacteria ENTEROCOCCUS SPECIES   Final   MRSA PCR SCREENING     Status: Normal   Collection Time   04/14/11  9:43 AM      Component Value Range Status Comment   MRSA by PCR NEGATIVE  NEGATIVE  Final     Studies/Results: No results found.  Medications: Scheduled Meds:    . aspirin  324 mg Oral Daily  . cloNIDine  0.2 mg Oral BID  . enalapril  40 mg Oral Daily  . enoxaparin (LOVENOX) injection  30 mg Subcutaneous 1 day or 1 dose  . feeding supplement  237 mL Oral BID  . Flora-Q  1 capsule Oral Daily  . insulin aspart  0-5 Units Subcutaneous QHS  . insulin aspart  0-9 Units Subcutaneous TID WC  . lip balm  1 application Topical BID  . metoprolol tartrate  50 mg Oral BID  . nitroGLYCERIN  1 inch Topical Q6H  . ondansetron (ZOFRAN) IV  4 mg Intravenous Q6H  . pantoprazole  40 mg Oral Q1200  . polyethylene glycol  17 g Oral Daily  . psyllium  1 packet Oral BID  . sodium chloride  10 mL Intracatheter Q12H  . DISCONTD: cloNIDine  0.1 mg Transdermal Weekly  . DISCONTD: insulin aspart  0-9 Units Subcutaneous Q4H   Continuous Infusions:    . sodium chloride 20 mL/hr at 04/21/11 1714  . DISCONTD: sodium chloride 20 mL/hr (04/21/11 1525)  . DISCONTD: fat emulsion 168 mL (04/20/11 1928)  . DISCONTD: TPN (CLINIMIX) +/- additives 60 mL/hr at 04/20/11 2300   PRN Meds:.acetaminophen, bisacodyl, hydrALAZINE, morphine, sodium chloride  Assessment/Plan: 1-SBO (small bowel obstruction): Resolved. Continue bowel regimen and current diet. 2-NSTEMI: Per cardiology; cath on Monday. Will follow results and further recommendations.  3-Hypertension: A lot better with new medication changes. Continue current regimen. 4-Hyperglycemia:Stable. Continue SSI. 5-protein/calorie malnutrition:  Continue ensure supplementation. 6-DVT prophylaxis: Continue lovenox. 7-Anemia: Chronic and stable, no signs of bleeding.   LOS: 10 days   Monica Rhodes 04/22/2011, 12:11 PM

## 2011-04-22 NOTE — Consult Note (Signed)
  NAMEGUIDA, ASMAN              ACCOUNT NO.:  [id number]  MEDICAL RECORD NO.:  [id number]  LOCATION:  2902                         FACILITY:  [facility]  PHYSICIAN:  Luis Abed, MD, FACCDATE OF BIRTH:  04-19-1928  DATE OF CONSULTATION:  04/14/2011 DATE OF DISCHARGE:                                CONSULTATION   PRIMARY CARDIOLOGIST:  New to [facility] being seen by Dr. Myrtis Ser.  PRIMARY CARE PROVIDER:  Renaye Rakers, MD.  PATIENT PROFILE:  An 77 year old female without prior cardiac history though with multiple risk factors, who presented with small-bowel obstruction and is now found to have a type 2 non ST elevation MI.  PROBLEMS: 1. Non ST-segment elevation myocardial infarction.     a.     December 05, 2005, 2D echocardiogram, EF 55-60% with small      pericardial effusion. 2. Hypertension. 3. Hyperlipidemia. 4. Diabetes mellitus. 5. Ongoing tobacco abuse. 6. History of colon and rectal cancer.     a.     Status post partial colectomy in 1997. 7. COPD. 8. Carotid stenosis/peripheral vascular disease.     a.     Carotid ultrasound December 27, 2001 showing subtotal or total      occlusion of the left internal carotid artery.  There is no      evidence of significant stenosis involving the right internal      carotid artery.  There was asymmetry in the blood pressure of the      upper extremities suggesting left subclavian artery stenosis.     Nicolasa Ducking, ANP   ______________________________ Luis Abed, MD, [facility]    CB/MEDQ  D:  04/14/2011  T:  04/14/2011  Job:  [id number]  Electronically Signed by Nicolasa Ducking ANP on 04/15/2011 02:09:38 PM Electronically Signed by Willa Rough MD FACC on 04/22/2011 06:33:39 AM

## 2011-04-23 LAB — GLUCOSE, CAPILLARY
Glucose-Capillary: 106 mg/dL — ABNORMAL HIGH (ref 70–99)
Glucose-Capillary: 145 mg/dL — ABNORMAL HIGH (ref 70–99)

## 2011-04-23 MED ORDER — SODIUM CHLORIDE 0.9 % IV SOLN
1.0000 mL/kg/h | INTRAVENOUS | Status: DC
  Administered 2011-04-24: 1 mL/kg/h via INTRAVENOUS

## 2011-04-23 MED ORDER — SODIUM CHLORIDE 0.9 % IJ SOLN: 3.0000 mL | INTRAMUSCULAR | Status: DC | PRN

## 2011-04-23 MED ORDER — HEART ATTACK BOUNCING BOOK
Freq: Once | Status: AC
  Administered 2011-04-23: 08:00:00
  Filled 2011-04-23: qty 1

## 2011-04-23 MED ORDER — HEART ATTACK BOUNCING BOOK
Freq: Once | Status: DC
  Filled 2011-04-23: qty 1

## 2011-04-23 MED ORDER — SODIUM CHLORIDE 0.9 % IJ SOLN: 3.0000 mL | Freq: Two times a day (BID) | INTRAMUSCULAR | Status: DC

## 2011-04-23 MED ORDER — DIAZEPAM 2 MG PO TABS: 2.0000 mg | ORAL_TABLET | ORAL | Status: AC

## 2011-04-23 MED ORDER — SODIUM CHLORIDE 0.9 % IV SOLN: 250.0000 mL | INTRAVENOUS | Status: DC

## 2011-04-23 NOTE — Progress Notes (Signed)
Subjective: Feeling great. No abdominal pain, no N/V and tolerating PO intake. Good BM's through colostomy. No chest pain.  Objective: Vital signs in last 24 hours: Temp:  [97.5 F (36.4 C)-98.2 F (36.8 C)] 98.2 F (36.8 C) (11/11 0600) Pulse Rate:  [55-68] 66  (11/11 0933) Resp:  [16-20] 18  (11/11 0600) BP: (129-170)/(63-76) 163/74 mmHg (11/11 0933) SpO2:  [95 %-96 %] 95 % (11/11 0600) Weight change:  Last BM Date: 04/23/11  Intake/Output from previous day: 11/10 0701 - 11/11 0700 In: -  Out: 2200 [Urine:2200]     Physical Exam: General: Alert, awake, oriented x3, in no acute distress. HEENT: No bruits, no goiter. Heart: Regular rate and rhythm, + sytsolic murmurs, rubs, gallops. Lungs: Clear to auscultation bilaterally. Abdomen: Soft, nontender, nondistended, positive bowel sounds; colostomy bag with some stool inside. Extremities: No clubbing cyanosis or edema with positive pedal pulses. Neuro: Grossly intact, nonfocal.  Lab Results: Basic Metabolic Panel:  Basename 04/22/11 0510  NA 138  K 4.5  CL 106  CO2 27  GLUCOSE 118*  BUN 30*  CREATININE 0.97  CALCIUM 8.8  MG 1.7  PHOS --   Liver Function Tests: No results found for this basename: AST:2,ALT:2,ALKPHOS:2,BILITOT:2,PROT:2,ALBUMIN:2 in the last 72 hours CBC:  Basename 04/22/11 0510  WBC 7.7  NEUTROABS --  HGB 9.6*  HCT 29.2*  MCV 89.0  PLT 154   CBG:  Basename 04/23/11 0748 04/22/11 2122 04/22/11 1659 04/22/11 1129 04/22/11 0741 04/22/11 0430  GLUCAP 106* 125* 162* 157* 109* 122*   Misc. Labs:  Recent Results (from the past 240 hour(s))  MRSA PCR SCREENING     Status: Normal   Collection Time   04/14/11  9:43 AM      Component Value Range Status Comment   MRSA by PCR NEGATIVE  NEGATIVE  Final     Studies/Results: No results found.  Medications: Scheduled Meds:    . aspirin  324 mg Oral Daily  . cloNIDine  0.2 mg Oral BID  . enalapril  40 mg Oral Daily  . enoxaparin (LOVENOX)  injection  30 mg Subcutaneous 1 day or 1 dose  . feeding supplement  237 mL Oral Daily  . Flora-Q  1 capsule Oral Daily  . heart attack bouncing book   Does not apply Once  . insulin aspart  0-5 Units Subcutaneous QHS  . insulin aspart  0-9 Units Subcutaneous TID WC  . lip balm  1 application Topical BID  . metoprolol tartrate  50 mg Oral BID  . nitroGLYCERIN  1 inch Topical Q6H  . pantoprazole  40 mg Oral Q1200  . polyethylene glycol  17 g Oral Daily  . psyllium  1 packet Oral BID  . sodium chloride  10 mL Intracatheter Q12H  . DISCONTD: feeding supplement  237 mL Oral BID  . DISCONTD: heart attack bouncing book   Does not apply Once  . DISCONTD: ondansetron (ZOFRAN) IV  4 mg Intravenous Q6H   Continuous Infusions:    . sodium chloride 20 mL/hr at 04/22/11 1820   PRN Meds:.acetaminophen, bisacodyl, hydrALAZINE, morphine, ondansetron (ZOFRAN) IV, sodium chloride  Assessment/Plan: 1-SBO (small bowel obstruction): Resolved. 2-NSTEMI: Per cardiology; cath on tomorrow Monday 04/24/11. Will follow results and further recommendations.  3-Hypertension: much better continue current regimen. 4-Hyperglycemia:A1C 7.0 and CBG's now in the 120-160's range. Continue SSI for now and resume modified carb diet as an outpatient. 5-protein/calorie malnutrition: Continue ensure supplementation for 2 more days. Patient now eating better and tolerating  diet ok. 6-DVT prophylaxis: Continue lovenox. 7-Anemia: Chronic and stable, no signs of bleeding. Will check CBC in am.   LOS: 11 days   Monica Rhodes 04/23/2011, 10:15 AM

## 2011-04-23 NOTE — Plan of Care (Signed)
Problem: Discharge Progression Outcomes Goal: Discharge plan in place and appropriate Outcome: Completed/Met Date Met:  04/23/11 Pt from home with son who cares for her---plan to return home with son

## 2011-04-23 NOTE — Progress Notes (Signed)
SUBJECTIVE:    Patient looks good again today on rounds. It appears that she is having a good GI function. She's not having any chest pain.   Filed Vitals:   04/22/11 2200 04/22/11 2300 04/23/11 0600 04/23/11 0933  BP: 170/73 130/63 159/69 163/74  Pulse: 68  65 66  Temp: 98.1 F (36.7 C)  98.2 F (36.8 C)   TempSrc: Oral  Oral   Resp: 20  18   Height:      Weight:      SpO2: 96%  95%     Intake/Output Summary (Last 24 hours) at 04/23/11 0942 Last data filed at 04/23/11 0600  Gross per 24 hour  Intake      0 ml  Output   2200 ml  Net  -2200 ml    LABS: Basic Metabolic Panel:  Basename 04/22/11 0510  NA 138  K 4.5  CL 106  CO2 27  GLUCOSE 118*  BUN 30*  CREATININE 0.97  CALCIUM 8.8  MG 1.7  PHOS --   Liver Function Tests: No results found for this basename: AST:2,ALT:2,ALKPHOS:2,BILITOT:2,PROT:2,ALBUMIN:2 in the last 72 hours No results found for this basename: LIPASE:2,AMYLASE:2 in the last 72 hours CBC:  Basename 04/22/11 0510  WBC 7.7  NEUTROABS --  HGB 9.6*  HCT 29.2*  MCV 89.0  PLT 154   Cardiac Enzymes: No results found for this basename: CKTOTAL:3,CKMB:3,CKMBINDEX:3,TROPONINI:3 in the last 72 hours BNP: No results found for this basename: POCBNP:3 in the last 72 hours D-Dimer: No results found for this basename: DDIMER:2 in the last 72 hours Hemoglobin A1C: No results found for this basename: HGBA1C in the last 72 hours Fasting Lipid Panel: No results found for this basename: CHOL,HDL,LDLCALC,TRIG,CHOLHDL,LDLDIRECT in the last 72 hours Thyroid Function Tests: No results found for this basename: TSH,T4TOTAL,FREET3,T3FREE,THYROIDAB in the last 72 hours  RADIOLOGY: Dg Chest 2 View  04/17/2011  *RADIOLOGY REPORT*  Clinical Data: Chest pain, short of breath, nausea and vomiting  CHEST - 2 VIEW  Comparison: Portable chest x-ray of 04/14/2011  Findings: The lungs are hyperaerated.  There is cardiomegaly present with mild pulmonary vascular  congestion and small effusions most consistent with mild congestive heart failure.  The bones are diffusely osteopenic.  An NG tube extends below the hemidiaphragm.  IMPRESSION: COPD.  Probable mild CHF with small effusions.  Original Report Authenticated By: Juline Patch, M.D.   PHYSICAL EXAM      Patient is oriented to person time and place. Affect is normal. Lungs are clear. Respiratory effort is unlabored. Cardiac exam reveals S1-S2. There are no clicks or significant murmurs. There is no peripheral edema.   TELEMETRY: Telemetry is reviewed. She has sinus rhythm.   ASSESSMENT AND PLAN:  Principal Problem:  *SBO (small bowel obstruction)       The patient appears to be clinically stable so that we can proceed now with cardiac workup. Active Problems:  Acute myocardial infarction, subendocardial infarction, subsequent episode of care       The patient had enzyme elevation with diffuse ST changes when she was very sick initially on presentation to the hospital. She had some right arm pain. Diagnostic catheter will be done tomorrow. It will be very important that we carefully reviewed the films and put all of the data in context. The patient is elderly and has significant GI disease. She may well have recurrent bowel obstructions. We need to assess the risk of her anatomy before proceeding with any possible intervention that might  require dual antiplatelet therapy.  Hypertension  UTI (urinary tract infection)  Protein calorie malnutrition   Willa Rough 04/23/2011 9:42 AM

## 2011-04-24 ENCOUNTER — Encounter (HOSPITAL_COMMUNITY)
Admission: EM | Disposition: A | Discharge status: Home / Self Care | Payer: Self-pay | Source: Home / Self Care | Attending: Internal Medicine

## 2011-04-24 HISTORY — PX: LEFT HEART CATHETERIZATION WITH CORONARY ANGIOGRAM: SHX5451

## 2011-04-24 LAB — BASIC METABOLIC PANEL
BUN: 29 mg/dL — ABNORMAL HIGH (ref 6–23)
CO2: 26 mEq/L (ref 19–32)
Chloride: 109 mEq/L (ref 96–112)
Creatinine, Ser: 0.94 mg/dL (ref 0.50–1.10)
Glucose, Bld: 117 mg/dL — ABNORMAL HIGH (ref 70–99)

## 2011-04-24 LAB — CBC
HCT: 27.4 % — ABNORMAL LOW (ref 36.0–46.0)
Hemoglobin: 9 g/dL — ABNORMAL LOW (ref 12.0–15.0)
MCH: 29.1 pg (ref 26.0–34.0)
MCHC: 32.8 g/dL (ref 30.0–36.0)
MCV: 88.7 fL (ref 78.0–100.0)

## 2011-04-24 LAB — GLUCOSE, CAPILLARY
Glucose-Capillary: 99 mg/dL (ref 70–99)
Glucose-Capillary: 186 mg/dL — ABNORMAL HIGH (ref 70–99)

## 2011-04-24 SURGERY — LEFT HEART CATHETERIZATION WITH CORONARY ANGIOGRAM
Anesthesia: LOCAL

## 2011-04-24 MED ORDER — OXYCODONE-ACETAMINOPHEN 5-325 MG PO TABS: 1.0000 | ORAL_TABLET | ORAL | Status: DC | PRN

## 2011-04-24 MED ORDER — HEPARIN (PORCINE) IN NACL 2-0.9 UNIT/ML-% IJ SOLN
INTRAMUSCULAR | Status: AC
  Filled 2011-04-24: qty 2000

## 2011-04-24 MED ORDER — LIDOCAINE HCL (PF) 1 % IJ SOLN
INTRAMUSCULAR | Status: AC
  Filled 2011-04-24: qty 30

## 2011-04-24 MED ORDER — SODIUM CHLORIDE 0.45 % IV SOLN: INTRAVENOUS | Status: AC

## 2011-04-24 MED ORDER — MIDAZOLAM HCL 2 MG/2ML IJ SOLN
INTRAMUSCULAR | Status: AC
  Filled 2011-04-24: qty 2

## 2011-04-24 MED ORDER — NITROGLYCERIN 0.2 MG/ML ON CALL CATH LAB
INTRAVENOUS | Status: AC
  Filled 2011-04-24: qty 1

## 2011-04-24 MED ORDER — METOPROLOL TARTRATE 50 MG PO TABS
50.0000 mg | ORAL_TABLET | Freq: Two times a day (BID) | ORAL | Status: DC
  Administered 2011-04-25 – 2011-04-27 (×3): 50 mg via ORAL
  Filled 2011-04-24 (×7): qty 1

## 2011-04-24 MED ORDER — METOPROLOL TARTRATE 50 MG PO TABS: 50.0000 mg | ORAL_TABLET | Freq: Two times a day (BID) | ORAL | Status: DC

## 2011-04-24 MED ORDER — HEPARIN SODIUM (PORCINE) 1000 UNIT/ML IJ SOLN
INTRAMUSCULAR | Status: AC
  Filled 2011-04-24: qty 1

## 2011-04-24 MED ORDER — VERAPAMIL HCL 2.5 MG/ML IV SOLN
INTRAVENOUS | Status: AC
  Filled 2011-04-24: qty 2

## 2011-04-24 NOTE — Progress Notes (Addendum)
Nutrition Follow-Up Note  Pt NPO at this time for cardiac cath. Noted small bowel obstruction resolved. No abdominal pain, no N/V. NGT and TPN discontinued. Previously on a Heart Healthy diet with intake of 40-100% per records. Ensure Clinical Strength supplement ordered daily.  I/O last 3 completed shifts: In: 600 [P.O.:600] Out: 3600 [Urine:3600] Total I/O In: 280 [P.O.:280] Out: -   Labs: Sodium 142 mEq/L      Potassium 4.7 mEq/L      Chloride 109 mEq/L      CO2 26 mEq/L      Glucose, Bld 117 mg/dL H     BUN 29 mg/dL H     Creatinine, Ser 1.61 mg/dL      Calcium 9.0 mg/dL  Nutrition Dx: Inadequate Oral Intake, improved. Goal: pt to meet 90-100% of estimated nutrition needs, unmet.  Plan:  Continue Ensure Clinical Strength supplement daily  RD to follow for nutrition care plan  Kirkland Hun, RD, LDN Pager # [phone]

## 2011-04-24 NOTE — Procedures (Signed)
Catheterization  Indication: SEMI  The patient has multiple comorbidities.  She was referred for diagnostic catheterization for presumed SEMI.  I examined the patient on the floor prior to the case and she had very poor pulses wit calcific vessels.  Despite multiple wires and a smart needle we were unable to access the common femoral arteries on either side.  I had Dr's Brent Bulla and Dr Darrick Penna attempt as well.  Dr. Darrick Penna attempted US guidance and noted marked collaterals in the right iliac region with reflux into the common femoral suggesting total occlusion of the distal femoral or iliac on the right side.  Similar wholey wire position on the left side suggested occlusion on that side as well.  Dr. Shirlee Latch and I were also unable to acces the right radial artery being unable to pass a wire cleanly past 5cm of length.     Impression:  Will discuss with primary cardiologist Dr Myrtis Ser.  I suspect the best approach with this patient given her poor functional status and comorbidities would be medical Rx.  If she were to have an acute coronary syndrome with ST elevation the only access possibel to try would be the left radial.  We were reluctant to try this given the length of time we spent trying to get access and the patients anemia and frailty.  [name] 04/24/2011 3:00 PM

## 2011-04-24 NOTE — H&P (View-Only) (Signed)
  SUBJECTIVE:      Patient is stable during the night. Orders have been written. She's for catheter today. See my complete note yesterday.   Filed Vitals:   04/23/11 1900 04/23/11 2000 04/24/11 0005 04/24/11 0500  BP:  138/72 124/62 134/76  Pulse:  65 55 51  Temp:  98 F (36.7 C) 98.4 F (36.9 C) 98.2 F (36.8 C)  TempSrc:      Resp:  20 18 18   Height:      Weight: 136 lb 4.8 oz (61.825 kg)   135 lb 3.2 oz (61.326 kg)  SpO2:  94% 95% 93%    Intake/Output Summary (Last 24 hours) at 04/24/11 0908 Last data filed at 04/24/11 0300  Gross per 24 hour  Intake    240 ml  Output   1400 ml  Net  -1160 ml    LABS: Basic Metabolic Panel:  Basename 04/24/11 0500 04/22/11 0510  NA 142 138  K 4.7 4.5  CL 109 106  CO2 26 27  GLUCOSE 117* 118*  BUN 29* 30*  CREATININE 0.94 0.97  CALCIUM 9.0 8.8  MG -- 1.7  PHOS -- --   Liver Function Tests: No results found for this basename: AST:2,ALT:2,ALKPHOS:2,BILITOT:2,PROT:2,ALBUMIN:2 in the last 72 hours No results found for this basename: LIPASE:2,AMYLASE:2 in the last 72 hours CBC:  Basename 04/24/11 0500 04/22/11 0510  WBC 7.4 7.7  NEUTROABS -- --  HGB 9.0* 9.6*  HCT 27.4* 29.2*  MCV 88.7 89.0  PLT 179 154   Cardiac Enzymes: No results found for this basename: CKTOTAL:3,CKMB:3,CKMBINDEX:3,TROPONINI:3 in the last 72 hours BNP: No results found for this basename: POCBNP:3 in the last 72 hours D-Dimer: No results found for this basename: DDIMER:2 in the last 72 hours Hemoglobin A1C: No results found for this basename: HGBA1C in the last 72 hours Fasting Lipid Panel: No results found for this basename: CHOL,HDL,LDLCALC,TRIG,CHOLHDL,LDLDIRECT in the last 72 hours Thyroid Function Tests: No results found for this basename: TSH,T4TOTAL,FREET3,T3FREE,THYROIDAB in the last 72 hours  RADIOLOGY: Dg Chest 2 View  04/17/2011  *RADIOLOGY REPORT*  Clinical Data: Chest pain, short of breath, nausea and vomiting  CHEST - 2 VIEW   Comparison: Portable chest x-ray of 04/14/2011  Findings: The lungs are hyperaerated.  There is cardiomegaly present with mild pulmonary vascular congestion and small effusions most consistent with mild congestive heart failure.  The bones are diffusely osteopenic.  An NG tube extends below the hemidiaphragm.  IMPRESSION: COPD.  Probable mild CHF with small effusions.  Original Report Authenticated By: Juline Patch, M.D.     PHYSICAL EXAM    TELEMETRY:    ASSESSMENT AND PLAN:  Principal Problem:  *SBO (small bowel obstruction) Active Problems:   Acute myocardial infarction, subendocardial infarction, subsequent episode of care      Cardiac catheter will be done this afternoon. Plans will be to assess her anatomy and then rediscuss very carefully the approach relative to all of her medical problems.      Hypertension  UTI (urinary tract infection)  Protein calorie malnutrition   Willa Rough 04/24/2011 9:08 AM

## 2011-04-24 NOTE — Interval H&P Note (Signed)
History and Physical Interval Note:   04/24/2011   2:58 PM   Monica Rhodes  has presented today for surgery, with the diagnosis of chest pain  The various methods of treatment have been discussed with the patient and family. After consideration of risks, benefits and other options for treatment, the patient has consented to  Procedure(s): LEFT HEART CATHETERIZATION WITH CORONARY ANGIOGRAM as a surgical intervention .  The patients' history has been reviewed, patient examined, no change in status, stable for surgery.  I have reviewed the patients' chart and labs.  Questions were answered to the patient's satisfaction.     [name]  MD 2:59 PM 04/24/11

## 2011-04-24 NOTE — Progress Notes (Signed)
Subjective: Feeling great. No chest pain.  Objective: Vital signs in last 24 hours: Temp:  [98 F (36.7 C)-98.6 F (37 C)] 98.2 F (36.8 C) (11/12 0500) Pulse Rate:  [51-66] 51  (11/12 0500) Resp:  [18-20] 18  (11/12 0500) BP: (124-163)/(62-76) 134/76 mmHg (11/12 0500) SpO2:  [93 %-97 %] 93 % (11/12 0500) Weight:  [61.326 kg (135 lb 3.2 oz)-61.825 kg (136 lb 4.8 oz)] 135 lb 3.2 oz (61.326 kg) (11/12 0500) Weight change:  Last BM Date: 04/23/11  Intake/Output from previous day: 11/11 0701 - 11/12 0700 In: 600 [P.O.:600] Out: 1400 [Urine:1400]     Physical Exam: General: Alert, awake, oriented x3, in no acute distress. HEENT: No bruits, no goiter. Heart: Regular rate and rhythm, + sytsolic murmurs, rubs, gallops. Lungs: Clear to auscultation bilaterally. Abdomen: Soft, nontender, nondistended, positive bowel sounds; colostomy bag with some stool inside. Extremities: No clubbing cyanosis or edema with positive pedal pulses. Neuro: Grossly intact, nonfocal.  Lab Results: Basic Metabolic Panel:  Basename 04/24/11 0500 04/22/11 0510  NA 142 138  K 4.7 4.5  CL 109 106  CO2 26 27  GLUCOSE 117* 118*  BUN 29* 30*  CREATININE 0.94 0.97  CALCIUM 9.0 8.8  MG -- 1.7  PHOS -- --   Liver Function Tests: No results found for this basename: AST:2,ALT:2,ALKPHOS:2,BILITOT:2,PROT:2,ALBUMIN:2 in the last 72 hours CBC:  Basename 04/24/11 0500 04/22/11 0510  WBC 7.4 7.7  NEUTROABS -- --  HGB 9.0* 9.6*  HCT 27.4* 29.2*  MCV 88.7 89.0  PLT 179 154   CBG:  Basename 04/24/11 0810 04/23/11 2125 04/23/11 1644 04/23/11 1201 04/23/11 0748 04/22/11 2122  GLUCAP 109* 145* 117* 135* 106* 125*   Misc. Labs:  Recent Results (from the past 240 hour(s))  MRSA PCR SCREENING     Status: Normal   Collection Time   04/14/11  9:43 AM      Component Value Range Status Comment   MRSA by PCR NEGATIVE  NEGATIVE  Final     Studies/Results: No results found.  Medications: Scheduled  Meds:    . aspirin  324 mg Oral Daily  . cloNIDine  0.2 mg Oral BID  . diazepam  2 mg Oral On Call  . enalapril  40 mg Oral Daily  . enoxaparin (LOVENOX) injection  30 mg Subcutaneous 1 day or 1 dose  . feeding supplement  237 mL Oral Daily  . Flora-Q  1 capsule Oral Daily  . insulin aspart  0-5 Units Subcutaneous QHS  . insulin aspart  0-9 Units Subcutaneous TID WC  . lip balm  1 application Topical BID  . metoprolol tartrate  50 mg Oral BID  . nitroGLYCERIN  1 inch Topical Q6H  . pantoprazole  40 mg Oral Q1200  . polyethylene glycol  17 g Oral Daily  . psyllium  1 packet Oral BID  . sodium chloride  10 mL Intracatheter Q12H  . DISCONTD: sodium chloride  3 mL Intravenous Q12H   Continuous Infusions:    . sodium chloride 20 mL/hr (04/23/11 2103)  . sodium chloride 1 mL/kg/hr (04/24/11 5409)  . DISCONTD: sodium chloride     PRN Meds:.acetaminophen, bisacodyl, hydrALAZINE, morphine, ondansetron (ZOFRAN) IV, sodium chloride, DISCONTD: sodium chloride  Assessment/Plan: 1-SBO (small bowel obstruction): Resolved. 2-NSTEMI: cath today; will follow results from cath and cardiology recommendations.  3-Hypertension: Well controlled now; continue current regimen. 4-Hyperglycemia:Continue SSI. 5-protein/calorie malnutrition: Continue ensure supplementation for 2 more days. Patient now eating better and tolerating diet ok. 6-DVT  prophylaxis: Continue lovenox. 7-Anemia: Chronic and stable, no signs of bleeding. Hgb 9.0   LOS: 12 days   Merrell Borsuk 04/24/2011, 8:32 AM

## 2011-04-24 NOTE — Progress Notes (Signed)
  SUBJECTIVE:      Patient is stable during the night. Orders have been written. She's for catheter today. See my complete note yesterday.   Filed Vitals:   04/23/11 1900 04/23/11 2000 04/24/11 0005 04/24/11 0500  BP:  138/72 124/62 134/76  Pulse:  65 55 51  Temp:  98 F (36.7 C) 98.4 F (36.9 C) 98.2 F (36.8 C)  TempSrc:      Resp:  20 18 18  Height:      Weight: 136 lb 4.8 oz (61.825 kg)   135 lb 3.2 oz (61.326 kg)  SpO2:  94% 95% 93%    Intake/Output Summary (Last 24 hours) at 04/24/11 0908 Last data filed at 04/24/11 0300  Gross per 24 hour  Intake    240 ml  Output   1400 ml  Net  -1160 ml    LABS: Basic Metabolic Panel:  Basename 04/24/11 0500 04/22/11 0510  NA 142 138  K 4.7 4.5  CL 109 106  CO2 26 27  GLUCOSE 117* 118*  BUN 29* 30*  CREATININE 0.94 0.97  CALCIUM 9.0 8.8  MG -- 1.7  PHOS -- --   Liver Function Tests: No results found for this basename: AST:2,ALT:2,ALKPHOS:2,BILITOT:2,PROT:2,ALBUMIN:2 in the last 72 hours No results found for this basename: LIPASE:2,AMYLASE:2 in the last 72 hours CBC:  Basename 04/24/11 0500 04/22/11 0510  WBC 7.4 7.7  NEUTROABS -- --  HGB 9.0* 9.6*  HCT 27.4* 29.2*  MCV 88.7 89.0  PLT 179 154   Cardiac Enzymes: No results found for this basename: CKTOTAL:3,CKMB:3,CKMBINDEX:3,TROPONINI:3 in the last 72 hours BNP: No results found for this basename: POCBNP:3 in the last 72 hours D-Dimer: No results found for this basename: DDIMER:2 in the last 72 hours Hemoglobin A1C: No results found for this basename: HGBA1C in the last 72 hours Fasting Lipid Panel: No results found for this basename: CHOL,HDL,LDLCALC,TRIG,CHOLHDL,LDLDIRECT in the last 72 hours Thyroid Function Tests: No results found for this basename: TSH,T4TOTAL,FREET3,T3FREE,THYROIDAB in the last 72 hours  RADIOLOGY: Dg Chest 2 View  04/17/2011  *RADIOLOGY REPORT*  Clinical Data: Chest pain, short of breath, nausea and vomiting  CHEST - 2 VIEW   Comparison: Portable chest x-ray of 04/14/2011  Findings: The lungs are hyperaerated.  There is cardiomegaly present with mild pulmonary vascular congestion and small effusions most consistent with mild congestive heart failure.  The bones are diffusely osteopenic.  An NG tube extends below the hemidiaphragm.  IMPRESSION: COPD.  Probable mild CHF with small effusions.  Original Report Authenticated By: PAUL D. BARRY, M.D.     PHYSICAL EXAM    TELEMETRY:    ASSESSMENT AND PLAN:  Principal Problem:  *SBO (small bowel obstruction) Active Problems:   Acute myocardial infarction, subendocardial infarction, subsequent episode of care      Cardiac catheter will be done this afternoon. Plans will be to assess her anatomy and then rediscuss very carefully the approach relative to all of her medical problems.      Hypertension  UTI (urinary tract infection)  Protein calorie malnutrition   Monica Rhodes 04/24/2011 9:08 AM  

## 2011-04-25 ENCOUNTER — Encounter (HOSPITAL_COMMUNITY): Payer: Self-pay

## 2011-04-25 ENCOUNTER — Encounter (HOSPITAL_COMMUNITY): Payer: Self-pay | Admitting: *Deleted

## 2011-04-25 LAB — CBC
HCT: 26.2 % — ABNORMAL LOW (ref 36.0–46.0)
MCV: 89.7 fL (ref 78.0–100.0)
Platelets: 189 10*3/uL (ref 150–400)
RBC: 2.92 MIL/uL — ABNORMAL LOW (ref 3.87–5.11)
WBC: 8.2 10*3/uL (ref 4.0–10.5)

## 2011-04-25 LAB — BASIC METABOLIC PANEL
BUN: 26 mg/dL — ABNORMAL HIGH (ref 6–23)
GFR calc non Af Amer: 58 mL/min — ABNORMAL LOW (ref >90)
Glucose, Bld: 127 mg/dL — ABNORMAL HIGH (ref 70–99)
Potassium: 4.2 mEq/L (ref 3.5–5.1)

## 2011-04-25 LAB — GLUCOSE, CAPILLARY
Glucose-Capillary: 114 mg/dL — ABNORMAL HIGH (ref 70–99)
Glucose-Capillary: 158 mg/dL — ABNORMAL HIGH (ref 70–99)
Glucose-Capillary: 83 mg/dL (ref 70–99)
Glucose-Capillary: 158 mg/dL — ABNORMAL HIGH (ref 70–99)

## 2011-04-25 LAB — ABO/RH: ABO/RH(D): O POS

## 2011-04-25 MED ORDER — FUROSEMIDE 10 MG/ML IJ SOLN
20.0000 mg | Freq: Once | INTRAMUSCULAR | Status: AC
  Administered 2011-04-25: 20 mg via INTRAVENOUS

## 2011-04-25 MED ORDER — FUROSEMIDE 10 MG/ML IJ SOLN
INTRAMUSCULAR | Status: AC
  Administered 2011-04-25: 20 mg via INTRAVENOUS
  Filled 2011-04-25: qty 4

## 2011-04-25 NOTE — Progress Notes (Signed)
Spoke w pt, she states she has 5 children who all live right around her. She does not feel she needs any hhc at this time.

## 2011-04-25 NOTE — Progress Notes (Signed)
Subjective: Feeling great. No chest pain. Having some pain and bruises on her right wrist/forearm from yesterday attempt to get access for cath.  Objective: Vital signs in last 24 hours: Temp:  [98 F (36.7 C)-98.6 F (37 C)] 98 F (36.7 C) (11/13 0500) Pulse Rate:  [51-64] 64  (11/13 1006) Resp:  [18-20] 18  (11/13 0500) BP: (140-162)/(49-86) 149/76 mmHg (11/13 1006) SpO2:  [93 %-96 %] 93 % (11/13 0500) Weight:  [59.376 kg (130 lb 14.4 oz)] 130 lb 14.4 oz (59.376 kg) (11/13 0500) Weight change: -2.449 kg (-5 lb 6.4 oz) Last BM Date: 04/25/11  Intake/Output from previous day: 11/12 0701 - 11/13 0700 In: 280 [P.O.:280] Out: 626 [Urine:625; Stool:1] Total I/O In: -  Out: 1 [Stool:1]   Physical Exam: General: Alert, awake, oriented x3, in no acute distress. HEENT: No bruits, no goiter. Heart: Regular rate and rhythm, + sytsolic murmurs, rubs, gallops. Lungs: Clear to auscultation bilaterally. Abdomen: Soft, nontender, nondistended, positive bowel sounds; colostomy bag with some stool inside. Extremities: No clubbing cyanosis or edema with positive pedal pulses. Neuro: Grossly intact, nonfocal.  Lab Results: Basic Metabolic Panel:  Basename 04/25/11 0505 04/24/11 0500  NA 141 142  K 4.2 4.7  CL 109 109  CO2 26 26  GLUCOSE 127* 117*  BUN 26* 29*  CREATININE 0.90 0.94  CALCIUM 8.6 9.0  MG -- --  PHOS -- --   CBC:  Basename 04/25/11 0505 04/24/11 0500  WBC 8.2 7.4  NEUTROABS -- --  HGB 8.4* 9.0*  HCT 26.2* 27.4*  MCV 89.7 88.7  PLT 189 179   CBG:  Basename 04/25/11 1206 04/25/11 0747 04/24/11 2111 04/24/11 1726 04/24/11 1640 04/24/11 1226  GLUCAP 158* 114* 186* 82 87 99   Misc. Labs:  No results found for this or any previous visit (from the past 240 hour(s)).  Studies/Results: No results found.  Medications: Scheduled Meds:    . aspirin  324 mg Oral Daily  . cloNIDine  0.2 mg Oral BID  . diazepam  2 mg Oral On Call  . enalapril  40 mg Oral  Daily  . Flora-Q  1 capsule Oral Daily  . furosemide  20 mg Intravenous Once  . heparin      . heparin      . insulin aspart  0-5 Units Subcutaneous QHS  . insulin aspart  0-9 Units Subcutaneous TID WC  . lip balm  1 application Topical BID  . metoprolol tartrate  50 mg Oral BID  . midazolam      . nitroGLYCERIN  1 inch Topical Q6H  . nitroGLYCERIN      . pantoprazole  40 mg Oral Q1200  . polyethylene glycol  17 g Oral Daily  . psyllium  1 packet Oral BID  . sodium chloride  10 mL Intracatheter Q12H  . verapamil      . DISCONTD: enoxaparin (LOVENOX) injection  30 mg Subcutaneous 1 day or 1 dose  . DISCONTD: feeding supplement  237 mL Oral Daily  . DISCONTD: metoprolol tartrate  50 mg Oral BID  . DISCONTD: metoprolol tartrate  50 mg Oral BID   Continuous Infusions:    . sodium chloride    . DISCONTD: sodium chloride 20 mL/hr (04/23/11 2103)  . DISCONTD: sodium chloride 1 mL/kg/hr (04/24/11 1610)   PRN Meds:.acetaminophen, bisacodyl, hydrALAZINE, morphine, ondansetron (ZOFRAN) IV, oxyCODONE-acetaminophen, sodium chloride  Assessment/Plan: 1-SBO (small bowel obstruction): Resolved. 2-NSTEMI: unable to have cath due to lack of access; at this  point per cardiology patient doing ok and w/o CP; will provide just medical management and will follow with Dr. Myrtis Ser in 4 weeks for further assessment and medication adjustment.  3-Hypertension: Stable with new regimen. Continue current meds. 4-Hyperglycemia: Continue SSI while in the hospital, will d/C ensure (which is contributing to higher CBG levels). 5-protein/calorie malnutrition: Now eating ok and tolerating diet; will D/C ensure. 6-DVT prophylaxis: Continue lovenox. 7-Anemia:Hgb today is 8.4; due to recent ischemic events will transfuse 1 unit in order to try to keep Hgb as close as possible to 10. Will give lasix after transfusion and follow Hgb in am.    LOS: 13 days   Monica Rhodes 04/25/2011, 1:54 PM

## 2011-04-25 NOTE — Progress Notes (Signed)
@ Subjective:  Denies SSCP, palpitations or Dyspnea Right wrist a little sore  Objective:  Vital Signs in the last 24 hours:       Wt Readings from Last 1 Encounters:  04/25/11 59.376 kg (130 lb 14.4 oz)    Temp:  [98 F (36.7 C)-98.6 F (37 C)] 98 F (36.7 C) (11/13 0500) Pulse Rate:  [51-61] 58  (11/13 0500) Resp:  [18-20] 18  (11/13 0500) BP: (140-162)/(49-86) 145/60 mmHg (11/13 0500) SpO2:  [93 %-96 %] 93 % (11/13 0500) Weight:  [59.376 kg (130 lb 14.4 oz)] 130 lb 14.4 oz (59.376 kg) (11/13 0500)  Intake/Output from previous day: 11/12 0701 - 11/13 0700 In: 280 [P.O.:280] Out: 625 [Urine:625] Intake/Output from this shift:    Physical Exam: General appearance: alert Pale S1/S2 soft SEM Lungs clear Poor distal pulses with mild bruising at femoral sites Echymosis right radial. No edema  Lab Results:  Basename 04/25/11 0505 04/24/11 0500  WBC 8.2 7.4  HGB 8.4* 9.0*  PLT 189 179    Basename 04/25/11 0505 04/24/11 0500  NA 141 142  K 4.2 4.7  CL 109 109  CO2 26 26  GLUCOSE 127* 117*  BUN 26* 29*  CREATININE 0.90 0.94   No results found for this basename: TROPONINI:2,CK,MB:2 in the last 72 hours Hepatic Function Panel No results found for this basename: PROT,ALBUMIN,AST,ALT,ALKPHOS,BILITOT,BILIDIR,IBILI in the last 72 hours No results found for this basename: CHOL in the last 72 hours No results found for this basename: PROTIME in the last 72 hours  Imaging: No results found.  Cardiac Studies:  ECG:  Orders placed during the hospital encounter of 04/12/11  . EKG  . EKG  . EKG     Telemetry:  Echo:   Medications:     . aspirin  324 mg Oral Daily  . cloNIDine  0.2 mg Oral BID  . diazepam  2 mg Oral On Call  . enalapril  40 mg Oral Daily  . feeding supplement  237 mL Oral Daily  . Flora-Q  1 capsule Oral Daily  . heparin      . heparin      . insulin aspart  0-5 Units Subcutaneous QHS  . insulin aspart  0-9 Units Subcutaneous TID WC  .  lip balm  1 application Topical BID  . metoprolol tartrate  50 mg Oral BID  . midazolam      . nitroGLYCERIN  1 inch Topical Q6H  . nitroGLYCERIN      . pantoprazole  40 mg Oral Q1200  . polyethylene glycol  17 g Oral Daily  . psyllium  1 packet Oral BID  . sodium chloride  10 mL Intracatheter Q12H  . verapamil      . DISCONTD: enoxaparin (LOVENOX) injection  30 mg Subcutaneous 1 day or 1 dose  . DISCONTD: metoprolol tartrate  50 mg Oral BID  . DISCONTD: metoprolol tartrate  50 mg Oral BID    Assessment/Plan:   Patient Active Hospital Problem List: SBO (small bowel obstruction) (04/16/2011)   Assessment: Colostomy  Cannot have colonoscopy for about 3 months post MI  I believe one was scheduled 11/27   Plan: Per surgery Acute myocardial infarction, subendocardial infarction, subsequent episode of care (04/14/2011)   Assessment: Unable to get access for cath  Medical Rx  F/U Monica Rhodes in 4-6 weeks   Plan: Medical Rx Hypertension ()   Assessment: Stable3   Plan: Continue current meds Anemia:  With CAD and marked change  since 6 weeks ago Tranfuse one unit.  Target Hb 10   Monica Rhodes 04/25/2011, 8:01 AM

## 2011-04-25 NOTE — Progress Notes (Signed)
Utilization Review Completed.  Ricki Clack, [place]  04/25/2011

## 2011-04-26 LAB — TYPE AND SCREEN

## 2011-04-26 LAB — GLUCOSE, CAPILLARY
Glucose-Capillary: 132 mg/dL — ABNORMAL HIGH (ref 70–99)
Glucose-Capillary: 142 mg/dL — ABNORMAL HIGH (ref 70–99)

## 2011-04-26 LAB — CBC
MCV: 88.8 fL (ref 78.0–100.0)
Platelets: 201 10*3/uL (ref 150–400)
RBC: 3.29 MIL/uL — ABNORMAL LOW (ref 3.87–5.11)
WBC: 7.5 10*3/uL (ref 4.0–10.5)

## 2011-04-26 MED ORDER — HYDRALAZINE HCL 25 MG PO TABS
25.0000 mg | ORAL_TABLET | Freq: Three times a day (TID) | ORAL | Status: DC
  Administered 2011-04-26 – 2011-04-27 (×3): 25 mg via ORAL
  Filled 2011-04-26 (×6): qty 1

## 2011-04-26 NOTE — Progress Notes (Signed)
SUBJECTIVE:  Patient looks good today. All of the sites where cath was attempted are stable.   Filed Vitals:   04/25/11 2002 04/25/11 2129 04/26/11 0100 04/26/11 0511  BP: 174/71 162/70  138/75  Pulse: 55 59  55  Temp: 98.1 F (36.7 C)   98.5 F (36.9 C)  TempSrc: Oral   Oral  Resp: 18   16  Height:      Weight:   135 lb 14.4 oz (61.644 kg) 134 lb 8 oz (61.009 kg)  SpO2:    93%    Intake/Output Summary (Last 24 hours) at 04/26/11 0800 Last data filed at 04/26/11 0252  Gross per 24 hour  Intake 952.08 ml  Output    503 ml  Net 449.08 ml    LABS: Basic Metabolic Panel:  Basename 04/25/11 0505 04/24/11 0500  NA 141 142  K 4.2 4.7  CL 109 109  CO2 26 26  GLUCOSE 127* 117*  BUN 26* 29*  CREATININE 0.90 0.94  CALCIUM 8.6 9.0  MG -- --  PHOS -- --   Liver Function Tests: No results found for this basename: AST:2,ALT:2,ALKPHOS:2,BILITOT:2,PROT:2,ALBUMIN:2 in the last 72 hours No results found for this basename: LIPASE:2,AMYLASE:2 in the last 72 hours CBC:  Basename 04/26/11 0558 04/25/11 0505  WBC 7.5 8.2  NEUTROABS -- --  HGB 9.5* 8.4*  HCT 29.2* 26.2*  MCV 88.8 89.7  PLT 201 189   Cardiac Enzymes: No results found for this basename: CKTOTAL:3,CKMB:3,CKMBINDEX:3,TROPONINI:3 in the last 72 hours BNP: No results found for this basename: POCBNP:3 in the last 72 hours D-Dimer: No results found for this basename: DDIMER:2 in the last 72 hours Hemoglobin A1C: No results found for this basename: HGBA1C in the last 72 hours Fasting Lipid Panel: No results found for this basename: CHOL,HDL,LDLCALC,TRIG,CHOLHDL,LDLDIRECT in the last 72 hours Thyroid Function Tests: No results found for this basename: TSH,T4TOTAL,FREET3,T3FREE,THYROIDAB in the last 72 hours  RADIOLOGY: Dg Chest 2 View  04/17/2011  *RADIOLOGY REPORT*  Clinical Data: Chest pain, short of breath, nausea and vomiting  CHEST - 2 VIEW  Comparison: Portable chest x-ray of 04/14/2011  Findings: The lungs  are hyperaerated.  There is cardiomegaly present with mild pulmonary vascular congestion and small effusions most consistent with mild congestive heart failure.  The bones are diffusely osteopenic.  An NG tube extends below the hemidiaphragm.  IMPRESSION: COPD.  Probable mild CHF with small effusions.  Original Report Authenticated By: Juline Patch, M.D.   Ct Abdomen Pelvis W Contrast  04/13/2011  *RADIOLOGY REPORT*  Clinical Data: Nominal pain and vomiting  CT ABDOMEN AND PELVIS WITH CONTRAST  Technique:  Multidetector CT imaging of the abdomen and pelvis was performed following the standard protocol during bolus administration of intravenous contrast.  Contrast: 80mL OMNIPAQUE IOHEXOL 300 MG/ML IV SOLN  Comparison: None.  Findings: Mild dependent atelectasis in the lung bases.  Small pericardial effusions similar to previous study.  Small upper abdominal effusion around the upper liver and spleen.  This is new since the previous study.  Increased density within the gallbladder consistent with sludge.  No gallbladder wall thickening or stones demonstrated.  The liver, spleen, and pancreas are otherwise unremarkable.  No adrenal gland nodules.  Prominent diffuse calcific atherosclerotic changes throughout the abdominal aorta and branch vessels.  Focal significant narrowing of the distal abdominal aorta just before the bifurcation. Decreased flow in the external iliac arteries consistent with significant stenosis. Renal atrophy and parenchymal cysts.  No hydronephrosis. Retroperitoneal lymph nodes  are not pathologically enlarged.  No free air or free fluid in the abdomen.  The stomach is normal. Lower abdominal and pelvic small bowel loops are distended with air fluid levels.  The terminal ileum is decompressed.  Thickening of the wall of the terminal ileum suggesting stricture, possibly representing inflammatory bowel disease.  Pelvis:  Partial left colectomy with left lower quadrant colostomy. Stool filled  colon without distension or wall thickening.  No free or loculated pelvic fluid collections.  Compressed. The appendix is normal.  Degenerative changes in the lumbar spine with normal alignment.  IMPRESSION:  1.  Decompressed distal ileum with wall thickening suggesting possible inflammatory stricture.  Proximal small bowel obstruction. 2.  Partial left colectomy with left lower quadrant colostomy. 3.  Extensive calcific atherosclerotic changes throughout the abdominal aorta and branch vessels with suggestion of significant stenosis of the distal aorta and iliac bifurcation region.  Poor flow demonstrated in the external iliac arteries. 4.  Small amount of upper abdominal ascites.  Small pericardial effusion. 5.  Gallbladder sludge.  Original Report Authenticated By: Marlon Pel, M.D.   Dg Chest Portable 1 View  04/14/2011  *RADIOLOGY REPORT*  Clinical Data: Shortness of breath.  Hypoxia.  Myocardial infarction.  PORTABLE CHEST - 1 VIEW  Comparison: 12/20/2005  Findings: Considerable bilateral interstitial accentuation is present in both lungs, favoring acute pulmonary edema over atypical pneumonia.  Mild cardiomegaly is present.  Nasogastric tube extends into the stomach.  Thoracic spondylosis noted.  IMPRESSION:  1.  Diffuse prominent interstitial opacity in the lungs favoring interstitial edema over atypical pneumonia. 2.  Mild cardiomegaly.  Original Report Authenticated By: Dellia Cloud, M.D.   Dg Abd 2 Views  04/13/2011  *RADIOLOGY REPORT*  Clinical Data: Follow up small bowel obstruction.  ABDOMEN - 2 VIEW 04/13/2011:  Comparison: CT abdomen and pelvis yesterday.  Findings: Persistent dilation of multiple loops of small bowel throughout the abdomen, demonstrating air fluid levels on the lateral decubitus image, not significantly changed since yesterday. No evidence of free intraperitoneal air.  Contrast material within the renal collecting systems and urinary bladder from the CT  yesterday.  Surgical clips in the left mid abdomen and in the pelvis.  Degenerative changes involving the lumbar spine. Nasogastric tube tip in the fundus of the stomach.  IMPRESSION: No significant change since yesterday in the partial small bowel obstruction.  No free intraperitoneal air.  Original Report Authenticated By: Arnell Sieving, M.D.   Dg Abd Acute W/chest  04/16/2011  *RADIOLOGY REPORT*  Clinical Data: Abdominal pain with nausea and vomiting.  Small bowel obstruction.  ACUTE ABDOMEN SERIES (ABDOMEN 2 VIEW & CHEST 1 VIEW)  Comparison: Radiographs 04/15/2011 and 04/14/2011.  CT 04/12/2011.  Findings: Nasogastric tube projects into the mid stomach.  There are persistent bilateral air space opacities with worsening air space disease and volume loss in the left lower lobe.  There are probable enlarging left greater than right pleural effusions.  The heart size is stable.  There is enteric contrast material within the colon. Small bowel distension in the pelvis has improved.  There is no evidence of free intraperitoneal air.  There are multiple surgical clips in the lower abdomen and pelvis.  IMPRESSION:  1.  Improving small bowel distension in the pelvis.  Enteric contrast has passed into the colon. 2.  Worsening pulmonary edema, left lower lobe air space disease and bilateral pleural effusions.  Consider aspiration pneumonia.  Original Report Authenticated By: Gerrianne Scale, M.D.  Dg Abd Portable 1v  04/14/2011  *RADIOLOGY REPORT*  Clinical Data: 77 year old female with small bowel obstruction.  ABDOMEN - 1 VIEW  Comparison: 04/13/2011 and earlier.  Findings: AP portable supine view at 0840 hours.  Decreased gas filled small bowel loops in the lower abdomen and pelvis.  Oral contrast is increased in the right colon.  Multiple surgical clips re-identified in the lower abdomen and pelvis. Stable visualized osseous structures.  Bladder decompressed.  IMPRESSION: Improved bowel gas pattern with  decreased small bowel loops and increased contrast transit to the right colon.  Original Report Authenticated By: Harley Hallmark, M.D.   Dg Abd Portable 2v  04/15/2011  *RADIOLOGY REPORT*  Clinical Data: Small bowel obstruction  ABDOMEN - 2 VIEW  Comparison: 04/14/2011  Findings: Mildly prominent loops of small bowel in the left lower abdomen measuring up to 4.2 cm.  Contrast progressing into the distal transverse colon.  Surgical clips in the lower abdomen/pelvis.  No evidence of free air on the lateral decubitus view.  Degenerative changes of the visualized thoracolumbar spine.  IMPRESSION: Mildly prominent loops of small bowel in the left lower abdomen. Contrast progressing into the distal transverse colon.  No evidence of free air on the lateral decubitus view.  Original Report Authenticated By: Charline Bills, M.D.    PHYSICAL EXAM    patient is stable today. Cath sites have healed nicely.   TELEMETRY: I personally reviewed her telemetry. She has sinus rhythm.   ASSESSMENT AND PLAN:  Principal Problem:  *SBO (small bowel obstruction) Active Problems:   Acute myocardial infarction, subendocardial infarction, subsequent episode of care       The patient has had no recurrent problems since pain in her right arm early in her hospitalization when she was very ill. Attempted catheterization was unsuccessful. They could not obtain arterial access. In the future if absolutely necessary her left wrist could be tried. Medical therapy of her coronary disease at this time. My team will arrange for her to see me in the office in approximately 4 weeks. She lives here in [place]. From the cardiac viewpoint she is stable to be discharged home today if her other problems are stable per her primary team.       Hypertension  UTI (urinary tract infection)  Protein calorie malnutrition   Monica Rhodes 04/26/2011 8:00 AM

## 2011-04-26 NOTE — Progress Notes (Signed)
Subjective: Patient seen.Denies any chest pain or sob.She also denies any fever,chills or rigors. Patient wants to go home.  Objective: Weight change: 2.268 kg (5 lb)  Intake/Output Summary (Last 24 hours) at 04/26/11 1123 Last data filed at 04/26/11 0916  Gross per 24 hour  Intake 1432.08 ml  Output    502 ml  Net 930.08 ml   BP 165/70  Pulse 62  Temp(Src) 98.5 F (36.9 C) (Oral)  Resp 16  Ht 5\' 1"  (1.549 m)  Wt 61.009 kg (134 lb 8 oz)  BMI 25.41 kg/m2  SpO2 93% Physical Exam: General appearance: alert, cooperative and no distress Head: Normocephalic, without obvious abnormality, atraumatic Neck: no adenopathy, no carotid bruit, no JVD, supple, symmetrical, trachea midline and thyroid not enlarged, symmetric, no tenderness/mass/nodules Lungs: clear to auscultation bilaterally Heart: regular rate and rhythm, soft systolic murmur,no  click, rub or gallop Abdomen: soft, non-tender; bowel sounds normal; no masses,  no organomegaly Extremities: extremities normal, atraumatic, no cyanosis or edema Skin: Skin color, texture, turgor normal. No rashes or lesions  Lab Results: @labtest @  Micro Results: No results found for this or any previous visit (from the past 240 hour(s)).  Studies/Results: Dg Chest 2 View  04/17/2011  *RADIOLOGY REPORT*  Clinical Data: Chest pain, short of breath, nausea and vomiting  CHEST - 2 VIEW  Comparison: Portable chest x-ray of 04/14/2011  Findings: The lungs are hyperaerated.  There is cardiomegaly present with mild pulmonary vascular congestion and small effusions most consistent with mild congestive heart failure.  The bones are diffusely osteopenic.  An NG tube extends below the hemidiaphragm.  IMPRESSION: COPD.  Probable mild CHF with small effusions.  Original Report Authenticated By: Juline Patch, M.D.   Ct Abdomen Pelvis W Contrast  04/13/2011  *RADIOLOGY REPORT*  Clinical Data: Nominal pain and vomiting  CT ABDOMEN AND PELVIS WITH CONTRAST   Technique:  Multidetector CT imaging of the abdomen and pelvis was performed following the standard protocol during bolus administration of intravenous contrast.  Contrast: 80mL OMNIPAQUE IOHEXOL 300 MG/ML IV SOLN  Comparison: None.  Findings: Mild dependent atelectasis in the lung bases.  Small pericardial effusions similar to previous study.  Small upper abdominal effusion around the upper liver and spleen.  This is new since the previous study.  Increased density within the gallbladder consistent with sludge.  No gallbladder wall thickening or stones demonstrated.  The liver, spleen, and pancreas are otherwise unremarkable.  No adrenal gland nodules.  Prominent diffuse calcific atherosclerotic changes throughout the abdominal aorta and branch vessels.  Focal significant narrowing of the distal abdominal aorta just before the bifurcation. Decreased flow in the external iliac arteries consistent with significant stenosis. Renal atrophy and parenchymal cysts.  No hydronephrosis. Retroperitoneal lymph nodes are not pathologically enlarged.  No free air or free fluid in the abdomen.  The stomach is normal. Lower abdominal and pelvic small bowel loops are distended with air fluid levels.  The terminal ileum is decompressed.  Thickening of the wall of the terminal ileum suggesting stricture, possibly representing inflammatory bowel disease.  Pelvis:  Partial left colectomy with left lower quadrant colostomy. Stool filled colon without distension or wall thickening.  No free or loculated pelvic fluid collections.  Compressed. The appendix is normal.  Degenerative changes in the lumbar spine with normal alignment.  IMPRESSION:  1.  Decompressed distal ileum with wall thickening suggesting possible inflammatory stricture.  Proximal small bowel obstruction. 2.  Partial left colectomy with left lower quadrant colostomy. 3.  Extensive calcific atherosclerotic changes throughout the abdominal aorta and branch vessels with  suggestion of significant stenosis of the distal aorta and iliac bifurcation region.  Poor flow demonstrated in the external iliac arteries. 4.  Small amount of upper abdominal ascites.  Small pericardial effusion. 5.  Gallbladder sludge.  Original Report Authenticated By: Marlon Pel, M.D.   Dg Chest Portable 1 View  04/14/2011  *RADIOLOGY REPORT*  Clinical Data: Shortness of breath.  Hypoxia.  Myocardial infarction.  PORTABLE CHEST - 1 VIEW  Comparison: 12/20/2005  Findings: Considerable bilateral interstitial accentuation is present in both lungs, favoring acute pulmonary edema over atypical pneumonia.  Mild cardiomegaly is present.  Nasogastric tube extends into the stomach.  Thoracic spondylosis noted.  IMPRESSION:  1.  Diffuse prominent interstitial opacity in the lungs favoring interstitial edema over atypical pneumonia. 2.  Mild cardiomegaly.  Original Report Authenticated By: Dellia Cloud, M.D.   Dg Abd 2 Views  04/13/2011  *RADIOLOGY REPORT*  Clinical Data: Follow up small bowel obstruction.  ABDOMEN - 2 VIEW 04/13/2011:  Comparison: CT abdomen and pelvis yesterday.  Findings: Persistent dilation of multiple loops of small bowel throughout the abdomen, demonstrating air fluid levels on the lateral decubitus image, not significantly changed since yesterday. No evidence of free intraperitoneal air.  Contrast material within the renal collecting systems and urinary bladder from the CT yesterday.  Surgical clips in the left mid abdomen and in the pelvis.  Degenerative changes involving the lumbar spine. Nasogastric tube tip in the fundus of the stomach.  IMPRESSION: No significant change since yesterday in the partial small bowel obstruction.  No free intraperitoneal air.  Original Report Authenticated By: Arnell Sieving, M.D.   Dg Abd Acute W/chest  04/16/2011  *RADIOLOGY REPORT*  Clinical Data: Abdominal pain with nausea and vomiting.  Small bowel obstruction.  ACUTE ABDOMEN  SERIES (ABDOMEN 2 VIEW & CHEST 1 VIEW)  Comparison: Radiographs 04/15/2011 and 04/14/2011.  CT 04/12/2011.  Findings: Nasogastric tube projects into the mid stomach.  There are persistent bilateral air space opacities with worsening air space disease and volume loss in the left lower lobe.  There are probable enlarging left greater than right pleural effusions.  The heart size is stable.  There is enteric contrast material within the colon. Small bowel distension in the pelvis has improved.  There is no evidence of free intraperitoneal air.  There are multiple surgical clips in the lower abdomen and pelvis.  IMPRESSION:  1.  Improving small bowel distension in the pelvis.  Enteric contrast has passed into the colon. 2.  Worsening pulmonary edema, left lower lobe air space disease and bilateral pleural effusions.  Consider aspiration pneumonia.  Original Report Authenticated By: Gerrianne Scale, M.D.   Dg Abd Portable 1v  04/14/2011  *RADIOLOGY REPORT*  Clinical Data: 77 year old female with small bowel obstruction.  ABDOMEN - 1 VIEW  Comparison: 04/13/2011 and earlier.  Findings: AP portable supine view at 0840 hours.  Decreased gas filled small bowel loops in the lower abdomen and pelvis.  Oral contrast is increased in the right colon.  Multiple surgical clips re-identified in the lower abdomen and pelvis. Stable visualized osseous structures.  Bladder decompressed.  IMPRESSION: Improved bowel gas pattern with decreased small bowel loops and increased contrast transit to the right colon.  Original Report Authenticated By: Harley Hallmark, M.D.   Dg Abd Portable 2v  04/15/2011  *RADIOLOGY REPORT*  Clinical Data: Small bowel obstruction  ABDOMEN - 2 VIEW  Comparison:  04/14/2011  Findings: Mildly prominent loops of small bowel in the left lower abdomen measuring up to 4.2 cm.  Contrast progressing into the distal transverse colon.  Surgical clips in the lower abdomen/pelvis.  No evidence of free air on the  lateral decubitus view.  Degenerative changes of the visualized thoracolumbar spine.  IMPRESSION: Mildly prominent loops of small bowel in the left lower abdomen. Contrast progressing into the distal transverse colon.  No evidence of free air on the lateral decubitus view.  Original Report Authenticated By: Charline Bills, M.D.   Medications: Scheduled Meds:   . aspirin  324 mg Oral Daily  . cloNIDine  0.2 mg Oral BID  . enalapril  40 mg Oral Daily  . Flora-Q  1 capsule Oral Daily  . furosemide  20 mg Intravenous Once  . hydrALAZINE  25 mg Oral Q8H  . insulin aspart  0-5 Units Subcutaneous QHS  . insulin aspart  0-9 Units Subcutaneous TID WC  . lip balm  1 application Topical BID  . metoprolol tartrate  50 mg Oral BID  . nitroGLYCERIN  1 inch Topical Q6H  . pantoprazole  40 mg Oral Q1200  . polyethylene glycol  17 g Oral Daily  . psyllium  1 packet Oral BID  . sodium chloride  10 mL Intracatheter Q12H  . DISCONTD: feeding supplement  237 mL Oral Daily   Continuous Infusions:  PRN Meds:.acetaminophen, bisacodyl, hydrALAZINE, morphine, ondansetron (ZOFRAN) IV, oxyCODONE-acetaminophen, sodium chloride  Assessment/Plan SBO (small bowel obstruction)-Resolved Acute myocardial infarction, subendocardial infarction-medical management Hypertension-blood pressure uncontrolled.Will add schedule dose of hydralazine to regimen UTI (urinary tract infection)-Resolved Protein calorie malnutrition-Encourage increase calorie intake Anemia s/p prbc transfusion-monitor H&H   LOS: 14 days   Harveer Sadler 04/26/2011, 11:23 AM

## 2011-04-27 LAB — CBC
HCT: 29.1 % — ABNORMAL LOW (ref 36.0–46.0)
MCV: 89.3 fL (ref 78.0–100.0)
Platelets: 213 10*3/uL (ref 150–400)
RBC: 3.26 MIL/uL — ABNORMAL LOW (ref 3.87–5.11)
WBC: 7.6 10*3/uL (ref 4.0–10.5)

## 2011-04-27 LAB — COMPREHENSIVE METABOLIC PANEL
AST: 25 U/L (ref 0–37)
Albumin: 2.2 g/dL — ABNORMAL LOW (ref 3.5–5.2)
Alkaline Phosphatase: 87 U/L (ref 39–117)
CO2: 26 mEq/L (ref 19–32)
Chloride: 107 mEq/L (ref 96–112)
Creatinine, Ser: 0.87 mg/dL (ref 0.50–1.10)
GFR calc non Af Amer: 61 mL/min — ABNORMAL LOW (ref >90)
Potassium: 3.9 mEq/L (ref 3.5–5.1)
Total Bilirubin: 0.2 mg/dL — ABNORMAL LOW (ref 0.3–1.2)

## 2011-04-27 MED ORDER — FLORA-Q PO CAPS: 1.0000 | ORAL_CAPSULE | Freq: Every day | ORAL | Status: DC

## 2011-04-27 MED ORDER — CLONIDINE HCL 0.2 MG PO TABS: 0.2000 mg | ORAL_TABLET | Freq: Two times a day (BID) | ORAL | Status: DC

## 2011-04-27 MED ORDER — HYDRALAZINE HCL 25 MG PO TABS: 25.0000 mg | ORAL_TABLET | Freq: Three times a day (TID) | ORAL | Status: DC

## 2011-04-27 MED ORDER — METOPROLOL TARTRATE 50 MG PO TABS: 50.0000 mg | ORAL_TABLET | Freq: Two times a day (BID) | ORAL | Status: DC

## 2011-04-27 MED ORDER — ASPIRIN 81 MG PO CHEW: 81.0000 mg | CHEWABLE_TABLET | Freq: Every day | ORAL | Status: DC

## 2011-04-27 MED ORDER — NITROGLYCERIN 0.4 MG SL SUBL: 0.4000 mg | SUBLINGUAL_TABLET | SUBLINGUAL | Status: DC | PRN

## 2011-04-27 MED ORDER — ENALAPRIL MALEATE 20 MG PO TABS: 40.0000 mg | ORAL_TABLET | Freq: Every day | ORAL | Status: DC

## 2011-04-27 MED ORDER — PANTOPRAZOLE SODIUM 40 MG PO TBEC: 40.0000 mg | DELAYED_RELEASE_TABLET | Freq: Every day | ORAL | Status: DC

## 2011-04-27 NOTE — Discharge Summary (Signed)
DISCHARGE SUMMARY Monica Rhodes is an 77 year old Caucasian female with history of colon CA status post partial bowel resection was admitted to the hospital on 04/13/1999 by Dr. Orvan Falconer with history of abdominal pain with associated nausea and vomiting. No history of fever no chills or rigors. CT of the abdomen confirm partial obstruction and subsequently Monica Rhodes was admitted. During this hospitalization Monica Rhodes was also found to have non-ST MI. Attempts at cardiac catheterization was unsuccessful. Monica Rhodes was medically managed for the non-ST MI. Also nonsurgically manage for the partial bowel obstruction.  Monica Rhodes  MR#: [id number]  DOB:[birth year]  Date of Admission: 04/12/2011 Date of Discharge: 04/27/2011  Attending Physician:Heloise Gordan  Monica Rhodes's ION:GEXB,MWUXLK K, MD  Consults:Treatment Team:  [facility]  Discharge Diagnoses: #1 Small partial bowel obstruction-resolved #2 non-ST elevated MI-medically managed #3 hypertension #4 UTI #5 history of a colon CA status post partial bowel resection. #6 protein calorie malnutrition     Current Discharge Medication List    START taking these medications   Details  aspirin 81 MG chewable tablet Chew 1 tablet (81 mg total) by mouth daily. Qty: 30 tablet, Refills: 1    !! cloNIDine (CATAPRES) 0.2 MG tablet Take 1 tablet (0.2 mg total) by mouth 2 (two) times daily. Qty: 50 tablet, Refills: 1    enalapril (VASOTEC) 20 MG tablet Take 2 tablets (40 mg total) by mouth daily. Qty: 60 tablet, Refills: 1    Flora-Q (FLORA-Q) CAPS Take 1 capsule by mouth daily. Qty: 30 capsule, Refills: 1    hydrALAZINE (APRESOLINE) 25 MG tablet Take 1 tablet (25 mg total) by mouth every 8 (eight) hours. Qty: 50 tablet, Refills: 1    metoprolol (LOPRESSOR) 50 MG tablet Take 1 tablet (50 mg total) by mouth 2 (two) times daily. Qty: 60 tablet, Refills: 1    nitroGLYCERIN (NITROSTAT) 0.4 MG SL tablet Place 1 tablet (0.4 mg total)  under the tongue every 5 (five) minutes as needed for chest pain. Qty: 30 tablet, Refills: 1    pantoprazole (PROTONIX) 40 MG tablet Take 1 tablet (40 mg total) by mouth daily at 12 noon. Qty: 30 tablet, Refills: 1     !! - Potential duplicate medications found. Please discuss with provider.    CONTINUE these medications which have NOT CHANGED   Details  aliskiren (TEKTURNA) 150 MG tablet Take 150 mg by mouth daily.      !! cloNIDine (CATAPRES) 0.2 MG tablet Take 0.2 mg by mouth 2 (two) times daily.      digoxin (LANOXIN) 0.125 MG tablet Take 125 mcg by mouth daily.      fenofibrate 160 MG tablet Take 160 mg by mouth daily.      metFORMIN (GLUCOPHAGE) 500 MG tablet Take 250 mg by mouth daily with breakfast.      prazosin (MINIPRESS) 2 MG capsule Take 2 mg by mouth 2 (two) times daily.       !! - Potential duplicate medications found. Please discuss with provider.        Hospital Course: Monica Rhodes was admitted to telemetry for partial bowel obstruction. She was nonsurgically managed. She was also found to have non-ST MI. Attempt at cardiac catheterization was unsuccessful. She was medically managed. In this hospitalization which was also treated for UTI. Monica Rhodes blood pressure was maintained with clonidine and Vasotec.Monica Rhodes was seen by me for the first time in this index admission on 04/26/11.During this encounter Monica Rhodes remained medically stable, blood pressure remained labile  Hydralazine p.o was added to patients regimen for  adequate blood pressure control. So far Monica Rhodes has remained medically stable and the plan is for Monica Rhodes to be discharged home today.  Day of Discharge BP 160/82  Pulse 58  Temp(Src) 98 F (36.7 C) (Oral)  Resp 20  Ht 5\' 1"  (1.549 m)  Wt 61.6 kg (135 lb 12.9 oz)  BMI 25.66 kg/m2  SpO2 93%  Physical Exam: HEENT-pallor NECK-no jvd or lymphadenopathy CHEST-clear CVS-S1 and s2 ABD-soft.no organs palpable and bowel sounds are present. EXT-no pedal  edema NEURO-non focal SKIN-decrease turgor MSS-arthritic changes in knees and feet  Results for orders placed during the hospital encounter of 04/12/11 (from the past 24 hour(s))  GLUCOSE, CAPILLARY     Status: Abnormal   Collection Time   04/26/11 11:26 AM      Component Value Range   Glucose-Capillary 132 (*) 70 - 99 (mg/dL)   Comment 1 Notify RN    GLUCOSE, CAPILLARY     Status: Abnormal   Collection Time   04/26/11  4:08 PM      Component Value Range   Glucose-Capillary 142 (*) 70 - 99 (mg/dL)  GLUCOSE, CAPILLARY     Status: Abnormal   Collection Time   04/26/11  8:42 PM      Component Value Range   Glucose-Capillary 136 (*) 70 - 99 (mg/dL)   Comment 1 Notify RN    CBC     Status: Abnormal   Collection Time   04/27/11  6:47 AM      Component Value Range   WBC 7.6  4.0 - 10.5 (K/uL)   RBC 3.26 (*) 3.87 - 5.11 (MIL/uL)   Hemoglobin 9.6 (*) 12.0 - 15.0 (g/dL)   HCT 53.6 (*) 64.4 - 46.0 (%)   MCV 89.3  78.0 - 100.0 (fL)   MCH 29.4  26.0 - 34.0 (pg)   MCHC 33.0  30.0 - 36.0 (g/dL)   RDW 03.4  74.2 - 59.5 (%)   Platelets 213  150 - 400 (K/uL)  COMPREHENSIVE METABOLIC PANEL     Status: Abnormal   Collection Time   04/27/11  6:47 AM      Component Value Range   Sodium 142  135 - 145 (mEq/L)   Potassium 3.9  3.5 - 5.1 (mEq/L)   Chloride 107  96 - 112 (mEq/L)   CO2 26  19 - 32 (mEq/L)   Glucose, Bld 121 (*) 70 - 99 (mg/dL)   BUN 22  6 - 23 (mg/dL)   Creatinine, Ser 6.38  0.50 - 1.10 (mg/dL)   Calcium 9.0  8.4 - 75.6 (mg/dL)   Total Protein 5.4 (*) 6.0 - 8.3 (g/dL)   Albumin 2.2 (*) 3.5 - 5.2 (g/dL)   AST 25  0 - 37 (U/L)   ALT 20  0 - 35 (U/L)   Alkaline Phosphatase 87  39 - 117 (U/L)   Total Bilirubin 0.2 (*) 0.3 - 1.2 (mg/dL)   GFR calc non Af Amer 61 (*) >90 (mL/min)   GFR calc Af Amer 70 (*) >90 (mL/min)  GLUCOSE, CAPILLARY     Status: Abnormal   Collection Time   04/27/11  7:44 AM      Component Value Range   Glucose-Capillary 118 (*) 70 - 99 (mg/dL)    Comment 1 Notify RN      Disposition: Stable  Follow-up Appts: Discharge Orders    Future Orders Please Complete By Expires   Diet - low sodium heart healthy      Increase  activity slowly      Discharge instructions         Follow-up with primary care physician in 1-2 weeks   Signed: Natanel Snavely 04/27/2011, 9:20 AM

## 2011-04-27 NOTE — Progress Notes (Signed)
I discharged Ms. Monica Rhodes. She  Received a list of medications which she currently took at home, as well as medications that she will be taking.  She received a list of community resources with telephone numbers attached.  The patient's grandson and other family member held onto the discharge packet for Ms. Monica Rhodes.  Ms. Monica Rhodes explained that she would follow-up with her primary care physician. She didn't have any questions, and verbalized understanding.

## 2011-05-29 ENCOUNTER — Encounter: Payer: Medicare Other | Admitting: Cardiology

## 2011-06-08 ENCOUNTER — Encounter: Payer: Self-pay | Admitting: *Deleted

## 2011-06-08 ENCOUNTER — Encounter: Payer: Self-pay | Admitting: Cardiology

## 2011-06-08 DIAGNOSIS — E119 Type 2 diabetes mellitus without complications: Secondary | ICD-10-CM | POA: Insufficient documentation

## 2011-06-08 DIAGNOSIS — I2510 Atherosclerotic heart disease of native coronary artery without angina pectoris: Secondary | ICD-10-CM | POA: Insufficient documentation

## 2011-06-08 DIAGNOSIS — I519 Heart disease, unspecified: Secondary | ICD-10-CM | POA: Insufficient documentation

## 2011-06-08 DIAGNOSIS — J449 Chronic obstructive pulmonary disease, unspecified: Secondary | ICD-10-CM | POA: Insufficient documentation

## 2011-06-08 DIAGNOSIS — I340 Nonrheumatic mitral (valve) insufficiency: Secondary | ICD-10-CM | POA: Insufficient documentation

## 2011-06-08 DIAGNOSIS — I739 Peripheral vascular disease, unspecified: Secondary | ICD-10-CM | POA: Insufficient documentation

## 2011-06-08 DIAGNOSIS — E785 Hyperlipidemia, unspecified: Secondary | ICD-10-CM | POA: Insufficient documentation

## 2011-06-08 DIAGNOSIS — IMO0002 Reserved for concepts with insufficient information to code with codable children: Secondary | ICD-10-CM | POA: Insufficient documentation

## 2011-06-08 DIAGNOSIS — Z720 Tobacco use: Secondary | ICD-10-CM | POA: Insufficient documentation

## 2011-06-08 DIAGNOSIS — R9430 Abnormal result of cardiovascular function study, unspecified: Secondary | ICD-10-CM | POA: Insufficient documentation

## 2011-06-09 ENCOUNTER — Encounter: Payer: Self-pay | Admitting: Cardiology

## 2011-06-09 ENCOUNTER — Ambulatory Visit (INDEPENDENT_AMBULATORY_CARE_PROVIDER_SITE_OTHER): Payer: Medicare Other | Admitting: Cardiology

## 2011-06-09 DIAGNOSIS — I739 Peripheral vascular disease, unspecified: Secondary | ICD-10-CM

## 2011-06-09 DIAGNOSIS — I10 Essential (primary) hypertension: Secondary | ICD-10-CM

## 2011-06-09 DIAGNOSIS — K56609 Unspecified intestinal obstruction, unspecified as to partial versus complete obstruction: Secondary | ICD-10-CM

## 2011-06-09 DIAGNOSIS — I779 Disorder of arteries and arterioles, unspecified: Secondary | ICD-10-CM

## 2011-06-09 DIAGNOSIS — R21 Rash and other nonspecific skin eruption: Secondary | ICD-10-CM

## 2011-06-09 DIAGNOSIS — R609 Edema, unspecified: Secondary | ICD-10-CM | POA: Insufficient documentation

## 2011-06-09 DIAGNOSIS — I2510 Atherosclerotic heart disease of native coronary artery without angina pectoris: Secondary | ICD-10-CM

## 2011-06-09 NOTE — Progress Notes (Signed)
HPI  Patient is seen post hospitalizationFor the followup of her overall cardiac status. The patient is very complex. Recently she was hospitalized with recurrent small bowel obstruction. While in the hospital she had some arm pain and diffuse ST depression. There were positive cardiac enzymes and she was treated as a non-STEMI. As she recovered from her GI status decision was made to proceed with cardiac catheterization. Our team was never able to obtain complete access to study her coronaries. Attempts were made through both femoral arteries and her right radial artery. If in the future urgent evaluation of her coronary arteries as needed, the left radial would have to be tried. The patient's echo revealed good LV function. Therefore it was decided to follow her overall cardiac status medically.  She is here today looking relatively stable. She's not had chest pain. She is complaining of some mild edema.  As part of today's evaluation I have reviewed the extensive records from her hospitalization and updated the electronic medical record.  Allergies  Allergen Reactions  . Promethazine Other (See Comments)    Unknown     Current Outpatient Prescriptions  Medication Sig Dispense Refill  . cloNIDine (CATAPRES) 0.2 MG tablet Take 0.2 mg by mouth 2 (two) times daily.        . digoxin (LANOXIN) 0.125 MG tablet Take 125 mcg by mouth daily.        . enalapril (VASOTEC) 20 MG tablet Take 2 tablets (40 mg total) by mouth daily.  60 tablet  1  . fenofibrate 160 MG tablet Take 160 mg by mouth daily.        . hydrochlorothiazide (HYDRODIURIL) 25 MG tablet Take 25 mg by mouth daily.        . metoprolol (LOPRESSOR) 50 MG tablet Take 1 tablet (50 mg total) by mouth 2 (two) times daily.  60 tablet  1  . nitroGLYCERIN (NITROSTAT) 0.4 MG SL tablet Place 1 tablet (0.4 mg total) under the tongue every 5 (five) minutes as needed for chest pain.  30 tablet  1  . pantoprazole (PROTONIX) 40 MG tablet Take 1 tablet  (40 mg total) by mouth daily at 12 noon.  30 tablet  1  . prazosin (MINIPRESS) 2 MG capsule Take 2 mg by mouth 2 (two) times daily.        . ranitidine (ZANTAC) 150 MG capsule Take 150 mg by mouth 2 (two) times daily.          History   Social History  . Marital Status: Widowed    Spouse Name: N/A    Number of Children: N/A  . Years of Education: N/A   Occupational History  . Not on file.   Social History Main Topics  . Smoking status: Current Some Day Smoker -- 0.5 packs/day    Types: Cigarettes  . Smokeless tobacco: Not on file  . Alcohol Use: No  . Drug Use: No  . Sexually Active: Not on file   Other Topics Concern  . Not on file   Social History Narrative  . No narrative on file    Family History  Problem Relation Age of Onset  . Coronary artery disease      Past Medical History  Diagnosis Date  . Hypertension   . UTI (urinary tract infection)   . SBO (small bowel obstruction)     Recurrent, prolonged hospitalization November, 2012  . Protein calorie malnutrition   . Hyperlipidemia   . DM (diabetes mellitus)   .  COPD (chronic obstructive pulmonary disease)   . PVD (peripheral vascular disease)   . Colon cancer   . Bradycardia   . Tobacco abuse   . CAD (coronary artery disease)     Non-STEMI November, 2012,, Cardiac catheterization was attempted, despite significant efforts catheters could not be progressed to the coronary arteries ., Therefore medical therapy  . Carotid artery disease     Doppler  April 16, 2011,, total occlusion LICA, 60-79% R. ICA  . PAD (peripheral artery disease)     Suggestions significant stenosis distal aorta by CT angiogram  November, 2012  . Ejection fraction     EF 60%, echo, November, 2012,  . Mitral regurgitation     Moderate, echo, November, 2012  . Right ventricular dysfunction     Moderate, echo, November, 2012  . Edema     December, 2012    Past Surgical History  Procedure Date  . Colostomy   .  Abdominoperineal proctocolectomy     ROS   Patient denies fever, chills, headache, sweats, rash, change in vision, change in hearing, chest pain, cough, nausea vomiting, urinary symptoms. All other systems are reviewed and are negative.  PHYSICAL EXAM  The patient is frail but stable. She is oriented to person time and place. She is here with her son. Head is atraumatic. She has mild drooping of the left eyelid. There is no jugular venous distention. Lungs are clear. Respiratory effort is nonlabored. Cardiac exam reveals S1 and S2. There are no clicks or significant murmurs. The abdomen is soft. She does have 1+ peripheral edema. She has kyphoscoliosis of the spine.  Filed Vitals:   06/09/11 0917  BP: 155/60  Pulse: 56  Weight: 125 lb (56.7 kg)    ASSESSMENT & PLAN

## 2011-06-09 NOTE — Progress Notes (Deleted)
HPI The patient is here for preop cardiology clearance for a hernia repair. The patient's hernia is related to a left nephrectomy that she had in the past. Her cardiac status currently is stable. As part of today's evaluation I have reviewed all of the prior records within the [facility] and our records. I have updated the electronic medical record. Patient was seen last in our regional office in 2009. Historically she had had some chest pain in the past. She actually underwent cardiac catheterization in 2006 and 2009. On both occasions she had no significant coronary artery disease and she had normal left ventricular function. Echo in 2009 also revealed normal left ventricular function. She has not had a recent MI and she has no arrhythmias. There is no congestive heart failure. There is no chest pain.  She has not had syncope or presyncope.  Allergies  Allergen Reactions  . Promethazine Other (See Comments)    Unknown     Current Outpatient Prescriptions  Medication Sig Dispense Refill  . aliskiren (TEKTURNA) 150 MG tablet Take 150 mg by mouth daily.        Marland Kitchen aspirin 81 MG chewable tablet Chew 1 tablet (81 mg total) by mouth daily.  30 tablet  1  . cloNIDine (CATAPRES) 0.2 MG tablet Take 0.2 mg by mouth 2 (two) times daily.        . cloNIDine (CATAPRES) 0.2 MG tablet Take 1 tablet (0.2 mg total) by mouth 2 (two) times daily.  50 tablet  1  . digoxin (LANOXIN) 0.125 MG tablet Take 125 mcg by mouth daily.        . enalapril (VASOTEC) 20 MG tablet Take 2 tablets (40 mg total) by mouth daily.  60 tablet  1  . fenofibrate 160 MG tablet Take 160 mg by mouth daily.        Donald Prose (FLORA-Q) CAPS Take 1 capsule by mouth daily.  30 capsule  1  . metFORMIN (GLUCOPHAGE) 500 MG tablet Take 250 mg by mouth daily with breakfast.        . metoprolol (LOPRESSOR) 50 MG tablet Take 1 tablet (50 mg total) by mouth 2 (two) times daily.  60 tablet  1  . nitroGLYCERIN (NITROSTAT) 0.4 MG SL tablet Place 1  tablet (0.4 mg total) under the tongue every 5 (five) minutes as needed for chest pain.  30 tablet  1  . pantoprazole (PROTONIX) 40 MG tablet Take 1 tablet (40 mg total) by mouth daily at 12 noon.  30 tablet  1  . prazosin (MINIPRESS) 2 MG capsule Take 2 mg by mouth 2 (two) times daily.          History   Social History  . Marital Status: Widowed    Spouse Name: N/A    Number of Children: N/A  . Years of Education: N/A   Occupational History  . Not on file.   Social History Main Topics  . Smoking status: Current Some Day Smoker -- 0.5 packs/day    Types: Cigarettes  . Smokeless tobacco: Not on file  . Alcohol Use: No  . Drug Use: No  . Sexually Active: Not on file   Other Topics Concern  . Not on file   Social History Narrative  . No narrative on file    Family History  Problem Relation Age of Onset  . Coronary artery disease      Past Medical History  Diagnosis Date  . Hypertension   . UTI (  urinary tract infection)   . SBO (small bowel obstruction)     Recurrent, prolonged hospitalization November, 2012  . Protein calorie malnutrition   . Hyperlipidemia   . DM (diabetes mellitus)   . COPD (chronic obstructive pulmonary disease)   . PVD (peripheral vascular disease)   . Colon cancer   . Bradycardia   . Tobacco abuse   . CAD (coronary artery disease)     Non-STEMI November, 2012,, Cardiac catheterization was attempted, despite significant efforts catheters could not be progressed to the coronary arteries ., Therefore medical therapy  . Carotid artery disease     History severe disease in the internal carotid with normal right internal carotid, will need to find the most recent carotid Doppler, question also of some subclavian stenosis  . PAD (peripheral artery disease)     Suggestions significant stenosis distal aorta by CT angiogram  November, 2012  . Ejection fraction     EF 60%, echo, November, 2012,  . Mitral regurgitation     Moderate, echo, November,  2012  . Right ventricular dysfunction     Moderate, echo, November, 2012    Past Surgical History  Procedure Date  . Colostomy   . Abdominoperineal proctocolectomy     ROS   Patient denies fever, chills, headache, sweats, rash, change in vision, change in hearing, chest pain, cough, nausea vomiting, urinary symptoms. All other systems are reviewed and are negative.  PHYSICAL EXAM  Patient is overweight. She is oriented to person time and place. Affect is normal. Head is atraumatic. There is no xanthelasma. There is no carotid bruit. There is no jugulovenous distention. Lungs are clear. Respiratory effort is nonlabored. Cardiac exam reveals S1 and S2. There are no clicks or significant murmurs. The abdomen is soft. There is no peripheral edema. There no musculoskeletal deformities. There are no skin rashes.  Filed Vitals:   06/09/11 0917  BP: 155/60  Pulse: 56  Weight: 125 lb (56.7 kg)    EKG  EKG is done today and reviewed by me. There is sinus rhythm with a few PACs. There is no significant abnormality.  ASSESSMENT & PLAN

## 2011-06-09 NOTE — Assessment & Plan Note (Signed)
Patient is on a multitude of medications. She has reasonable blood pressure control at this time. No change in therapy.

## 2011-06-09 NOTE — Patient Instructions (Signed)
Your physician recommends that you schedule a follow-up appointment in: 8 weeks  

## 2011-06-09 NOTE — Assessment & Plan Note (Signed)
Fortunately she is stable after her last episode of small bowel obstruction. She was very ill with this.

## 2011-06-09 NOTE — Assessment & Plan Note (Signed)
The patient has some edema at this time. She has normal systolic left ventricular function. She is on a thiazide diuretic. She is drinking excessive fluids. Rather than adjusting her medicines further I have encouraged her to cut back on her extra fluid. I've also encouraged her to keep her feet elevated when she is not walking.Marland Kitchen

## 2011-06-09 NOTE — Assessment & Plan Note (Signed)
I have tried to carefully review her records concerning her vascular disease. She did have a Doppler while in the hospital. She has severe disease. There is total occlusion of the LICA. There is 60-79% stenosis of the RIC A. She will need careful followup of this.

## 2011-06-09 NOTE — Assessment & Plan Note (Signed)
CT angiography suggested that she had significant stenosis of her distal aorta. She is not having symptoms at this time. This will have to be followed.  As part of today's evaluation I have reviewed her records extensively. I have had the research multiple issues. They're all currently documented.

## 2011-06-09 NOTE — Assessment & Plan Note (Signed)
The patient's cardiac status is stable. As outlined above she had a non-STEMI when she had small bowel obstruction. Attempted cardiac catheterization failed as that her coronary arteries were never assessed. In the future attempts could be made to her left radial artery if necessary. Medical therapy is recommended.

## 2011-06-09 NOTE — Assessment & Plan Note (Signed)
Since coming out of the hospital the patient had a diffuse rash. Hydralazine was stopped by her primary physician. I suspect that this was the culprit. She is off hydralazine.

## 2011-06-30 ENCOUNTER — Other Ambulatory Visit: Payer: Self-pay | Admitting: Cardiology

## 2011-06-30 MED ORDER — ENALAPRIL MALEATE 20 MG PO TABS: 40.0000 mg | ORAL_TABLET | Freq: Every day | ORAL | Status: DC

## 2011-06-30 MED ORDER — METOPROLOL TARTRATE 50 MG PO TABS: 50.0000 mg | ORAL_TABLET | Freq: Two times a day (BID) | ORAL | Status: DC

## 2011-06-30 NOTE — Telephone Encounter (Signed)
Refill   Patient son Windy Fast calling to f/u on prescriptions for patient, which have not been faxed over to [facility] yet on [address] in [place], [place] (Listed in File)  enalapril (VASOTEC) 20 MG tablet  metoprolol (LOPRESSOR) 50 MG tablet

## 2011-07-04 ENCOUNTER — Other Ambulatory Visit: Payer: Self-pay

## 2011-07-04 MED ORDER — ENALAPRIL MALEATE 20 MG PO TABS: 40.0000 mg | ORAL_TABLET | Freq: Every day | ORAL | Status: DC

## 2011-07-04 MED ORDER — METOPROLOL TARTRATE 50 MG PO TABS: 50.0000 mg | ORAL_TABLET | Freq: Two times a day (BID) | ORAL | Status: DC

## 2011-08-01 ENCOUNTER — Encounter: Payer: Self-pay | Admitting: Cardiology

## 2011-08-01 ENCOUNTER — Ambulatory Visit (INDEPENDENT_AMBULATORY_CARE_PROVIDER_SITE_OTHER): Payer: Medicare Other | Admitting: Cardiology

## 2011-08-01 DIAGNOSIS — I2510 Atherosclerotic heart disease of native coronary artery without angina pectoris: Secondary | ICD-10-CM

## 2011-08-01 DIAGNOSIS — I10 Essential (primary) hypertension: Secondary | ICD-10-CM

## 2011-08-01 DIAGNOSIS — I739 Peripheral vascular disease, unspecified: Secondary | ICD-10-CM

## 2011-08-01 DIAGNOSIS — I779 Disorder of arteries and arterioles, unspecified: Secondary | ICD-10-CM

## 2011-08-01 NOTE — Progress Notes (Signed)
HPI  Patient is seen today to followup coronary disease. I saw her last June 09, 2011. She had been quite ill in the hospital and stabilized and was seen in outpatient followup. She has normal systolic function. I encouraged her to cut back on her salt and fluid intake. I did not make any other changes. Since then she's doing well. Her blood pressure was elevated recently in the office of her primary care physician. Today he is under better control.  Allergies  Allergen Reactions  . Promethazine Other (See Comments)    Unknown     Current Outpatient Prescriptions  Medication Sig Dispense Refill  . cloNIDine (CATAPRES) 0.2 MG tablet Take 0.2 mg by mouth 2 (two) times daily.        . digoxin (LANOXIN) 0.125 MG tablet Take 125 mcg by mouth daily.        . enalapril (VASOTEC) 20 MG tablet Take 2 tablets (40 mg total) by mouth daily.  60 tablet  2  . fenofibrate 160 MG tablet Take 160 mg by mouth daily.        . hydrochlorothiazide (HYDRODIURIL) 25 MG tablet Take 25 mg by mouth daily.        . metoprolol (LOPRESSOR) 50 MG tablet Take 1 tablet (50 mg total) by mouth 2 (two) times daily.  60 tablet  2  . nitroGLYCERIN (NITROSTAT) 0.4 MG SL tablet Place 1 tablet (0.4 mg total) under the tongue every 5 (five) minutes as needed for chest pain.  30 tablet  1  . pantoprazole (PROTONIX) 40 MG tablet Take 1 tablet (40 mg total) by mouth daily at 12 noon.  30 tablet  1  . prazosin (MINIPRESS) 2 MG capsule Take 2 mg by mouth 2 (two) times daily.        . ranitidine (ZANTAC) 150 MG capsule Take 150 mg by mouth 2 (two) times daily.          History   Social History  . Marital Status: Widowed    Spouse Name: N/A    Number of Children: N/A  . Years of Education: N/A   Occupational History  . Not on file.   Social History Main Topics  . Smoking status: Current Some Day Smoker -- 0.5 packs/day    Types: Cigarettes  . Smokeless tobacco: Not on file  . Alcohol Use: No  . Drug Use: No  .  Sexually Active: Not on file   Other Topics Concern  . Not on file   Social History Narrative  . No narrative on file    Family History  Problem Relation Age of Onset  . Coronary artery disease      Past Medical History  Diagnosis Date  . Hypertension   . UTI (urinary tract infection)   . SBO (small bowel obstruction)     Recurrent, prolonged hospitalization November, 2012  . Protein calorie malnutrition   . Hyperlipidemia   . DM (diabetes mellitus)   . COPD (chronic obstructive pulmonary disease)   . PVD (peripheral vascular disease)   . Colon cancer   . Bradycardia   . Tobacco abuse   . CAD (coronary artery disease)     Non-STEMI November, 2012,, Cardiac catheterization was attempted, despite significant efforts catheters could not be progressed to the coronary arteries ., Therefore medical therapy  . Carotid artery disease     Doppler  April 16, 2011,, total occlusion LICA, 60-79% R. ICA  . PAD (peripheral artery disease)  Suggestions significant stenosis distal aorta by CT angiogram  November, 2012  . Ejection fraction     EF 60%, echo, November, 2012,  . Mitral regurgitation     Moderate, echo, November, 2012  . Right ventricular dysfunction     Moderate, echo, November, 2012  . Edema     December, 2012  . Rash     Rash  probably related to hydralazine  December, 2012    Past Surgical History  Procedure Date  . Colostomy   . Abdominoperineal proctocolectomy     ROS  Patient denies fever, chills, headache, sweats, rash, change in vision, change in hearing, chest pain, cough, nausea vomiting, urinary symptoms. All other systems are reviewed and are negative.  PHYSICAL EXAM Patient is oriented to person time and place. Affect is normal. She's here with a family member. Lungs are clear. Respiratory effort is nonlabored. Cardiac exam reveals S1 and S2. There are no clicks or significant murmurs. The abdomen is soft. There is no peripheral edema. Filed  Vitals:   08/01/11 1400  BP: 140/62  Pulse: 70  Resp: 18  Height: 5\' 2"  (1.575 m)  Weight: 119 lb (53.978 kg)     ASSESSMENT & PLAN

## 2011-08-01 NOTE — Assessment & Plan Note (Signed)
Blood pressure is controlled today. I've chosen not to change her medicine. I've asked her to record her home blood pressures intake these to her primary physician.

## 2011-08-01 NOTE — Patient Instructions (Signed)
Check your blood pressure at home, write them down and report them to Dr Parke Simmers.  Your physician recommends that you schedule a follow-up appointment in: 3 months.

## 2011-08-01 NOTE — Assessment & Plan Note (Signed)
CT angiogram suggested significant stenosis of her distal aorta. She does not have significant symptoms. This will be followed over time.

## 2011-08-01 NOTE — Assessment & Plan Note (Signed)
Coronary disease is stable. No change in therapy. 

## 2011-08-01 NOTE — Assessment & Plan Note (Signed)
The patient has severe carotid artery disease. Her last Doppler was done November, 2012. I will arrange for a followup so that this can be followed at 6 months.

## 2011-09-24 ENCOUNTER — Other Ambulatory Visit: Payer: Self-pay | Admitting: Cardiology

## 2011-11-01 ENCOUNTER — Encounter (INDEPENDENT_AMBULATORY_CARE_PROVIDER_SITE_OTHER): Payer: Medicare Other

## 2011-11-01 DIAGNOSIS — I6529 Occlusion and stenosis of unspecified carotid artery: Secondary | ICD-10-CM

## 2011-11-08 ENCOUNTER — Encounter: Payer: Self-pay | Admitting: Cardiology

## 2011-11-08 ENCOUNTER — Emergency Department (HOSPITAL_COMMUNITY)
Admission: EM | Admit: 2011-11-08 | Discharge: 2011-11-08 | Disposition: A | Discharge status: Home / Self Care | Payer: Medicare Other | Attending: Emergency Medicine | Admitting: Emergency Medicine

## 2011-11-08 ENCOUNTER — Ambulatory Visit: Payer: Medicare Other | Admitting: Cardiology

## 2011-11-08 ENCOUNTER — Encounter (HOSPITAL_COMMUNITY): Payer: Self-pay | Admitting: Emergency Medicine

## 2011-11-08 DIAGNOSIS — Z933 Colostomy status: Secondary | ICD-10-CM | POA: Insufficient documentation

## 2011-11-08 DIAGNOSIS — I10 Essential (primary) hypertension: Secondary | ICD-10-CM | POA: Insufficient documentation

## 2011-11-08 DIAGNOSIS — E119 Type 2 diabetes mellitus without complications: Secondary | ICD-10-CM | POA: Insufficient documentation

## 2011-11-08 DIAGNOSIS — R112 Nausea with vomiting, unspecified: Secondary | ICD-10-CM | POA: Insufficient documentation

## 2011-11-08 DIAGNOSIS — I2510 Atherosclerotic heart disease of native coronary artery without angina pectoris: Secondary | ICD-10-CM | POA: Insufficient documentation

## 2011-11-08 DIAGNOSIS — E860 Dehydration: Secondary | ICD-10-CM | POA: Insufficient documentation

## 2011-11-08 DIAGNOSIS — F17200 Nicotine dependence, unspecified, uncomplicated: Secondary | ICD-10-CM | POA: Insufficient documentation

## 2011-11-08 DIAGNOSIS — Z79899 Other long term (current) drug therapy: Secondary | ICD-10-CM | POA: Insufficient documentation

## 2011-11-08 DIAGNOSIS — Z85038 Personal history of other malignant neoplasm of large intestine: Secondary | ICD-10-CM | POA: Insufficient documentation

## 2011-11-08 LAB — POCT I-STAT TROPONIN I
Troponin i, poc: 0.01 ng/mL (ref 0.00–0.08)
Troponin i, poc: 0.02 ng/mL (ref 0.00–0.08)

## 2011-11-08 LAB — DIFFERENTIAL
Basophils Relative: 0 % (ref 0–1)
Eosinophils Absolute: 0 10*3/uL (ref 0.0–0.7)
Lymphs Abs: 0.9 10*3/uL (ref 0.7–4.0)
Neutro Abs: 10 10*3/uL — ABNORMAL HIGH (ref 1.7–7.7)
Neutrophils Relative %: 87 % — ABNORMAL HIGH (ref 43–77)

## 2011-11-08 LAB — CBC
MCH: 27.2 pg (ref 26.0–34.0)
MCHC: 31.6 g/dL (ref 30.0–36.0)
Platelets: 222 10*3/uL (ref 150–400)
RBC: 4.23 MIL/uL (ref 3.87–5.11)

## 2011-11-08 LAB — URINALYSIS, ROUTINE W REFLEX MICROSCOPIC
Hgb urine dipstick: NEGATIVE
Specific Gravity, Urine: 1.023 (ref 1.005–1.030)
Urobilinogen, UA: 0.2 mg/dL (ref 0.0–1.0)
pH: 5 (ref 5.0–8.0)

## 2011-11-08 LAB — COMPREHENSIVE METABOLIC PANEL
ALT: <5 U/L (ref 0–35)
Albumin: 3.2 g/dL — ABNORMAL LOW (ref 3.5–5.2)
Alkaline Phosphatase: 34 U/L — ABNORMAL LOW (ref 39–117)
Chloride: 102 mEq/L (ref 96–112)
Potassium: 4.4 mEq/L (ref 3.5–5.1)
Sodium: 141 mEq/L (ref 135–145)
Total Protein: 6.2 g/dL (ref 6.0–8.3)

## 2011-11-08 LAB — GLUCOSE, CAPILLARY: Glucose-Capillary: 155 mg/dL — ABNORMAL HIGH (ref 70–99)

## 2011-11-08 LAB — URINE MICROSCOPIC-ADD ON

## 2011-11-08 MED ORDER — ONDANSETRON HCL 4 MG PO TABS: 4.0000 mg | ORAL_TABLET | Freq: Three times a day (TID) | ORAL | Status: AC | PRN

## 2011-11-08 MED ORDER — ONDANSETRON 8 MG PO TBDP: 8.0000 mg | ORAL_TABLET | Freq: Three times a day (TID) | ORAL | Status: DC | PRN

## 2011-11-08 MED ORDER — ONDANSETRON HCL 4 MG/2ML IJ SOLN
4.0000 mg | Freq: Once | INTRAMUSCULAR | Status: AC
  Administered 2011-11-08: 4 mg via INTRAVENOUS
  Filled 2011-11-08: qty 2

## 2011-11-08 MED ORDER — SODIUM CHLORIDE 0.9 % IV BOLUS (SEPSIS)
500.0000 mL | Freq: Once | INTRAVENOUS | Status: AC
  Administered 2011-11-08: 500 mL via INTRAVENOUS

## 2011-11-08 NOTE — ED Notes (Signed)
PT reports Vomiting on Tues in afternoon. Pt has a colostomy and Pt reports normal function . Pt denies any diarrhea.

## 2011-11-08 NOTE — ED Provider Notes (Signed)
History     CSN: [id number]  Arrival date & time 11/08/11  4696   First MD Initiated Contact with Patient 11/08/11 [id number]      Chief Complaint  Patient presents with  . Emesis    (Consider location/radiation/quality/duration/timing/severity/associated sxs/prior treatment) HPI Patient is an 78 year old female with history of multiple medical problems who presents today after having 2 episodes of vomiting yesterday.  Her PCP referred her here for evaluation.  Patient currently endorses only very mild nausea.  She denies any abdominal pain, fever, constipation, diarrhea, urinary symptoms, cough, fever, chest pain, or neurologic symptoms.  The patient is alert and oriented x 3.  Patient has not vomited since arrival and reports she is feeling better.  There are no other associated or modifying factors.  Past Medical History  Diagnosis Date  . Hypertension   . UTI (urinary tract infection)   . SBO (small bowel obstruction)     Recurrent, prolonged hospitalization November, 2012  . Protein calorie malnutrition   . Hyperlipidemia   . DM (diabetes mellitus)   . COPD (chronic obstructive pulmonary disease)   . PVD (peripheral vascular disease)   . Colon cancer   . Bradycardia   . Tobacco abuse   . CAD (coronary artery disease)     Non-STEMI November, 2012,, Cardiac catheterization was attempted, despite significant efforts catheters could not be progressed to the coronary arteries ., Therefore medical therapy  . Carotid artery disease     Doppler  April 16, 2011,, total occlusion LICA, 60-79% R. ICA  . PAD (peripheral artery disease)     Suggestions significant stenosis distal aorta by CT angiogram  November, 2012  . Ejection fraction     EF 60%, echo, November, 2012,  . Mitral regurgitation     Moderate, echo, November, 2012  . Right ventricular dysfunction     Moderate, echo, November, 2012  . Edema     December, 2012  . Rash     Rash  probably related to hydralazine   December, 2012    Past Surgical History  Procedure Date  . Colostomy   . Abdominoperineal proctocolectomy     Family History  Problem Relation Age of Onset  . Coronary artery disease      History  Substance Use Topics  . Smoking status: Current Some Day Smoker -- 0.5 packs/day    Types: Cigarettes  . Smokeless tobacco: Not on file  . Alcohol Use: No    OB History    Grav Para Term Preterm Abortions TAB SAB Ect Mult Living                  Review of Systems  Constitutional: Negative.   HENT: Negative.   Eyes: Negative.   Respiratory: Negative.   Cardiovascular: Negative.   Gastrointestinal: Positive for nausea and vomiting.  Genitourinary: Negative.   Musculoskeletal: Negative.   Skin: Negative.   Neurological: Negative.   Hematological: Negative.   Psychiatric/Behavioral: Negative.   All other systems reviewed and are negative.    Allergies  Promethazine  Home Medications   Current Outpatient Rx  Name Route Sig Dispense Refill  . CLONIDINE HCL 0.2 MG PO TABS Oral Take 0.2 mg by mouth 2 (two) times daily.     Marland Kitchen DIGOXIN 0.125 MG PO TABS Oral Take 125 mcg by mouth daily.      . ENALAPRIL MALEATE 20 MG PO TABS Oral Take 40 mg by mouth daily.    . FENOFIBRATE 160 MG PO  TABS Oral Take 160 mg by mouth daily.      Marland Kitchen HYDROCHLOROTHIAZIDE 25 MG PO TABS Oral Take 25 mg by mouth daily.      Marland Kitchen METOPROLOL TARTRATE 50 MG PO TABS Oral Take 50 mg by mouth 2 (two) times daily.    Marland Kitchen PRAZOSIN HCL 2 MG PO CAPS Oral Take 2 mg by mouth 2 (two) times daily.      Marland Kitchen NITROGLYCERIN 0.4 MG SL SUBL Sublingual Place 0.4 mg under the tongue every 5 (five) minutes as needed. For chest pain    . ONDANSETRON HCL 4 MG PO TABS Oral Take 1 tablet (4 mg total) by mouth every 8 (eight) hours as needed for nausea. 12 tablet 0    BP 167/47  Pulse 74  Temp(Src) 98.1 F (36.7 C) (Oral)  Resp 16  SpO2 97%  Physical Exam  Nursing note and vitals reviewed. GEN: Well-developed, well-nourished  elderly female in no distress HEENT: Atraumatic, normocephalic.  EYES: PERRLA BL, no scleral icterus. NECK: Trachea midline, no meningismus CV: regular rate and rhythm. No murmurs, rubs, or gallops PULM: No respiratory distress.  No crackles, wheezes, or rales. GI: soft, non-tender. No guarding, rebound, or tenderness. + bowel sounds  GU: deferred Neuro: cranial nerves 2-12 intact, no abnormalities of strength or sensation, A and O x 3 MSK: Patient moves all 4 extremities symmetrically, no deformity, edema, or injury noted Skin: No rashes petechiae, purpura, or jaundice Psych: no abnormality of mood   ED Course  Procedures (including critical care time)   Date: 11/08/2011  Rate: 54  Rhythm: sinus bradycardia and premature atrial contractions (PAC)  QRS Axis: normal  Intervals: normal  ST/T Wave abnormalities: nonspecific T wave changes  Conduction Disutrbances:none  Narrative Interpretation:   Old EKG Reviewed: unchanged   Labs Reviewed  URINALYSIS, ROUTINE W REFLEX MICROSCOPIC - Abnormal; Notable for the following:    APPearance CLOUDY (*)    Bilirubin Urine SMALL (*)    Protein, ur 100 (*)    Leukocytes, UA SMALL (*)    All other components within normal limits  GLUCOSE, CAPILLARY - Abnormal; Notable for the following:    Glucose-Capillary 155 (*)    All other components within normal limits  CBC - Abnormal; Notable for the following:    WBC 11.5 (*)    Hemoglobin 11.5 (*)    All other components within normal limits  DIFFERENTIAL - Abnormal; Notable for the following:    Neutrophils Relative 87 (*)    Neutro Abs 10.0 (*)    Lymphocytes Relative 8 (*)    All other components within normal limits  COMPREHENSIVE METABOLIC PANEL - Abnormal; Notable for the following:    Glucose, Bld 153 (*)    BUN 32 (*)    Creatinine, Ser 1.23 (*)    Albumin 3.2 (*)    Alkaline Phosphatase 34 (*)    GFR calc non Af Amer 40 (*)    GFR calc Af Amer 46 (*)    All other components  within normal limits  URINE MICROSCOPIC-ADD ON - Abnormal; Notable for the following:    Squamous Epithelial / LPF FEW (*)    Casts HYALINE CASTS (*)    All other components within normal limits  LIPASE, BLOOD  LACTIC ACID, PLASMA  DIGOXIN LEVEL  POCT I-STAT TROPONIN I  POCT I-STAT TROPONIN I  URINE CULTURE   No results found.   1. Nausea and vomiting in adult   2. Mild dehydration  MDM  Patient was evaluated by myself.  She had an ECG as well as 0 and 3 hour troponins that were unremarkable.  The patient had no fevers or cough but had had nausea and vomiting.  She was given zofran IV.  Patient had no UTI, a very minimal leukocytosis and was completely without symptoms here in the ed.  She had a slightly elevated BUN/Cr ratio.  She received 1 L of NS IV bolus.  Patient was able to tolerate po and was felt safe for d/c home.  Patient was discharged with an Rx for zofran, recommendation to encourage po intake, and instructions to follow-up with PCP.  Patient and family were comfortable with plan for d/c.        Cyndra Numbers, MD 11/08/11 2233

## 2011-11-08 NOTE — Discharge Instructions (Signed)
Dehydration, Elderly  Dehydration is when you lose more fluids from the body than you take in. Vital organs such as the kidneys, brain, and heart cannot function without a proper amount of fluids and salt. Any loss of fluids from the body can cause dehydration.   Older adults are at a higher risk of dehydration than younger adults. As we age, our bodies are less able to conserve water and do not respond to temperature changes as well. Also, older adults do not become thirsty as easily or quickly. Because of this, older adults often do not realize they need to increase fluids to avoid dehydration.   CAUSES    Vomiting.   Diarrhea.   Excessive sweating.   Excessive urine output.   Fever.  SYMPTOMS   Mild dehydration   Thirst.   Dry lips.   Slightly dry mouth.  Moderate dehydration   Very dry mouth.   Sunken eyes.   Skin does not bounce back quickly when lightly pinched and released.   Dark urine and decreased urine production.   Decreased tear production.   Headache.  Severe dehydration   Very dry mouth.   Extreme thirst.   Rapid, weak pulse (more than 100 beats per minute at rest).   Cold hands and feet.   Not able to sweat in spite of heat.   Rapid breathing.   Blue lips.   Confusion and lethargy.   Difficulty being awakened.   Minimal urine production.   No tears.  DIAGNOSIS   Your caregiver will diagnose dehydration based on your symptoms and your exam. Blood and urine tests will help confirm the diagnosis. The diagnostic evaluation should also identify the cause of dehydration.  TREATMENT   Treatment of mild or moderate dehydration can often be done at home by increasing the amount of fluids that you drink. It is best to drink small amounts of fluid more often. Drinking too much at one time can make vomiting worse.   Severe dehydration needs to be treated at the hospital where you will probably be given intravenous (IV) fluids that contain water and electrolytes.  HOME CARE INSTRUCTIONS     Ask your caregiver about specific rehydration instructions.   Drink enough fluids to keep your urine clear or pale yellow.   Drink small amounts frequently if you have nausea and vomiting.   Eat as you normally do.   Avoid:   Foods or drinks high in sugar.   Carbonated drinks.   Juice.   Extremely hot or cold fluids.   Drinks with caffeine.   Fatty, greasy foods.   Alcohol.   Tobacco.   Overeating.   Gelatin desserts.   Wash your hands well to avoid spreading bacteria and viruses.   Only take over-the-counter or prescription medicines for pain, discomfort, or fever as directed by your caregiver.   Ask your caregiver if you should continue all prescribed and over-the-counter medicines.   Keep all follow-up appointments with your caregiver.  SEEK MEDICAL CARE IF:   You have abdominal pain and it increases or stays in one area (localizes).   You have a rash, stiff neck, or severe headache.   You are irritable, sleepy, or difficult to awaken.   You are weak, dizzy, or extremely thirsty.  SEEK IMMEDIATE MEDICAL CARE IF:    You are unable to keep fluids down, or you get worse despite treatment.   You have frequent episodes of vomiting or diarrhea.   You have blood or green   matter (bile) in your vomit.   You have blood in your stool or your stool looks black and tarry.   You have not urinated in 6 to 8 hours, or you have only urinated a small amount of very dark urine.   You have a fever.   You faint.  MAKE SURE YOU:    Understand these instructions.   Will watch your condition.   Will get help right away if you are not doing well or get worse.  Document Released: 08/19/2003 Document Revised: 05/18/2011 Document Reviewed: 01/16/2011  [employer] Patient Information 2012 [employer]

## 2011-11-10 LAB — URINE CULTURE

## 2011-11-22 ENCOUNTER — Ambulatory Visit: Payer: Medicare Other | Admitting: Cardiology

## 2011-11-28 ENCOUNTER — Encounter: Payer: Self-pay | Admitting: Cardiology

## 2011-11-28 ENCOUNTER — Ambulatory Visit (INDEPENDENT_AMBULATORY_CARE_PROVIDER_SITE_OTHER): Payer: Medicare Other | Admitting: Cardiology

## 2011-11-28 VITALS — BP 126/64 | HR 64 | Resp 24 | Ht 61.0 in | Wt 119.8 lb

## 2011-11-28 DIAGNOSIS — I2510 Atherosclerotic heart disease of native coronary artery without angina pectoris: Secondary | ICD-10-CM

## 2011-11-28 DIAGNOSIS — I779 Disorder of arteries and arterioles, unspecified: Secondary | ICD-10-CM

## 2011-11-28 DIAGNOSIS — I10 Essential (primary) hypertension: Secondary | ICD-10-CM

## 2011-11-28 NOTE — Progress Notes (Signed)
Patient ID: Monica Rhodes, female   DOB: 05-14-1928, 77 y.o.   MRN: [id number]   HPI The patient looks remarkably good today. She had been critically ill in the hospital with small bowel obstruction. This was December, 2012. She stabilized. She does have normal systolic function. She has a known STEMI in November, 2012 with her abdominal illness. Catheterization was attempted at great length but catheters could not be progressed to her coronary arteries. She has severe  carotid disease that is being followed very carefully.  Allergies  Allergen Reactions  . Promethazine Other (See Comments)    Unknown     Current Outpatient Prescriptions  Medication Sig Dispense Refill  . cloNIDine (CATAPRES) 0.2 MG tablet Take 0.2 mg by mouth 2 (two) times daily.       . digoxin (LANOXIN) 0.125 MG tablet Take 125 mcg by mouth daily.        . enalapril (VASOTEC) 20 MG tablet Take 40 mg by mouth daily.      . fenofibrate 160 MG tablet Take 160 mg by mouth daily.        . hydrochlorothiazide (HYDRODIURIL) 25 MG tablet Take 25 mg by mouth daily.        . metoprolol (LOPRESSOR) 50 MG tablet Take 50 mg by mouth 2 (two) times daily.      . nitroGLYCERIN (NITROSTAT) 0.4 MG SL tablet Place 0.4 mg under the tongue every 5 (five) minutes as needed. For chest pain      . prazosin (MINIPRESS) 2 MG capsule Take 2 mg by mouth 2 (two) times daily.          History   Social History  . Marital Status: Widowed    Spouse Name: N/A    Number of Children: N/A  . Years of Education: N/A   Occupational History  . Not on file.   Social History Main Topics  . Smoking status: Current Some Day Smoker -- 0.5 packs/day    Types: Cigarettes  . Smokeless tobacco: Not on file  . Alcohol Use: No  . Drug Use: No  . Sexually Active: Not on file   Other Topics Concern  . Not on file   Social History Narrative  . No narrative on file    Family History  Problem Relation Age of Onset  . Coronary artery disease       Past Medical History  Diagnosis Date  . Hypertension   . UTI (urinary tract infection)   . SBO (small bowel obstruction)     Recurrent, prolonged hospitalization November, 2012  . Protein calorie malnutrition   . Hyperlipidemia   . DM (diabetes mellitus)   . COPD (chronic obstructive pulmonary disease)   . PVD (peripheral vascular disease)   . Colon cancer   . Bradycardia   . Tobacco abuse   . CAD (coronary artery disease)     Non-STEMI November, 2012,, Cardiac catheterization was attempted, despite significant efforts catheters could not be progressed to the coronary arteries ., Therefore medical therapy  . Carotid artery disease     Doppler  April 16, 2011,, total occlusion LICA, 60-79% R. ICA  . PAD (peripheral artery disease)     Suggestions significant stenosis distal aorta by CT angiogram  November, 2012  . Ejection fraction     EF 60%, echo, November, 2012,  . Mitral regurgitation     Moderate, echo, November, 2012  . Right ventricular dysfunction     Moderate, echo, November, 2012  .  Edema     December, 2012  . Rash     Rash  probably related to hydralazine  December, 2012    Past Surgical History  Procedure Date  . Colostomy   . Abdominoperineal proctocolectomy     ROS   Patient denies fever, chills, headache, sweats, rash, change in vision, change in hearing, chest pain, cough, nausea vomiting, urinary symptoms. All other systems are reviewed and are negative.  PHYSICAL EXAM Patient is oriented to person time and place. Affect is normal. She is here with a family member. There is no jugulovenous distention. Lungs are clear. Respiratory effort is nonlabored. Cardiac exam reveals S1 and S2. There no clicks or significant murmurs. The abdomen is soft. She is no significant peripheral edema.  Filed Vitals:   11/28/11 1000  BP: 126/64  Pulse: 64  Resp: 24  Height: 5\' 1"  (1.549 m)  Weight: 119 lb 12.8 oz (54.341 kg)     ASSESSMENT & PLAN

## 2011-11-28 NOTE — Patient Instructions (Addendum)
Your physician wants you to follow-up in:  6 months. You will receive a reminder letter in the mail two months in advance. If you don't receive a letter, please call our office to schedule the follow-up appointment.   

## 2011-11-28 NOTE — Assessment & Plan Note (Signed)
Blood pressure is controlled. No change in therapy. 

## 2011-11-28 NOTE — Assessment & Plan Note (Signed)
Patient has severe carotid disease is being watched carefully. No change in therapy.

## 2011-11-28 NOTE — Assessment & Plan Note (Signed)
Coronary disease is stable. It has been noted that if catheterization were to be needed on an urgent basis the left radial could be tried.

## 2012-04-15 ENCOUNTER — Encounter (HOSPITAL_COMMUNITY): Payer: Self-pay | Admitting: Emergency Medicine

## 2012-04-15 ENCOUNTER — Emergency Department (HOSPITAL_COMMUNITY): Payer: Medicare Other

## 2012-04-15 ENCOUNTER — Inpatient Hospital Stay (HOSPITAL_COMMUNITY)
Admission: EM | Admit: 2012-04-15 | Discharge: 2012-04-18 | DRG: 389 | Disposition: A | Discharge status: Home / Self Care | Payer: Medicare Other | Attending: Internal Medicine | Admitting: Internal Medicine

## 2012-04-15 DIAGNOSIS — I2510 Atherosclerotic heart disease of native coronary artery without angina pectoris: Secondary | ICD-10-CM | POA: Diagnosis present

## 2012-04-15 DIAGNOSIS — R609 Edema, unspecified: Secondary | ICD-10-CM

## 2012-04-15 DIAGNOSIS — E785 Hyperlipidemia, unspecified: Secondary | ICD-10-CM | POA: Diagnosis present

## 2012-04-15 DIAGNOSIS — I340 Nonrheumatic mitral (valve) insufficiency: Secondary | ICD-10-CM

## 2012-04-15 DIAGNOSIS — IMO0002 Reserved for concepts with insufficient information to code with codable children: Secondary | ICD-10-CM

## 2012-04-15 DIAGNOSIS — I739 Peripheral vascular disease, unspecified: Secondary | ICD-10-CM | POA: Diagnosis present

## 2012-04-15 DIAGNOSIS — F17200 Nicotine dependence, unspecified, uncomplicated: Secondary | ICD-10-CM | POA: Diagnosis present

## 2012-04-15 DIAGNOSIS — J449 Chronic obstructive pulmonary disease, unspecified: Secondary | ICD-10-CM

## 2012-04-15 DIAGNOSIS — I10 Essential (primary) hypertension: Secondary | ICD-10-CM | POA: Diagnosis present

## 2012-04-15 DIAGNOSIS — Z85038 Personal history of other malignant neoplasm of large intestine: Secondary | ICD-10-CM

## 2012-04-15 DIAGNOSIS — R9430 Abnormal result of cardiovascular function study, unspecified: Secondary | ICD-10-CM

## 2012-04-15 DIAGNOSIS — Z23 Encounter for immunization: Secondary | ICD-10-CM

## 2012-04-15 DIAGNOSIS — I059 Rheumatic mitral valve disease, unspecified: Secondary | ICD-10-CM | POA: Diagnosis present

## 2012-04-15 DIAGNOSIS — K56609 Unspecified intestinal obstruction, unspecified as to partial versus complete obstruction: Principal | ICD-10-CM | POA: Diagnosis present

## 2012-04-15 DIAGNOSIS — J4489 Other specified chronic obstructive pulmonary disease: Secondary | ICD-10-CM | POA: Diagnosis present

## 2012-04-15 DIAGNOSIS — R21 Rash and other nonspecific skin eruption: Secondary | ICD-10-CM

## 2012-04-15 DIAGNOSIS — N390 Urinary tract infection, site not specified: Secondary | ICD-10-CM

## 2012-04-15 DIAGNOSIS — I519 Heart disease, unspecified: Secondary | ICD-10-CM

## 2012-04-15 DIAGNOSIS — Z933 Colostomy status: Secondary | ICD-10-CM

## 2012-04-15 DIAGNOSIS — Z79899 Other long term (current) drug therapy: Secondary | ICD-10-CM

## 2012-04-15 DIAGNOSIS — Z9889 Other specified postprocedural states: Secondary | ICD-10-CM

## 2012-04-15 DIAGNOSIS — E46 Unspecified protein-calorie malnutrition: Secondary | ICD-10-CM | POA: Diagnosis present

## 2012-04-15 DIAGNOSIS — E119 Type 2 diabetes mellitus without complications: Secondary | ICD-10-CM | POA: Diagnosis present

## 2012-04-15 DIAGNOSIS — I252 Old myocardial infarction: Secondary | ICD-10-CM

## 2012-04-15 DIAGNOSIS — Z9049 Acquired absence of other specified parts of digestive tract: Secondary | ICD-10-CM

## 2012-04-15 DIAGNOSIS — Z720 Tobacco use: Secondary | ICD-10-CM

## 2012-04-15 LAB — CBC
HCT: 39.2 % (ref 36.0–46.0)
Hemoglobin: 12.8 g/dL (ref 12.0–15.0)
MCH: 28.4 pg (ref 26.0–34.0)
MCV: 87.1 fL (ref 78.0–100.0)
Platelets: 211 10*3/uL (ref 150–400)
RBC: 4.5 MIL/uL (ref 3.87–5.11)

## 2012-04-15 LAB — COMPREHENSIVE METABOLIC PANEL
AST: 19 U/L (ref 0–37)
Albumin: 3.9 g/dL (ref 3.5–5.2)
Calcium: 10.3 mg/dL (ref 8.4–10.5)
Creatinine, Ser: 1.14 mg/dL — ABNORMAL HIGH (ref 0.50–1.10)
GFR calc non Af Amer: 44 mL/min — ABNORMAL LOW (ref >90)

## 2012-04-15 LAB — CBC WITH DIFFERENTIAL/PLATELET
Basophils Absolute: 0 10*3/uL (ref 0.0–0.1)
Basophils Relative: 0 % (ref 0–1)
Eosinophils Relative: 0 % (ref 0–5)
HCT: 41.7 % (ref 36.0–46.0)
MCH: 29.1 pg (ref 26.0–34.0)
MCHC: 33.6 g/dL (ref 30.0–36.0)
MCV: 86.7 fL (ref 78.0–100.0)
Monocytes Absolute: 1.1 10*3/uL — ABNORMAL HIGH (ref 0.1–1.0)
RDW: 13.1 % (ref 11.5–15.5)

## 2012-04-15 LAB — URINALYSIS, ROUTINE W REFLEX MICROSCOPIC
Glucose, UA: NEGATIVE mg/dL
Nitrite: NEGATIVE
Protein, ur: 100 mg/dL — AB
pH: 5 (ref 5.0–8.0)

## 2012-04-15 LAB — URINE MICROSCOPIC-ADD ON

## 2012-04-15 LAB — GLUCOSE, CAPILLARY: Glucose-Capillary: 143 mg/dL — ABNORMAL HIGH (ref 70–99)

## 2012-04-15 LAB — LIPASE, BLOOD: Lipase: 82 U/L — ABNORMAL HIGH (ref 11–59)

## 2012-04-15 MED ORDER — SODIUM CHLORIDE 0.9 % IJ SOLN
3.0000 mL | Freq: Two times a day (BID) | INTRAMUSCULAR | Status: DC
  Administered 2012-04-15 – 2012-04-18 (×3): 3 mL via INTRAVENOUS

## 2012-04-15 MED ORDER — PRAZOSIN HCL 2 MG PO CAPS
2.0000 mg | ORAL_CAPSULE | Freq: Two times a day (BID) | ORAL | Status: DC
  Administered 2012-04-15 – 2012-04-18 (×7): 2 mg via ORAL
  Filled 2012-04-15 (×8): qty 1

## 2012-04-15 MED ORDER — ONDANSETRON HCL 4 MG/2ML IJ SOLN
4.0000 mg | Freq: Four times a day (QID) | INTRAMUSCULAR | Status: DC | PRN
  Administered 2012-04-15: 4 mg via INTRAVENOUS
  Filled 2012-04-15 (×2): qty 2

## 2012-04-15 MED ORDER — ENALAPRIL MALEATE 20 MG PO TABS
40.0000 mg | ORAL_TABLET | Freq: Every day | ORAL | Status: DC
  Administered 2012-04-15 – 2012-04-18 (×4): 40 mg via ORAL
  Filled 2012-04-15 (×4): qty 2

## 2012-04-15 MED ORDER — METOPROLOL TARTRATE 50 MG PO TABS
50.0000 mg | ORAL_TABLET | Freq: Two times a day (BID) | ORAL | Status: DC
  Administered 2012-04-15 – 2012-04-18 (×7): 50 mg via ORAL
  Filled 2012-04-15 (×8): qty 1

## 2012-04-15 MED ORDER — ONDANSETRON HCL 4 MG PO TABS: 4.0000 mg | ORAL_TABLET | ORAL | Status: DC | PRN

## 2012-04-15 MED ORDER — CHLORHEXIDINE GLUCONATE 0.12 % MT SOLN
15.0000 mL | Freq: Two times a day (BID) | OROMUCOSAL | Status: DC
  Administered 2012-04-15 – 2012-04-18 (×7): 15 mL via OROMUCOSAL
  Filled 2012-04-15 (×8): qty 15

## 2012-04-15 MED ORDER — ONDANSETRON HCL 4 MG/2ML IJ SOLN
4.0000 mg | Freq: Four times a day (QID) | INTRAMUSCULAR | Status: DC | PRN
  Administered 2012-04-15: 4 mg via INTRAVENOUS

## 2012-04-15 MED ORDER — HYDRALAZINE HCL 20 MG/ML IJ SOLN
5.0000 mg | Freq: Four times a day (QID) | INTRAMUSCULAR | Status: DC | PRN
  Administered 2012-04-15 – 2012-04-16 (×2): 5 mg via INTRAVENOUS
  Filled 2012-04-15: qty 0.25

## 2012-04-15 MED ORDER — MORPHINE SULFATE 2 MG/ML IJ SOLN
1.0000 mg | INTRAMUSCULAR | Status: DC | PRN
  Administered 2012-04-15: 2 mg via INTRAVENOUS
  Filled 2012-04-15: qty 1

## 2012-04-15 MED ORDER — INFLUENZA VIRUS VACC SPLIT PF IM SUSP
0.5000 mL | INTRAMUSCULAR | Status: AC
  Administered 2012-04-16: 0.5 mL via INTRAMUSCULAR
  Filled 2012-04-15: qty 0.5

## 2012-04-15 MED ORDER — SODIUM CHLORIDE 0.9 % IV SOLN
Freq: Once | INTRAVENOUS | Status: AC
  Administered 2012-04-15: 03:00:00 via INTRAVENOUS

## 2012-04-15 MED ORDER — ACETAMINOPHEN 650 MG RE SUPP: 650.0000 mg | RECTAL | Status: DC | PRN

## 2012-04-15 MED ORDER — FENOFIBRATE 160 MG PO TABS
160.0000 mg | ORAL_TABLET | Freq: Every day | ORAL | Status: DC
  Filled 2012-04-15: qty 1

## 2012-04-15 MED ORDER — NITROGLYCERIN 0.4 MG SL SUBL: 0.4000 mg | SUBLINGUAL_TABLET | SUBLINGUAL | Status: DC | PRN

## 2012-04-15 MED ORDER — ONDANSETRON HCL 4 MG/2ML IJ SOLN
4.0000 mg | Freq: Once | INTRAMUSCULAR | Status: AC
Start: 2012-04-15 — End: 2012-04-15
  Administered 2012-04-15: 4 mg via INTRAVENOUS

## 2012-04-15 MED ORDER — HEPARIN SODIUM (PORCINE) 5000 UNIT/ML IJ SOLN
5000.0000 [IU] | Freq: Three times a day (TID) | INTRAMUSCULAR | Status: DC
  Administered 2012-04-15 – 2012-04-17 (×7): 5000 [IU] via SUBCUTANEOUS
  Filled 2012-04-15 (×10): qty 1

## 2012-04-15 MED ORDER — HYDROCHLOROTHIAZIDE 25 MG PO TABS
25.0000 mg | ORAL_TABLET | Freq: Every day | ORAL | Status: DC
  Administered 2012-04-15 – 2012-04-17 (×3): 25 mg via ORAL
  Filled 2012-04-15 (×3): qty 1

## 2012-04-15 MED ORDER — ONDANSETRON HCL 4 MG/2ML IJ SOLN
4.0000 mg | INTRAMUSCULAR | Status: DC | PRN
  Administered 2012-04-15 – 2012-04-17 (×6): 4 mg via INTRAVENOUS
  Filled 2012-04-15 (×7): qty 2

## 2012-04-15 MED ORDER — SODIUM CHLORIDE 0.9 % IV SOLN
INTRAVENOUS | Status: DC
  Administered 2012-04-15 (×2): via INTRAVENOUS

## 2012-04-15 MED ORDER — ONDANSETRON HCL 4 MG/2ML IJ SOLN
4.0000 mg | Freq: Once | INTRAMUSCULAR | Status: AC
  Administered 2012-04-15: 4 mg via INTRAVENOUS
  Filled 2012-04-15: qty 2

## 2012-04-15 MED ORDER — CLONIDINE HCL 0.2 MG PO TABS
0.2000 mg | ORAL_TABLET | Freq: Two times a day (BID) | ORAL | Status: DC
  Administered 2012-04-15 – 2012-04-17 (×5): 0.2 mg via ORAL
  Filled 2012-04-15 (×7): qty 1

## 2012-04-15 MED ORDER — ACETAMINOPHEN 325 MG PO TABS: 650.0000 mg | ORAL_TABLET | ORAL | Status: DC | PRN

## 2012-04-15 MED ORDER — MORPHINE SULFATE 4 MG/ML IJ SOLN
4.0000 mg | Freq: Once | INTRAMUSCULAR | Status: AC
  Administered 2012-04-15: 4 mg via INTRAVENOUS
  Filled 2012-04-15: qty 1

## 2012-04-15 MED ORDER — DIGOXIN 125 MCG PO TABS
125.0000 ug | ORAL_TABLET | Freq: Every day | ORAL | Status: DC
  Administered 2012-04-15 – 2012-04-18 (×4): 125 ug via ORAL
  Filled 2012-04-15 (×5): qty 1

## 2012-04-15 MED ORDER — ONDANSETRON HCL 4 MG PO TABS: 4.0000 mg | ORAL_TABLET | Freq: Four times a day (QID) | ORAL | Status: DC | PRN

## 2012-04-15 NOTE — ED Notes (Signed)
Back from xray, no changes, alert, NAD, calm, interactive.  

## 2012-04-15 NOTE — ED Notes (Signed)
Up on monitor to floor with RN

## 2012-04-15 NOTE — Progress Notes (Addendum)
INITIAL ADULT NUTRITION ASSESSMENT Date: 04/15/2012   Time: 3:14 PM  INTERVENTION:  Advance diet as medically appropriate RD to follow for nutrition care plan, make recommendations/add interventions accordingly  DOCUMENTATION CODES Per approved criteria  -Not Applicable   Reason for Assessment: Malnutrition Screening Tool Report  ASSESSMENT: Female 78 y.o.  Dx: SBO (small bowel obstruction)  Hx:  Past Medical History  Diagnosis Date  . Hypertension   . UTI (urinary tract infection)   . SBO (small bowel obstruction)     Recurrent, prolonged hospitalization November, 2012  . Protein calorie malnutrition   . Hyperlipidemia   . DM (diabetes mellitus)   . COPD (chronic obstructive pulmonary disease)   . PVD (peripheral vascular disease)   . Colon cancer   . Bradycardia   . Tobacco abuse   . CAD (coronary artery disease)     Non-STEMI November, 2012,, Cardiac catheterization was attempted, despite significant efforts catheters could not be progressed to the coronary arteries ., Therefore medical therapy  . Carotid artery disease     Doppler  April 16, 2011,, total occlusion LICA, 60-79% R. ICA  . PAD (peripheral artery disease)     Suggestions significant stenosis distal aorta by CT angiogram  November, 2012  . Ejection fraction     EF 60%, echo, November, 2012,  . Mitral regurgitation     Moderate, echo, November, 2012  . Right ventricular dysfunction     Moderate, echo, November, 2012  . Edema     December, 2012  . Rash     Rash  probably related to hydralazine  December, 2012    Related Meds:     . [COMPLETED] sodium chloride   Intravenous Once  . chlorhexidine  15 mL Mouth/Throat BID  . cloNIDine  0.2 mg Oral BID  . digoxin  125 mcg Oral Daily  . enalapril  40 mg Oral Daily  . heparin  5,000 Units Subcutaneous Q8H  . hydrochlorothiazide  25 mg Oral Daily  . influenza  inactive virus vaccine  0.5 mL Intramuscular Tomorrow-1000  . metoprolol  50 mg Oral  BID  . [COMPLETED]  morphine injection  4 mg Intravenous Once  . [COMPLETED] ondansetron (ZOFRAN) IV  4 mg Intravenous Once  . [COMPLETED] ondansetron  4 mg Intravenous Once  . prazosin  2 mg Oral BID  . sodium chloride  3 mL Intravenous Q12H  . [DISCONTINUED] fenofibrate  160 mg Oral Daily    Ht: 5\' 2"  (157.5 cm)  Wt: 119 lb 7.8 oz (54.2 kg) (bs)  Wt Readings from Last 10 Encounters:  04/15/12 119 lb 7.8 oz (54.2 kg)  11/28/11 119 lb 12.8 oz (54.341 kg)  08/01/11 119 lb (53.978 kg)  06/09/11 125 lb (56.7 kg)  04/27/11 135 lb 12.9 oz (61.6 kg)  04/27/11 135 lb 12.9 oz (61.6 kg)    Ideal Wt: 50 kg % Ideal Wt: 108%  Usual Wt: unknown % Usual Wt: ---  Body mass index is 21.85 kg/(m^2).  Food/Nutrition Related Hx: recent weight lost without trying (patient unsure) per admission nutrition screen  Labs:  CMP     Component Value Date/Time   NA 138 04/15/2012 0257   K 4.3 04/15/2012 0257   CL 99 04/15/2012 0257   CO2 30 04/15/2012 0257   GLUCOSE 149* 04/15/2012 0257   BUN 38* 04/15/2012 0257   CREATININE 1.15* 04/15/2012 0451   CALCIUM 10.3 04/15/2012 0257   PROT 7.5 04/15/2012 0257   ALBUMIN 3.9 04/15/2012 0257  AST 19 04/15/2012 0257   ALT <5 04/15/2012 0257   ALKPHOS 42 04/15/2012 0257   BILITOT 0.3 04/15/2012 0257   GFRNONAA 43* 04/15/2012 0451   GFRAA 50* 04/15/2012 0451     Intake/Output Summary (Last 24 hours) at 04/15/12 1529 Last data filed at 04/15/12 1305  Gross per 24 hour  Intake   1000 ml  Output    300 ml  Net    700 ml    CBG (last 3)   Basename 04/15/12 0611  GLUCAP 143*    Diet Order: NPO  Supplements/Tube Feeding: N/A  IVF:    sodium chloride Last Rate: 75 mL/hr at 04/15/12 0515    Estimated Nutritional Needs:   Kcal: 1400-1600 Protein: 65-75 gm Fluid: > 1.5 L  Patient admitted with nausea & vomiting; per H&P over the month of October she has had numerous episodes of nausea; X-ray of her abdomen confirmed presence of multiple air-fluid  levels concerning for SBO; + colostomy bag; plan is for conservative management at this time.  RD unable to obtain much nutrition hx from patient; was able to state she was eating "pretty good" PTA; patient is unsure if she's lost weight; RD spoke briefly with patient's daughter Dois Davenport via telephone -- she expressed concern of patient receiving an enema during hospitalization -- RD notified RN -- RN aware of family's request -- sticky note to MD posted per RN.  NUTRITION DIAGNOSIS: -Inadequate oral intake (NI-2.1).  Status: Ongoing  RELATED TO: altered GI function  AS EVIDENCE BY: NPO status  MONITORING/EVALUATION(Goals): Goal: Oral intake vs nutrition support to meet >/= 90% of estimated nutrition needs Monitor: PO diet advancement, nutrition support initiation, weight, labs, I/O's  EDUCATION NEEDS: -No education needs identified at this time  Kirkland Hun, RD, LDN Pager #: [phone] After-Hours Pager #: [phone]

## 2012-04-15 NOTE — ED Notes (Signed)
IVF locked off for transport, "feels better", reports decreased pain and nausea. Speaking with son at [facility].

## 2012-04-15 NOTE — Progress Notes (Signed)
[facility] PROGRESS NOTE  Monica Rhodes BJY:[id number] DOB: 01-11-1928 DOA: 04/15/2012 PCP: Geraldo Pitter, MD  Assessment/Plan: 1. 1. SBO:  - bowel rest - iv fluids - one episode of vomiting.  - CT ABD and pelvis without contrast ordered  - pt refusing NG Tube insertion. - Surgery consult called.  2. Hypertension : better controlled .   3. DVT prophylaxis.     Code Status: full code Family Communication: sons at bedside Disposition Plan: 1- 2 days.   Consultants:  Consulted  CCS HPI/Subjective: nauseated  Objective: Filed Vitals:   04/15/12 1143 04/15/12 1144 04/15/12 1333 04/15/12 1403  BP: 186/62 186/62 193/89 156/49  Pulse: 84 84 100   Temp:    98.4 F (36.9 C)  TempSrc:    Oral  Resp:    18  Height:      Weight:      SpO2:    96%    Intake/Output Summary (Last 24 hours) at 04/15/12 1805 Last data filed at 04/15/12 1305  Gross per 24 hour  Intake   1000 ml  Output    300 ml  Net    700 ml   Filed Weights   04/15/12 0623  Weight: 54.2 kg (119 lb 7.8 oz)    Exam:   General:  Alert afebrile comfortable, but confused  Cardiovascular: s1s2  Respiratory: CTAB, no wheezing   Abdomen: soft, NT mildly distended, bowel sounds heard    Data Reviewed: Basic Metabolic Panel:  Lab 04/15/12 0865 04/15/12 0257  NA -- 138  K -- 4.3  CL -- 99  CO2 -- 30  GLUCOSE -- 149*  BUN -- 38*  CREATININE 1.15* 1.14*  CALCIUM -- 10.3  MG -- --  PHOS -- --   Liver Function Tests:  Lab 04/15/12 0257  AST 19  ALT <5  ALKPHOS 42  BILITOT 0.3  PROT 7.5  ALBUMIN 3.9    Lab 04/15/12 0257  LIPASE 82*  AMYLASE --   No results found for this basename: AMMONIA:5 in the last 168 hours CBC:  Lab 04/15/12 0451 04/15/12 0257  WBC 12.9* 13.3*  NEUTROABS -- 10.5*  HGB 12.8 14.0  HCT 39.2 41.7  MCV 87.1 86.7  PLT 211 224   Cardiac Enzymes: No results found for this basename: CKTOTAL:5,CKMB:5,CKMBINDEX:5,TROPONINI:5 in the last 168  hours BNP (last 3 results) No results found for this basename: PROBNP:3 in the last 8760 hours CBG:  Lab 04/15/12 0611  GLUCAP 143*    No results found for this or any previous visit (from the past 240 hour(s)).   Studies: Dg Abd Acute W/chest  04/15/2012  *RADIOLOGY REPORT*  Clinical Data: Abdominal pain, nausea and vomiting.  ACUTE ABDOMEN SERIES (ABDOMEN 2 VIEW & CHEST 1 VIEW)  Comparison: Chest radiograph performed 04/17/2011, and abdominal radiograph performed 04/16/2011  Findings: The lungs are well-aerated and clear.  There is no evidence of focal opacification, pleural effusion or pneumothorax. The cardiomediastinal silhouette is mildly enlarged.  Scattered prominent air-fluid levels are noted within the small bowel, with distension of small bowel loops to 4.2 cm in maximal diameter, concerning for bowel obstruction.  Stool is noted within the visualized portions of the colon; the stomach is partially filled with fluid and air.  No free intra-abdominal air is identified on the provided upright view.  No acute osseous abnormalities are seen; the sacroiliac joints are unremarkable in appearance.  Scattered clips are noted within the abdomen and pelvis.  IMPRESSION:  1.  Prominent  air-fluid levels within the small bowel, with distension of small bowel loops to 4.2 cm in maximal diameter, concerning for bowel obstruction.  Stool noted within the colon; no free intra-abdominal air seen. 2.  Lungs clear; mild cardiomegaly noted.  These results were called by telephone on 04/15/2012 at 03:51 a.m. to Dr. Dione Booze, who verbally acknowledged these results.   Original Report Authenticated By: Tonia Ghent, M.D.     Scheduled Meds:   . [COMPLETED] sodium chloride   Intravenous Once  . chlorhexidine  15 mL Mouth/Throat BID  . cloNIDine  0.2 mg Oral BID  . digoxin  125 mcg Oral Daily  . enalapril  40 mg Oral Daily  . heparin  5,000 Units Subcutaneous Q8H  . hydrochlorothiazide  25 mg Oral Daily   . influenza  inactive virus vaccine  0.5 mL Intramuscular Tomorrow-1000  . metoprolol  50 mg Oral BID  . [COMPLETED]  morphine injection  4 mg Intravenous Once  . [COMPLETED] ondansetron (ZOFRAN) IV  4 mg Intravenous Once  . [COMPLETED] ondansetron  4 mg Intravenous Once  . prazosin  2 mg Oral BID  . sodium chloride  3 mL Intravenous Q12H  . [DISCONTINUED] fenofibrate  160 mg Oral Daily   Continuous Infusions:   . sodium chloride 75 mL/hr at 04/15/12 1612    Principal Problem:  *SBO (small bowel obstruction) Active Problems:  History of colectomy  Hypertension  CAD (coronary artery disease)  Hyperlipidemia  DM (diabetes mellitus)        Monica Rhodes  [facility] Pager [phone]. If 8PM-8AM, please contact night-coverage at [email or web address], password [facility] 04/15/2012, 6:05 PM  LOS: 0 days

## 2012-04-15 NOTE — ED Notes (Signed)
Pt c/o N/V abd pain onset x 12hrs hx SBO. Last BM today

## 2012-04-15 NOTE — ED Notes (Signed)
To x-ray

## 2012-04-15 NOTE — Progress Notes (Signed)
UR done. 

## 2012-04-15 NOTE — Consult Note (Signed)
Reason for Consult:Small bowel obstruction; colostomy Referring Physician: Dr. Blake Divine - [facility]  Monica Rhodes is an 78 y.o. female.  HPI: This patient is s/p abdominoperineal resection in 2001 by Dr. Luan Pulling for rectal cancer and s/p repair of parastomal hernia in 2007 by Dr. Johna Sheriff.  She has had multiple previous admissions for small bowel obstruction, the latest in November 2012.  At that time, she was also found to have a non-STEMI.  Her obstruction resolved without surgery after irrigating the colostomy.    She presents with several days of mild abdominal pain and nausea, with decreased ostomy output.  She has had one episode of vomiting.  Plain films showed dilated loops of small bowel and stool in the colon.  We are consulted for the bowel obstruction.  A CT scan of the abdomen and pelvis is pending.  At baseline, she lives with her son and is reasonably active.  She works in the garden and is able to take care of herself, including some driving.   Past Medical History  Diagnosis Date  . Hypertension   . UTI (urinary tract infection)   . SBO (small bowel obstruction)     Recurrent, prolonged hospitalization November, 2012  . Protein calorie malnutrition   . Hyperlipidemia   . DM (diabetes mellitus)   . COPD (chronic obstructive pulmonary disease)   . PVD (peripheral vascular disease)   . Colon cancer   . Bradycardia   . Tobacco abuse   . CAD (coronary artery disease)     Non-STEMI November, 2012,, Cardiac catheterization was attempted, despite significant efforts catheters could not be progressed to the coronary arteries ., Therefore medical therapy  . Carotid artery disease     Doppler  April 16, 2011,, total occlusion LICA, 60-79% R. ICA  . PAD (peripheral artery disease)     Suggestions significant stenosis distal aorta by CT angiogram  November, 2012  . Ejection fraction     EF 60%, echo, November, 2012,  . Mitral regurgitation     Moderate, echo, November, 2012  .  Right ventricular dysfunction     Moderate, echo, November, 2012  . Edema     December, 2012  . Rash     Rash  probably related to hydralazine  December, 2012    Past Surgical History  Procedure Date  . Colostomy   . Abdominoperineal proctocolectomy     Family History  Problem Relation Age of Onset  . Coronary artery disease      Social History:  reports that she has been smoking Cigarettes.  She has been smoking about .5 packs per day. She does not have any smokeless tobacco history on file. She reports that she does not drink alcohol or use illicit drugs.  Allergies:  Allergies  Allergen Reactions  . Promethazine Other (See Comments)    Unknown     Medications:  Prior to Admission:  Prescriptions prior to admission  Medication Sig Dispense Refill  . cloNIDine (CATAPRES) 0.2 MG tablet Take 0.2 mg by mouth 2 (two) times daily.       . digoxin (LANOXIN) 0.125 MG tablet Take 125 mcg by mouth daily.        . enalapril (VASOTEC) 20 MG tablet Take 40 mg by mouth daily.      . fenofibrate 160 MG tablet Take 160 mg by mouth daily.        . hydrochlorothiazide (HYDRODIURIL) 25 MG tablet Take 25 mg by mouth daily.        Marland Kitchen  metoprolol (LOPRESSOR) 50 MG tablet Take 50 mg by mouth 2 (two) times daily.      . nitroGLYCERIN (NITROSTAT) 0.4 MG SL tablet Place 0.4 mg under the tongue every 5 (five) minutes as needed. For chest pain      . ondansetron (ZOFRAN) 8 MG tablet Take 8 mg by mouth every 8 (eight) hours as needed. For nausea      . prazosin (MINIPRESS) 2 MG capsule Take 2 mg by mouth 2 (two) times daily.          Results for orders placed during the hospital encounter of 04/15/12 (from the past 48 hour(s))  CBC WITH DIFFERENTIAL     Status: Abnormal   Collection Time   04/15/12  2:57 AM      Component Value Range Comment   WBC 13.3 (*) 4.0 - 10.5 K/uL    RBC 4.81  3.87 - 5.11 MIL/uL    Hemoglobin 14.0  12.0 - 15.0 g/dL    HCT 11.9  14.7 - 82.9 %    MCV 86.7  78.0 - 100.0  fL    MCH 29.1  26.0 - 34.0 pg    MCHC 33.6  30.0 - 36.0 g/dL    RDW 56.2  13.0 - 86.5 %    Platelets 224  150 - 400 K/uL    Neutrophils Relative 79 (*) 43 - 77 %    Neutro Abs 10.5 (*) 1.7 - 7.7 K/uL    Lymphocytes Relative 12  12 - 46 %    Lymphs Abs 1.6  0.7 - 4.0 K/uL    Monocytes Relative 8  3 - 12 %    Monocytes Absolute 1.1 (*) 0.1 - 1.0 K/uL    Eosinophils Relative 0  0 - 5 %    Eosinophils Absolute 0.1  0.0 - 0.7 K/uL    Basophils Relative 0  0 - 1 %    Basophils Absolute 0.0  0.0 - 0.1 K/uL   COMPREHENSIVE METABOLIC PANEL     Status: Abnormal   Collection Time   04/15/12  2:57 AM      Component Value Range Comment   Sodium 138  135 - 145 mEq/L    Potassium 4.3  3.5 - 5.1 mEq/L    Chloride 99  96 - 112 mEq/L    CO2 30  19 - 32 mEq/L    Glucose, Bld 149 (*) 70 - 99 mg/dL    BUN 38 (*) 6 - 23 mg/dL    Creatinine, Ser 7.84 (*) 0.50 - 1.10 mg/dL    Calcium 69.6  8.4 - 10.5 mg/dL    Total Protein 7.5  6.0 - 8.3 g/dL    Albumin 3.9  3.5 - 5.2 g/dL    AST 19  0 - 37 U/L    ALT <5  0 - 35 U/L    Alkaline Phosphatase 42  39 - 117 U/L    Total Bilirubin 0.3  0.3 - 1.2 mg/dL    GFR calc non Af Amer 44 (*) >90 mL/min    GFR calc Af Amer 50 (*) >90 mL/min   LIPASE, BLOOD     Status: Abnormal   Collection Time   04/15/12  2:57 AM      Component Value Range Comment   Lipase 82 (*) 11 - 59 U/L   URINALYSIS, ROUTINE W REFLEX MICROSCOPIC     Status: Abnormal   Collection Time   04/15/12  3:59 AM  Component Value Range Comment   Color, Urine YELLOW  YELLOW    APPearance CLOUDY (*) CLEAR    Specific Gravity, Urine 1.023  1.005 - 1.030    pH 5.0  5.0 - 8.0    Glucose, UA NEGATIVE  NEGATIVE mg/dL    Hgb urine dipstick NEGATIVE  NEGATIVE    Bilirubin Urine NEGATIVE  NEGATIVE    Ketones, ur NEGATIVE  NEGATIVE mg/dL    Protein, ur 161 (*) NEGATIVE mg/dL    Urobilinogen, UA 0.2  0.0 - 1.0 mg/dL    Nitrite NEGATIVE  NEGATIVE    Leukocytes, UA SMALL (*) NEGATIVE   URINE  MICROSCOPIC-ADD ON     Status: Abnormal   Collection Time   04/15/12  3:59 AM      Component Value Range Comment   Squamous Epithelial / LPF FEW (*) RARE    WBC, UA 7-10  <3 WBC/hpf    RBC / HPF 0-2  <3 RBC/hpf    Bacteria, UA FEW (*) RARE    Casts HYALINE CASTS (*) NEGATIVE   CBC     Status: Abnormal   Collection Time   04/15/12  4:51 AM      Component Value Range Comment   WBC 12.9 (*) 4.0 - 10.5 K/uL    RBC 4.50  3.87 - 5.11 MIL/uL    Hemoglobin 12.8  12.0 - 15.0 g/dL    HCT 09.6  04.5 - 40.9 %    MCV 87.1  78.0 - 100.0 fL    MCH 28.4  26.0 - 34.0 pg    MCHC 32.7  30.0 - 36.0 g/dL    RDW 81.1  91.4 - 78.2 %    Platelets 211  150 - 400 K/uL   CREATININE, SERUM     Status: Abnormal   Collection Time   04/15/12  4:51 AM      Component Value Range Comment   Creatinine, Ser 1.15 (*) 0.50 - 1.10 mg/dL    GFR calc non Af Amer 43 (*) >90 mL/min    GFR calc Af Amer 50 (*) >90 mL/min   GLUCOSE, CAPILLARY     Status: Abnormal   Collection Time   04/15/12  6:11 AM      Component Value Range Comment   Glucose-Capillary 143 (*) 70 - 99 mg/dL     Dg Abd Acute W/chest  04/15/2012  *RADIOLOGY REPORT*  Clinical Data: Abdominal pain, nausea and vomiting.  ACUTE ABDOMEN SERIES (ABDOMEN 2 VIEW & CHEST 1 VIEW)  Comparison: Chest radiograph performed 04/17/2011, and abdominal radiograph performed 04/16/2011  Findings: The lungs are well-aerated and clear.  There is no evidence of focal opacification, pleural effusion or pneumothorax. The cardiomediastinal silhouette is mildly enlarged.  Scattered prominent air-fluid levels are noted within the small bowel, with distension of small bowel loops to 4.2 cm in maximal diameter, concerning for bowel obstruction.  Stool is noted within the visualized portions of the colon; the stomach is partially filled with fluid and air.  No free intra-abdominal air is identified on the provided upright view.  No acute osseous abnormalities are seen; the sacroiliac joints  are unremarkable in appearance.  Scattered clips are noted within the abdomen and pelvis.  IMPRESSION:  1.  Prominent air-fluid levels within the small bowel, with distension of small bowel loops to 4.2 cm in maximal diameter, concerning for bowel obstruction.  Stool noted within the colon; no free intra-abdominal air seen. 2.  Lungs clear; mild cardiomegaly noted.  These results were called by telephone on 04/15/2012 at 03:51 a.m. to Dr. Dione Booze, who verbally acknowledged these results.   Original Report Authenticated By: Tonia Ghent, M.D.     Review of Systems  Eyes: Negative.   Gastrointestinal: Positive for nausea, vomiting and abdominal pain.  Neurological: Negative.   Endo/Heme/Allergies: Negative.    Blood pressure 156/49, pulse 100, temperature 98.4 F (36.9 C), temperature source Oral, resp. rate 18, height 5\' 2"  (1.575 m), weight 119 lb 7.8 oz (54.2 kg), SpO2 96.00%. Physical Exam Elderly female in NAD Awake, alert Hard of hearing Lungs - CTA B CV - RRR Abd - mildly distended; soft; firm palpable mass in upper midline - reduces; ostomy - pink, viable; no firm stool in ostomy down to the level of the fascia No significant stool output Extremities - trace edema  Assessment/Plan: Partial small bowel obstruction - possibly secondary to constipation with ostomy obstruction from hard stool vs. Possible ventral/ parastomal hernia  Recs; 1. Agree with CT scan of the abd/ pelvis - this will help define any ventral/ parastomal hernia. 2.  Irrigate colostomy  Khamani Fairley K. 04/15/2012, 6:56 PM

## 2012-04-15 NOTE — ED Provider Notes (Signed)
History     CSN: [id number]  Arrival date & time 04/15/12  0221   First MD Initiated Contact with Patient 04/15/12 0231      Chief Complaint  Patient presents with  . Abdominal Pain    hx bowell obstruction  . Nausea  . Emesis    (Consider location/radiation/quality/duration/timing/severity/associated sxs/prior treatment) Patient is a 78 y.o. female presenting with abdominal pain and vomiting. The history is provided by the patient.  Abdominal Pain The primary symptoms of the illness include abdominal pain and vomiting.  Emesis  Associated symptoms include abdominal pain.  She has a history of a colostomy and recurrent small bowel obstructions. She started having crampy mid abdominal pain yesterday afternoon and does develop nausea with vomiting. Her colostomy has been draining, but not as much is normal. She is a relatively poor historian and is unable to put a number on her pain although she states she's not having pain at this time. She is continuing to have nausea. She denies fever, chills, sweats.  Past Medical History  Diagnosis Date  . Hypertension   . UTI (urinary tract infection)   . SBO (small bowel obstruction)     Recurrent, prolonged hospitalization November, 2012  . Protein calorie malnutrition   . Hyperlipidemia   . DM (diabetes mellitus)   . COPD (chronic obstructive pulmonary disease)   . PVD (peripheral vascular disease)   . Colon cancer   . Bradycardia   . Tobacco abuse   . CAD (coronary artery disease)     Non-STEMI November, 2012,, Cardiac catheterization was attempted, despite significant efforts catheters could not be progressed to the coronary arteries ., Therefore medical therapy  . Carotid artery disease     Doppler  April 16, 2011,, total occlusion LICA, 60-79% R. ICA  . PAD (peripheral artery disease)     Suggestions significant stenosis distal aorta by CT angiogram  November, 2012  . Ejection fraction     EF 60%, echo, November, 2012,  .  Mitral regurgitation     Moderate, echo, November, 2012  . Right ventricular dysfunction     Moderate, echo, November, 2012  . Edema     December, 2012  . Rash     Rash  probably related to hydralazine  December, 2012    Past Surgical History  Procedure Date  . Colostomy   . Abdominoperineal proctocolectomy     Family History  Problem Relation Age of Onset  . Coronary artery disease      History  Substance Use Topics  . Smoking status: Current Some Day Smoker -- 0.5 packs/day    Types: Cigarettes  . Smokeless tobacco: Not on file  . Alcohol Use: No    OB History    Grav Para Term Preterm Abortions TAB SAB Ect Mult Living                  Review of Systems  Gastrointestinal: Positive for vomiting and abdominal pain.  All other systems reviewed and are negative.    Allergies  Promethazine  Home Medications   Current Outpatient Rx  Name  Route  Sig  Dispense  Refill  . ONDANSETRON HCL 8 MG PO TABS   Oral   Take by mouth every 8 (eight) hours as needed.         Marland Kitchen CLONIDINE HCL 0.2 MG PO TABS   Oral   Take 0.2 mg by mouth 2 (two) times daily.          Marland Kitchen  DIGOXIN 0.125 MG PO TABS   Oral   Take 125 mcg by mouth daily.           . ENALAPRIL MALEATE 20 MG PO TABS   Oral   Take 40 mg by mouth daily.         . FENOFIBRATE 160 MG PO TABS   Oral   Take 160 mg by mouth daily.           Marland Kitchen HYDROCHLOROTHIAZIDE 25 MG PO TABS   Oral   Take 25 mg by mouth daily.           Marland Kitchen METOPROLOL TARTRATE 50 MG PO TABS   Oral   Take 50 mg by mouth 2 (two) times daily.         Marland Kitchen NITROGLYCERIN 0.4 MG SL SUBL   Sublingual   Place 0.4 mg under the tongue every 5 (five) minutes as needed. For chest pain         . PRAZOSIN HCL 2 MG PO CAPS   Oral   Take 2 mg by mouth 2 (two) times daily.             BP 218/66  Pulse 66  Resp 16  SpO2 96%  Physical Exam  Nursing note and vitals reviewed. 78 year old female, resting comfortably and in no acute  distress. Vital signs are significant for hypertension with blood pressure 218/66. Oxygen saturation is 96%, which is normal. Head is normocephalic and atraumatic. PERRLA, EOMI. Oropharynx is clear. Neck is nontender and supple without adenopathy or JVD. Back is nontender and there is no CVA tenderness. Lungs are clear without rales, wheezes, or rhonchi. Chest is nontender. Heart has regular rate and rhythm without murmur. Abdomen is soft, flat, without masses or hepatosplenomegaly. There is mild, diffuse abdominal tenderness without rebound or guarding. Colostomy is present in the lower abdomen. Bowel sounds are decreased.. Extremities have no cyanosis or edema, full range of motion is present. Skin is warm and dry without rash. Neurologic: Mental status is normal, cranial nerves are intact, there are no motor or sensory deficits.   ED Course  Procedures (including critical care time)  Results for orders placed during the hospital encounter of 04/15/12  CBC WITH DIFFERENTIAL      Component Value Range   WBC 13.3 (*) 4.0 - 10.5 K/uL   RBC 4.81  3.87 - 5.11 MIL/uL   Hemoglobin 14.0  12.0 - 15.0 g/dL   HCT 40.9  81.1 - 91.4 %   MCV 86.7  78.0 - 100.0 fL   MCH 29.1  26.0 - 34.0 pg   MCHC 33.6  30.0 - 36.0 g/dL   RDW 78.2  95.6 - 21.3 %   Platelets 224  150 - 400 K/uL   Neutrophils Relative 79 (*) 43 - 77 %   Neutro Abs 10.5 (*) 1.7 - 7.7 K/uL   Lymphocytes Relative 12  12 - 46 %   Lymphs Abs 1.6  0.7 - 4.0 K/uL   Monocytes Relative 8  3 - 12 %   Monocytes Absolute 1.1 (*) 0.1 - 1.0 K/uL   Eosinophils Relative 0  0 - 5 %   Eosinophils Absolute 0.1  0.0 - 0.7 K/uL   Basophils Relative 0  0 - 1 %   Basophils Absolute 0.0  0.0 - 0.1 K/uL  COMPREHENSIVE METABOLIC PANEL      Component Value Range   Sodium 138  135 - 145 mEq/L   Potassium  4.3  3.5 - 5.1 mEq/L   Chloride 99  96 - 112 mEq/L   CO2 30  19 - 32 mEq/L   Glucose, Bld 149 (*) 70 - 99 mg/dL   BUN 38 (*) 6 - 23 mg/dL    Creatinine, Ser 1.61 (*) 0.50 - 1.10 mg/dL   Calcium 09.6  8.4 - 04.5 mg/dL   Total Protein 7.5  6.0 - 8.3 g/dL   Albumin 3.9  3.5 - 5.2 g/dL   AST 19  0 - 37 U/L   ALT <5  0 - 35 U/L   Alkaline Phosphatase 42  39 - 117 U/L   Total Bilirubin 0.3  0.3 - 1.2 mg/dL   GFR calc non Af Amer 44 (*) >90 mL/min   GFR calc Af Amer 50 (*) >90 mL/min  LIPASE, BLOOD      Component Value Range   Lipase 82 (*) 11 - 59 U/L   Dg Abd Acute W/chest  04/15/2012  *RADIOLOGY REPORT*  Clinical Data: Abdominal pain, nausea and vomiting.  ACUTE ABDOMEN SERIES (ABDOMEN 2 VIEW & CHEST 1 VIEW)  Comparison: Chest radiograph performed 04/17/2011, and abdominal radiograph performed 04/16/2011  Findings: The lungs are well-aerated and clear.  There is no evidence of focal opacification, pleural effusion or pneumothorax. The cardiomediastinal silhouette is mildly enlarged.  Scattered prominent air-fluid levels are noted within the small bowel, with distension of small bowel loops to 4.2 cm in maximal diameter, concerning for bowel obstruction.  Stool is noted within the visualized portions of the colon; the stomach is partially filled with fluid and air.  No free intra-abdominal air is identified on the provided upright view.  No acute osseous abnormalities are seen; the sacroiliac joints are unremarkable in appearance.  Scattered clips are noted within the abdomen and pelvis.  IMPRESSION:  1.  Prominent air-fluid levels within the small bowel, with distension of small bowel loops to 4.2 cm in maximal diameter, concerning for bowel obstruction.  Stool noted within the colon; no free intra-abdominal air seen. 2.  Lungs clear; mild cardiomegaly noted.  These results were called by telephone on 04/15/2012 at 03:51 a.m. to Dr. Dione Booze, who verbally acknowledged these results.   Original Report Authenticated By: Tonia Ghent, M.D.    Images viewed by me.  1. Small bowel obstruction       MDM  Abdominal pain and vomiting  worrisome for possible recurrent small bowel obstruction. She'll be sent for abdominal x-rays. Old records are reviewed, and she has had hospitalizations for small bowel obstruction.  X-ray shows small bowel obstruction. Case is discussed with Dr. Julian Reil, who agrees to admit the patient.      Dione Booze, MD 04/15/12 [phone]

## 2012-04-15 NOTE — ED Notes (Addendum)
Alert, NAD, calm, interactive, c/o pain and nausea.  

## 2012-04-15 NOTE — H&P (Addendum)
[facility] History and Physical  Monica Rhodes JXB:[id number] DOB: [birth year] DOA: 04/15/2012  Referring physician: ED PCP: Geraldo Pitter, MD  Specialists: None  Chief Complaint: N/V  HPI: Monica Rhodes is a 78 y.o. female with PMH significant for SBO, last admission for this was in November of 2012, prolonged admission at that time.  Unfortunately over the month of October she has had numerous episodes of nausea she reports, then today she developed severe nausea, came to the ED and had 1 episode of vomiting.  She has had no fever nor chills.  Passing a small amount of stool in her ostomy bag.  In the ED an X ray of her abdomen confirmed the presence of multiple air-fluid levels concerning for SBO.  Hospitalist service has been asked to admit the patient.  Review of Systems: 12 systems reviewed and negative.  Past Medical History  Diagnosis Date  . Hypertension   . UTI (urinary tract infection)   . SBO (small bowel obstruction)     Recurrent, prolonged hospitalization November, 2012  . Protein calorie malnutrition   . Hyperlipidemia   . DM (diabetes mellitus)   . COPD (chronic obstructive pulmonary disease)   . PVD (peripheral vascular disease)   . Colon cancer   . Bradycardia   . Tobacco abuse   . CAD (coronary artery disease)     Non-STEMI November, 2012,, Cardiac catheterization was attempted, despite significant efforts catheters could not be progressed to the coronary arteries ., Therefore medical therapy  . Carotid artery disease     Doppler  April 16, 2011,, total occlusion LICA, 60-79% R. ICA  . PAD (peripheral artery disease)     Suggestions significant stenosis distal aorta by CT angiogram  November, 2012  . Ejection fraction     EF 60%, echo, November, 2012,  . Mitral regurgitation     Moderate, echo, November, 2012  . Right ventricular dysfunction     Moderate, echo, November, 2012  . Edema     December, 2012  . Rash     Rash  probably related  to hydralazine  December, 2012   Past Surgical History  Procedure Date  . Colostomy   . Abdominoperineal proctocolectomy    Social History:  reports that she has been smoking Cigarettes.  She has been smoking about .5 packs per day. She does not have any smokeless tobacco history on file. She reports that she does not drink alcohol or use illicit drugs.   Allergies  Allergen Reactions  . Promethazine Other (See Comments)    Unknown     Family History  Problem Relation Age of Onset  . Coronary artery disease     Prior to Admission medications   Medication Sig Start Date End Date Taking? Authorizing Provider  cloNIDine (CATAPRES) 0.2 MG tablet Take 0.2 mg by mouth 2 (two) times daily.    Yes Historical Provider, MD  digoxin (LANOXIN) 0.125 MG tablet Take 125 mcg by mouth daily.     Yes Historical Provider, MD  enalapril (VASOTEC) 20 MG tablet Take 40 mg by mouth daily.   Yes Historical Provider, MD  fenofibrate 160 MG tablet Take 160 mg by mouth daily.     Yes Historical Provider, MD  hydrochlorothiazide (HYDRODIURIL) 25 MG tablet Take 25 mg by mouth daily.     Yes Historical Provider, MD  metoprolol (LOPRESSOR) 50 MG tablet Take 50 mg by mouth 2 (two) times daily.   Yes Historical Provider, MD  nitroGLYCERIN (NITROSTAT)  0.4 MG SL tablet Place 0.4 mg under the tongue every 5 (five) minutes as needed. For chest pain 04/27/11 04/26/12 Yes Hildred Laser, MD  ondansetron (ZOFRAN) 8 MG tablet Take 8 mg by mouth every 8 (eight) hours as needed. For nausea   Yes Historical Provider, MD  prazosin (MINIPRESS) 2 MG capsule Take 2 mg by mouth 2 (two) times daily.     Yes Historical Provider, MD   Physical Exam: Filed Vitals:   04/15/12 0233 04/15/12 0300 04/15/12 0309 04/15/12 0358  BP: 218/66 184/49  166/79  Pulse: 66 55  75  Temp:   98.3 F (36.8 C) 99.3 F (37.4 C)  TempSrc:   Oral Oral  Resp: 16   14  SpO2: 96% 96%  99%    General:  NAD, resting comfortably in bed Eyes:  PEERLA EOMI ENT: mucous membranes moist Neck: supple w/o JVD Cardiovascular: RRR w/o MRG Respiratory: CTA B Abdomen: soft, nt, mild distention, bs+, ostomy bag present Skin: no rash nor lesion Musculoskeletal: MAE, full ROM all 4 extremities Psychiatric: normal tone and affect Neurologic: AAOx3, grossly non-focal   Labs on Admission:  Basic Metabolic Panel:  Lab 04/15/12 2841  NA 138  K 4.3  CL 99  CO2 30  GLUCOSE 149*  BUN 38*  CREATININE 1.14*  CALCIUM 10.3  MG --  PHOS --   Liver Function Tests:  Lab 04/15/12 0257  AST 19  ALT <5  ALKPHOS 42  BILITOT 0.3  PROT 7.5  ALBUMIN 3.9    Lab 04/15/12 0257  LIPASE 82*  AMYLASE --   No results found for this basename: AMMONIA:5 in the last 168 hours CBC:  Lab 04/15/12 0257  WBC 13.3*  NEUTROABS 10.5*  HGB 14.0  HCT 41.7  MCV 86.7  PLT 224   Cardiac Enzymes: No results found for this basename: CKTOTAL:5,CKMB:5,CKMBINDEX:5,TROPONINI:5 in the last 168 hours  BNP (last 3 results) No results found for this basename: PROBNP:3 in the last 8760 hours CBG: No results found for this basename: GLUCAP:5 in the last 168 hours  Radiological Exams on Admission: Dg Abd Acute W/chest  04/15/2012  *RADIOLOGY REPORT*  Clinical Data: Abdominal pain, nausea and vomiting.  ACUTE ABDOMEN SERIES (ABDOMEN 2 VIEW & CHEST 1 VIEW)  Comparison: Chest radiograph performed 04/17/2011, and abdominal radiograph performed 04/16/2011  Findings: The lungs are well-aerated and clear.  There is no evidence of focal opacification, pleural effusion or pneumothorax. The cardiomediastinal silhouette is mildly enlarged.  Scattered prominent air-fluid levels are noted within the small bowel, with distension of small bowel loops to 4.2 cm in maximal diameter, concerning for bowel obstruction.  Stool is noted within the visualized portions of the colon; the stomach is partially filled with fluid and air.  No free intra-abdominal air is identified on the  provided upright view.  No acute osseous abnormalities are seen; the sacroiliac joints are unremarkable in appearance.  Scattered clips are noted within the abdomen and pelvis.  IMPRESSION:  1.  Prominent air-fluid levels within the small bowel, with distension of small bowel loops to 4.2 cm in maximal diameter, concerning for bowel obstruction.  Stool noted within the colon; no free intra-abdominal air seen. 2.  Lungs clear; mild cardiomegaly noted.  These results were called by telephone on 04/15/2012 at 03:51 a.m. to Dr. Dione Booze, who verbally acknowledged these results.   Original Report Authenticated By: Tonia Ghent, M.D.     EKG: Independently reviewed.  Assessment/Plan Principal Problem:  *SBO (small  bowel obstruction) Active Problems:  History of colectomy  Hypertension  CAD (coronary artery disease)  Hyperlipidemia  DM (diabetes mellitus)   1. SBO - PSBO in light of her passage of stool still, will keep patient NPO except sips with meds, IVF, conservative management for now, only 1 episode of vomiting and still passing stools so am still comfortable at this point with giving meds and not putting in an NG tube which she states she would rather avoid, although this would change if her symptoms were to worsen.  No CT abdomen obtained by ED given her long history of SBO due to adhesions in the past, if symptoms worsen or do not improve then may need to obtain this for further work up / confirmation. 2. HTN - continue home meds 3. DM - appears to be diet controlled, monitor daily CBGs 4. Hyperlipidemia - continue home meds 5. CAD - chronic and stable, but patient had STEMI during her SBO admission last November, no symptoms at this time but will put her on a cardiac monitor to be on the safe side especially given her history.    Code Status: Full Code (must indicate code status--if unknown or must be presumed, indicate so) Family Communication: No family in room (indicate person  spoken with, if applicable, with phone number if by telephone) Disposition Plan: Admit to inpatient (indicate anticipated LOS)  Time spent: 70 min  GARDNER, JARED M. [facility] Pager [phone]  If 7PM-7AM, please contact night-coverage [email or web address] Password [email or web address] 04/15/2012, 4:29 AM

## 2012-04-16 ENCOUNTER — Inpatient Hospital Stay (HOSPITAL_COMMUNITY): Payer: Medicare Other

## 2012-04-16 LAB — GLUCOSE, CAPILLARY
Glucose-Capillary: 122 mg/dL — ABNORMAL HIGH (ref 70–99)
Glucose-Capillary: 137 mg/dL — ABNORMAL HIGH (ref 70–99)
Glucose-Capillary: 126 mg/dL — ABNORMAL HIGH (ref 70–99)
Glucose-Capillary: 118 mg/dL — ABNORMAL HIGH (ref 70–99)
Glucose-Capillary: 104 mg/dL — ABNORMAL HIGH (ref 70–99)

## 2012-04-16 LAB — URINE CULTURE

## 2012-04-16 MED ORDER — SORBITOL 70 % SOLN
960.0000 mL | TOPICAL_OIL | Freq: Once | ORAL | Status: AC
  Administered 2012-04-16: 960 mL via RECTAL
  Filled 2012-04-16: qty 240

## 2012-04-16 MED ORDER — LACTATED RINGERS IV BOLUS (SEPSIS)
1000.0000 mL | Freq: Once | INTRAVENOUS | Status: AC
  Administered 2012-04-16: 1000 mL via INTRAVENOUS

## 2012-04-16 MED ORDER — FLEET ENEMA 7-19 GM/118ML RE ENEM
1.0000 | ENEMA | Freq: Two times a day (BID) | RECTAL | Status: DC
  Administered 2012-04-16: 1 via RECTAL
  Filled 2012-04-16 (×3): qty 1

## 2012-04-16 MED ORDER — PHENOL 1.4 % MT LIQD
1.0000 | OROMUCOSAL | Status: DC | PRN
  Filled 2012-04-16: qty 177

## 2012-04-16 NOTE — Progress Notes (Signed)
[facility] PROGRESS NOTE  Monica Rhodes ZOX:[id number] DOB: 08/17/1927 DOA: 04/15/2012 PCP: Geraldo Pitter, MD  Assessment/Plan: 1. 1. SBO:  - bowel rest - iv fluids - one episode of vomiting.  - CT ABD and pelvis without contrast showed persistent sbo - NG tube placed today - Surgery consult appreciated.  2. Hypertension : better controlled .   3. DVT prophylaxis.     Code Status: full code Family Communication: sons at bedside Disposition Plan: 1- 2 days.   Consultants:  Consulted  CCS HPI/Subjective: nauseated  Objective: Filed Vitals:   04/16/12 0330 04/16/12 0540 04/16/12 1020 04/16/12 1432  BP: 148/48 170/47 154/44 172/45  Pulse: 75 81 72 73  Temp:  98.3 F (36.8 C)  97.1 F (36.2 C)  TempSrc:  Oral  Oral  Resp:  16  18  Height:      Weight:  54.931 kg (121 lb 1.6 oz)    SpO2:  97%  95%    Intake/Output Summary (Last 24 hours) at 04/16/12 2027 Last data filed at 04/16/12 1900  Gross per 24 hour  Intake 926.25 ml  Output   1275 ml  Net -348.75 ml   Filed Weights   04/15/12 0623 04/16/12 0540  Weight: 54.2 kg (119 lb 7.8 oz) 54.931 kg (121 lb 1.6 oz)    Exam:   General:  Alert afebrile comfortable, but confused  Cardiovascular: s1s2  Respiratory: CTAB, no wheezing   Abdomen: soft, NT mildly distended, bowel sounds heard    Data Reviewed: Basic Metabolic Panel:  Lab 04/15/12 8119 04/15/12 0257  NA -- 138  K -- 4.3  CL -- 99  CO2 -- 30  GLUCOSE -- 149*  BUN -- 38*  CREATININE 1.15* 1.14*  CALCIUM -- 10.3  MG -- --  PHOS -- --   Liver Function Tests:  Lab 04/15/12 0257  AST 19  ALT <5  ALKPHOS 42  BILITOT 0.3  PROT 7.5  ALBUMIN 3.9    Lab 04/15/12 0257  LIPASE 82*  AMYLASE --   No results found for this basename: AMMONIA:5 in the last 168 hours CBC:  Lab 04/15/12 0451 04/15/12 0257  WBC 12.9* 13.3*  NEUTROABS -- 10.5*  HGB 12.8 14.0  HCT 39.2 41.7  MCV 87.1 86.7  PLT 211 224   Cardiac Enzymes: No  results found for this basename: CKTOTAL:5,CKMB:5,CKMBINDEX:5,TROPONINI:5 in the last 168 hours BNP (last 3 results) No results found for this basename: PROBNP:3 in the last 8760 hours CBG:  Lab 04/16/12 1622 04/16/12 1140 04/16/12 0500 04/15/12 2035 04/15/12 0611  GLUCAP 118* 126* 137* 122* 143*    Recent Results (from the past 240 hour(s))  URINE CULTURE     Status: Normal   Collection Time   04/15/12  3:59 AM      Component Value Range Status Comment   Specimen Description URINE, CLEAN CATCH   Final    Special Requests ADDED 0431   Final    Culture  Setup Time 04/15/2012 05:10   Final    Colony Count NO GROWTH   Final    Culture NO GROWTH   Final    Report Status 04/16/2012 FINAL   Final      Studies: Ct Abdomen Pelvis Wo Contrast  04/16/2012  *RADIOLOGY REPORT*  Clinical Data: Continuous vomiting; possible small bowel obstruction.  CT ABDOMEN AND PELVIS WITHOUT CONTRAST  Technique:  Multidetector CT imaging of the abdomen and pelvis was performed following the standard protocol without intravenous contrast.  Comparison: CT of the abdomen and pelvis performed 04/12/2011, and abdominal radiograph performed 04/15/2012  Findings: Trace bilateral pleural effusions are noted.  Diffuse coronary artery calcifications are seen.  There is distension of small bowel loops to 4.4 cm in maximal diameter, extending to the level of the mid to distal ileum at the right hemipelvis.  The mid-ileum is intermittently distended, but there appears to be a transition point at the mid to distal ileum, with decompressed small bowel loops noted more distally.  This most likely reflects an adhesion.  A small amount of associated free fluid is noted tracking along the paracolic gutters and within the pelvis.  A small to moderate amount of stool is noted within the ascending and transverse colon; the descending and remaining sigmoid colon are largely decompressed, extending to a left lower quadrant colostomy. Diffuse  diverticulosis is noted along the residual proximal sigmoid colon.  The liver and spleen are unremarkable in appearance.  The gallbladder is within normal limits.  The pancreas and adrenal glands are unremarkable.  Nonspecific perinephric stranding is noted bilaterally.  Scattered calcifications at the renal hila are thought to be vascular in nature, given diffuse vascular calcifications within the abdomen. There is dense calcification along the abdominal aorta, with associated ectasia of the mid abdominal aorta to 2.9 cm in diameter.  The appearance of the common iliac arteries suggests marked bilateral stenosis; this is also suggested at the level of the common femoral arteries bilaterally.  The appendix is normal in caliber, without evidence for appendicitis.  The bladder is mildly distended and grossly unremarkable in appearance.  The uterus is grossly unremarkable.  No suspicious adnexal masses are seen.  No inguinal lymphadenopathy is seen.  No acute osseous abnormalities are identified.  IMPRESSION:  1.  Distension of small bowel loops to 4.4 cm in maximal diameter, with an apparent transition point at the mid to distal ileum in the right hemipelvis.  This most likely reflects an adhesion.  The intermittent distension of the mid ileum suggests against a very high-grade small bowel obstruction. 2.  Small amount of free fluid tracking along the paracolic gutters and within the pelvis. 3.  Dense calcification along the abdominal aorta, with ectasia of the mid abdominal aorta to 2.9 cm in diameter.  Marked bilateral stenosis noted along the common iliac arteries, and at the femoral arteries bilaterally. 4.  Trace bilateral pleural effusions noted. 5.  Diffuse coronary artery calcifications seen. 6.  Diffuse diverticulosis along the residual proximal sigmoid colon; the patient's left lower quadrant colostomy is unremarkable in appearance.  No evidence of diverticulitis.   Original Report Authenticated By: Tonia Ghent, M.D.    Dg Abd 2 Views  04/16/2012  *RADIOLOGY REPORT*  Clinical Data: Evaluate for small bowel obstruction  ABDOMEN - 2 VIEW  Comparison: 04/16/2012  Findings: There are dilated loops of small bowel up to 4.4 cm. Surgical clips within the pelvis.  Relative paucity of colonic gas. Hemidiaphragms are partially excluded from image.  There is suggestion of bibasilar opacities.  Diffuse osteopenia. Mild left hip degenerative change.  IMPRESSION: Small bowel obstruction pattern. Mild interval increased small bowel distension and decreased colonic gas.   Original Report Authenticated By: Jearld Lesch, M.D.    Dg Abd Acute W/chest  04/15/2012  *RADIOLOGY REPORT*  Clinical Data: Abdominal pain, nausea and vomiting.  ACUTE ABDOMEN SERIES (ABDOMEN 2 VIEW & CHEST 1 VIEW)  Comparison: Chest radiograph performed 04/17/2011, and abdominal radiograph performed 04/16/2011  Findings: The lungs  are well-aerated and clear.  There is no evidence of focal opacification, pleural effusion or pneumothorax. The cardiomediastinal silhouette is mildly enlarged.  Scattered prominent air-fluid levels are noted within the small bowel, with distension of small bowel loops to 4.2 cm in maximal diameter, concerning for bowel obstruction.  Stool is noted within the visualized portions of the colon; the stomach is partially filled with fluid and air.  No free intra-abdominal air is identified on the provided upright view.  No acute osseous abnormalities are seen; the sacroiliac joints are unremarkable in appearance.  Scattered clips are noted within the abdomen and pelvis.  IMPRESSION:  1.  Prominent air-fluid levels within the small bowel, with distension of small bowel loops to 4.2 cm in maximal diameter, concerning for bowel obstruction.  Stool noted within the colon; no free intra-abdominal air seen. 2.  Lungs clear; mild cardiomegaly noted.  These results were called by telephone on 04/15/2012 at 03:51 a.m. to Dr. Dione Booze,  who verbally acknowledged these results.   Original Report Authenticated By: Tonia Ghent, M.D.     Scheduled Meds:    . chlorhexidine  15 mL Mouth/Throat BID  . cloNIDine  0.2 mg Oral BID  . digoxin  125 mcg Oral Daily  . enalapril  40 mg Oral Daily  . heparin  5,000 Units Subcutaneous Q8H  . hydrochlorothiazide  25 mg Oral Daily  . [COMPLETED] influenza  inactive virus vaccine  0.5 mL Intramuscular Tomorrow-1000  . [COMPLETED] lactated ringers  1,000 mL Intravenous Once  . metoprolol  50 mg Oral BID  . prazosin  2 mg Oral BID  . sodium chloride  3 mL Intravenous Q12H  . [COMPLETED] sorbitol, milk of mag, mineral oil, glycerin (SMOG) enema  960 mL Rectal Once  . [DISCONTINUED] sodium phosphate  1 enema Rectal BID   Continuous Infusions:    . sodium chloride 75 mL/hr at 04/15/12 1612    Principal Problem:  *SBO (small bowel obstruction) Active Problems:  History of colectomy  Hypertension  CAD (coronary artery disease)  Hyperlipidemia  DM (diabetes mellitus)        Rayleen Wyrick  [facility] Pager [phone]. If 8PM-8AM, please contact night-coverage at [email or web address], password [facility] 04/16/2012, 8:27 PM  LOS: 1 day

## 2012-04-16 NOTE — Progress Notes (Signed)
Called by RN to assist with placement of NG tube.  18 french NG tube placed in left nare on first attempt.  Positive air bolus heard, secured to nose.  NG tube connected to LIWS. Patient tolerated procedure well.  Rn to call if assistance needed

## 2012-04-16 NOTE — Progress Notes (Signed)
Fleet enema given with some good results. Pt had approximately 3 soft stools dumped into colostomy bag.

## 2012-04-16 NOTE — Progress Notes (Signed)
Subjective: 1-2 episodes of emesis yesterday. Denies abd pain today. No n/v this am  Objective: Vital signs in last 24 hours: Temp:  [98.3 F (36.8 C)-98.6 F (37 C)] 98.3 F (36.8 C) (11/05 0540) Pulse Rate:  [72-100] 72  (11/05 1020) Resp:  [16-18] 16  (11/05 0540) BP: (148-193)/(44-89) 154/44 mmHg (11/05 1020) SpO2:  [94 %-97 %] 97 % (11/05 0540) Weight:  [121 lb 1.6 oz (54.931 kg)] 121 lb 1.6 oz (54.931 kg) (11/05 0540) Last BM Date: 04/15/12  Intake/Output from previous day: 11/04 0701 - 11/05 0700 In: 1945.3 [P.O.:10; I.V.:1931.3; IV Piggyback:4] Out: 550 [Urine:300; Emesis/NG output:250] Intake/Output this shift: Total I/O In: 0  Out: 700 [Urine:700]  Alert, nad abd soft, distended, hypoBS. Ostomy - viable - lots of air in bag  Lab Results:   Basename 04/15/12 0451 04/15/12 0257  WBC 12.9* 13.3*  HGB 12.8 14.0  HCT 39.2 41.7  PLT 211 224   BMET  Basename 04/15/12 0451 04/15/12 0257  NA -- 138  K -- 4.3  CL -- 99  CO2 -- 30  GLUCOSE -- 149*  BUN -- 38*  CREATININE 1.15* 1.14*  CALCIUM -- 10.3   PT/INR No results found for this basename: LABPROT:2,INR:2 in the last 72 hours ABG No results found for this basename: PHART:2,PCO2:2,PO2:2,HCO3:2 in the last 72 hours  Studies/Results: Ct Abdomen Pelvis Wo Contrast  04/16/2012  *RADIOLOGY REPORT*  Clinical Data: Continuous vomiting; possible small bowel obstruction.  CT ABDOMEN AND PELVIS WITHOUT CONTRAST  Technique:  Multidetector CT imaging of the abdomen and pelvis was performed following the standard protocol without intravenous contrast.  Comparison: CT of the abdomen and pelvis performed 04/12/2011, and abdominal radiograph performed 04/15/2012  Findings: Trace bilateral pleural effusions are noted.  Diffuse coronary artery calcifications are seen.  There is distension of small bowel loops to 4.4 cm in maximal diameter, extending to the level of the mid to distal ileum at the right hemipelvis.  The  mid-ileum is intermittently distended, but there appears to be a transition point at the mid to distal ileum, with decompressed small bowel loops noted more distally.  This most likely reflects an adhesion.  A small amount of associated free fluid is noted tracking along the paracolic gutters and within the pelvis.  A small to moderate amount of stool is noted within the ascending and transverse colon; the descending and remaining sigmoid colon are largely decompressed, extending to a left lower quadrant colostomy. Diffuse diverticulosis is noted along the residual proximal sigmoid colon.  The liver and spleen are unremarkable in appearance.  The gallbladder is within normal limits.  The pancreas and adrenal glands are unremarkable.  Nonspecific perinephric stranding is noted bilaterally.  Scattered calcifications at the renal hila are thought to be vascular in nature, given diffuse vascular calcifications within the abdomen. There is dense calcification along the abdominal aorta, with associated ectasia of the mid abdominal aorta to 2.9 cm in diameter.  The appearance of the common iliac arteries suggests marked bilateral stenosis; this is also suggested at the level of the common femoral arteries bilaterally.  The appendix is normal in caliber, without evidence for appendicitis.  The bladder is mildly distended and grossly unremarkable in appearance.  The uterus is grossly unremarkable.  No suspicious adnexal masses are seen.  No inguinal lymphadenopathy is seen.  No acute osseous abnormalities are identified.  IMPRESSION:  1.  Distension of small bowel loops to 4.4 cm in maximal diameter, with an apparent transition point  at the mid to distal ileum in the right hemipelvis.  This most likely reflects an adhesion.  The intermittent distension of the mid ileum suggests against a very high-grade small bowel obstruction. 2.  Small amount of free fluid tracking along the paracolic gutters and within the pelvis. 3.   Dense calcification along the abdominal aorta, with ectasia of the mid abdominal aorta to 2.9 cm in diameter.  Marked bilateral stenosis noted along the common iliac arteries, and at the femoral arteries bilaterally. 4.  Trace bilateral pleural effusions noted. 5.  Diffuse coronary artery calcifications seen. 6.  Diffuse diverticulosis along the residual proximal sigmoid colon; the patient's left lower quadrant colostomy is unremarkable in appearance.  No evidence of diverticulitis.   Original Report Authenticated By: Tonia Ghent, M.D.    Dg Abd Acute W/chest  04/15/2012  *RADIOLOGY REPORT*  Clinical Data: Abdominal pain, nausea and vomiting.  ACUTE ABDOMEN SERIES (ABDOMEN 2 VIEW & CHEST 1 VIEW)  Comparison: Chest radiograph performed 04/17/2011, and abdominal radiograph performed 04/16/2011  Findings: The lungs are well-aerated and clear.  There is no evidence of focal opacification, pleural effusion or pneumothorax. The cardiomediastinal silhouette is mildly enlarged.  Scattered prominent air-fluid levels are noted within the small bowel, with distension of small bowel loops to 4.2 cm in maximal diameter, concerning for bowel obstruction.  Stool is noted within the visualized portions of the colon; the stomach is partially filled with fluid and air.  No free intra-abdominal air is identified on the provided upright view.  No acute osseous abnormalities are seen; the sacroiliac joints are unremarkable in appearance.  Scattered clips are noted within the abdomen and pelvis.  IMPRESSION:  1.  Prominent air-fluid levels within the small bowel, with distension of small bowel loops to 4.2 cm in maximal diameter, concerning for bowel obstruction.  Stool noted within the colon; no free intra-abdominal air seen. 2.  Lungs clear; mild cardiomegaly noted.  These results were called by telephone on 04/15/2012 at 03:51 a.m. to Dr. Dione Booze, who verbally acknowledged these results.   Original Report Authenticated By:  Tonia Ghent, M.D.     Anti-infectives: Anti-infectives    None      Assessment/Plan: s/p * No surgery found * H/o APR for rectal ca pSBO - ct last night showed partial SBO. Contrast went all the way thru. Plain films from today essentially unchanged. Still has dilated loops of SB. rec continued bowel rest, NPO x ice chips; if has on-going vomiting - rec NG tube to LIWS OOB, Ambulate  Mary Sella. Andrey Campanile, MD, FACS General, Bariatric, & Minimally Invasive Surgery [facility], [place]   LOS: 1 day    Atilano Ina 04/16/2012

## 2012-04-16 NOTE — Progress Notes (Signed)
Co-signed for Shlondria Frink RN/BSN for Assessments, IV assessments, I&O's, Vital Signs, Medication administration, Progress notes, Care plans, and patient education. Gift Rueckert M, RN/ BSN  

## 2012-04-17 ENCOUNTER — Inpatient Hospital Stay (HOSPITAL_COMMUNITY): Payer: Medicare Other

## 2012-04-17 DIAGNOSIS — J449 Chronic obstructive pulmonary disease, unspecified: Secondary | ICD-10-CM

## 2012-04-17 LAB — CBC
HCT: 37.3 % (ref 36.0–46.0)
MCH: 28.5 pg (ref 26.0–34.0)
MCHC: 32.4 g/dL (ref 30.0–36.0)
MCV: 88 fL (ref 78.0–100.0)
RDW: 13.7 % (ref 11.5–15.5)

## 2012-04-17 LAB — GLUCOSE, CAPILLARY: Glucose-Capillary: 115 mg/dL — ABNORMAL HIGH (ref 70–99)

## 2012-04-17 MED ORDER — HYDRALAZINE HCL 50 MG PO TABS
50.0000 mg | ORAL_TABLET | Freq: Three times a day (TID) | ORAL | Status: DC
  Filled 2012-04-17 (×3): qty 1

## 2012-04-17 MED ORDER — SORBITOL 70 % SOLN
960.0000 mL | TOPICAL_OIL | Freq: Once | ORAL | Status: DC
  Filled 2012-04-17 (×2): qty 240

## 2012-04-17 MED ORDER — AMLODIPINE BESYLATE 10 MG PO TABS
10.0000 mg | ORAL_TABLET | Freq: Every day | ORAL | Status: DC
  Administered 2012-04-17 – 2012-04-18 (×2): 10 mg via ORAL
  Filled 2012-04-17 (×2): qty 1

## 2012-04-17 MED ORDER — PANTOPRAZOLE SODIUM 40 MG IV SOLR
40.0000 mg | Freq: Two times a day (BID) | INTRAVENOUS | Status: DC
  Administered 2012-04-17 – 2012-04-18 (×3): 40 mg via INTRAVENOUS
  Filled 2012-04-17 (×4): qty 40

## 2012-04-17 NOTE — Progress Notes (Signed)
Agree with above.  Abdomen much softer and without tenderness Good ostomy output X-rays normal Advance diet as tolerated.  Wilmon Arms. Corliss Skains, MD, [facility]  04/17/2012 12:18 PM

## 2012-04-17 NOTE — Progress Notes (Signed)
Co-signed for Shlondria Frink RN/BSN for Assessments, IV assessments, I&O's, Vital Signs, Medication administration, Progress notes, Care plans, and patient education. Leanor Voris M, RN/ BSN  

## 2012-04-17 NOTE — Progress Notes (Signed)
Patient's appearance of gastric secretions is brown-green with a dark reddish tint to it. Notified GI MD and attending MD to make them aware. New orders given. Patient has no verbal complaints. No signs or symptoms of distress or discomfort. Will continue to monitor patient for further changes in condition.

## 2012-04-17 NOTE — Progress Notes (Signed)
Patient ID: Monica Rhodes, female   DOB: 06-22-1927, 78 y.o.   MRN: [id number]    Subjective: Doing much better today, pain resolved, large BM in bag yesterday, denies n/v  Objective: Vital signs in last 24 hours: Temp:  [97.1 F (36.2 C)-99.1 F (37.3 C)] 99.1 F (37.3 C) (11/06 0450) Pulse Rate:  [67-79] 75  (11/06 1027) Resp:  [18] 18  (11/06 0450) BP: (138-213)/(41-58) 213/55 mmHg (11/06 1027) SpO2:  [95 %-98 %] 98 % (11/06 0450) Weight:  [119 lb 3.2 oz (54.069 kg)] 119 lb 3.2 oz (54.069 kg) (11/06 0450) Last BM Date: 04/17/12  Intake/Output from previous day: 11/05 0701 - 11/06 0700 In: 200 [NG/GT:200] Out: 2025 [Urine:1025; Emesis/NG output:1000] Intake/Output this shift:    Alert, nad abd soft, non distended,+bs, Ostomy - viable - liquid output with particular matter  Lab Results:   [facility] 04/15/12 0451 04/15/12 0257  WBC 12.9* 13.3*  HGB 12.8 14.0  HCT 39.2 41.7  PLT 211 224   BMET  Basename 04/15/12 0451 04/15/12 0257  NA -- 138  K -- 4.3  CL -- 99  CO2 -- 30  GLUCOSE -- 149*  BUN -- 38*  CREATININE 1.15* 1.14*  CALCIUM -- 10.3   PT/INR No results found for this basename: LABPROT:2,INR:2 in the last 72 hours ABG No results found for this basename: PHART:2,PCO2:2,PO2:2,HCO3:2 in the last 72 hours  Studies/Results: Ct Abdomen Pelvis Wo Contrast  04/16/2012  *RADIOLOGY REPORT*  Clinical Data: Continuous vomiting; possible small bowel obstruction.  CT ABDOMEN AND PELVIS WITHOUT CONTRAST  Technique:  Multidetector CT imaging of the abdomen and pelvis was performed following the standard protocol without intravenous contrast.  Comparison: CT of the abdomen and pelvis performed 04/12/2011, and abdominal radiograph performed 04/15/2012  Findings: Trace bilateral pleural effusions are noted.  Diffuse coronary artery calcifications are seen.  There is distension of small bowel loops to 4.4 cm in maximal diameter, extending to the level of the mid to distal ileum  at the right hemipelvis.  The mid-ileum is intermittently distended, but there appears to be a transition point at the mid to distal ileum, with decompressed small bowel loops noted more distally.  This most likely reflects an adhesion.  A small amount of associated free fluid is noted tracking along the paracolic gutters and within the pelvis.  A small to moderate amount of stool is noted within the ascending and transverse colon; the descending and remaining sigmoid colon are largely decompressed, extending to a left lower quadrant colostomy. Diffuse diverticulosis is noted along the residual proximal sigmoid colon.  The liver and spleen are unremarkable in appearance.  The gallbladder is within normal limits.  The pancreas and adrenal glands are unremarkable.  Nonspecific perinephric stranding is noted bilaterally.  Scattered calcifications at the renal hila are thought to be vascular in nature, given diffuse vascular calcifications within the abdomen. There is dense calcification along the abdominal aorta, with associated ectasia of the mid abdominal aorta to 2.9 cm in diameter.  The appearance of the common iliac arteries suggests marked bilateral stenosis; this is also suggested at the level of the common femoral arteries bilaterally.  The appendix is normal in caliber, without evidence for appendicitis.  The bladder is mildly distended and grossly unremarkable in appearance.  The uterus is grossly unremarkable.  No suspicious adnexal masses are seen.  No inguinal lymphadenopathy is seen.  No acute osseous abnormalities are identified.  IMPRESSION:  1.  Distension of small bowel loops to 4.4  cm in maximal diameter, with an apparent transition point at the mid to distal ileum in the right hemipelvis.  This most likely reflects an adhesion.  The intermittent distension of the mid ileum suggests against a very high-grade small bowel obstruction. 2.  Small amount of free fluid tracking along the paracolic gutters  and within the pelvis. 3.  Dense calcification along the abdominal aorta, with ectasia of the mid abdominal aorta to 2.9 cm in diameter.  Marked bilateral stenosis noted along the common iliac arteries, and at the femoral arteries bilaterally. 4.  Trace bilateral pleural effusions noted. 5.  Diffuse coronary artery calcifications seen. 6.  Diffuse diverticulosis along the residual proximal sigmoid colon; the patient's left lower quadrant colostomy is unremarkable in appearance.  No evidence of diverticulitis.   Original Report Authenticated By: Tonia Ghent, M.D.    Dg Abd 2 Views  04/17/2012  *RADIOLOGY REPORT*  Clinical Data: Abdominal distention, weakness  ABDOMEN - 2 VIEW  Comparison: Abdomen films of 04/16/2012 and CT abdomen pelvis of the same date  Findings: An NG tube is present with the tip in the distal body of the stomach.  There has been significant improvement in small bowel distention, with some colonic bowel gas now present.  No evidence of persistent small bowel obstruction is seen.  No free air is noted on the erect view.  There are bilateral pleural effusions present with basilar atelectasis.  IMPRESSION:  1.  Significant improvement with apparent resolution of the small bowel obstruction. No free air. 2.  Bilateral pleural effusions.   Original Report Authenticated By: Dwyane Dee, M.D.    Dg Abd 2 Views  04/16/2012  *RADIOLOGY REPORT*  Clinical Data: Evaluate for small bowel obstruction  ABDOMEN - 2 VIEW  Comparison: 04/16/2012  Findings: There are dilated loops of small bowel up to 4.4 cm. Surgical clips within the pelvis.  Relative paucity of colonic gas. Hemidiaphragms are partially excluded from image.  There is suggestion of bibasilar opacities.  Diffuse osteopenia. Mild left hip degenerative change.  IMPRESSION: Small bowel obstruction pattern. Mild interval increased small bowel distension and decreased colonic gas.   Original Report Authenticated By: Jearld Lesch, M.D.      Anti-infectives: Anti-infectives    None      Assessment/Plan: 1. PSBO: films this am show almost complete resolution of SBO, patient with good output from ostomy now, n/v resolved.   --d/c ngt  --allow clears  --OOB and ambulate  --if tolerates clears today then advance as tolerated tomorrow.      LOS: 2 days    Angeles Paolucci 04/17/2012 [place] [facility], [place]

## 2012-04-17 NOTE — Progress Notes (Signed)
[facility] PROGRESS NOTE  Dashanti Guerrera ZOX:[id number] DOB: 1927/07/26 DOA: 04/15/2012 PCP: Geraldo Pitter, MD  Assessment/Plan: 1. 1. PSBO:  Patient was placed on bowel rest and iv fluids from admission - CT ABD and pelvis without contrast showed persistent sbo. - NG tube placed 04/17/12 and patient had resolution of SBO with liquid stool in the colostomy bag on 04/17/12 . NG tube removed 04/17/12   2. Hypertension : controlled on home meds. We should probably try to get her off clonidine as it causes constipation. Start amlodipine 10 mg daily 04/17/12     Code Status: full code Family Communication:  Disposition Plan: 1- 2 days home   Consultants:  Consulted  CCS HPI/Subjective: no further abdominal pain  Objective: Filed Vitals:   04/17/12 0237 04/17/12 0450 04/17/12 1027 04/17/12 1352  BP: 138/41 172/58 213/55 177/47  Pulse: 67 75 75 70  Temp: 97.9 F (36.6 C) 99.1 F (37.3 C)  98.6 F (37 C)  TempSrc: Oral Oral  Oral  Resp: 18 18  18   Height:      Weight:  54.069 kg (119 lb 3.2 oz)    SpO2:  98%  95%    Intake/Output Summary (Last 24 hours) at 04/17/12 1451 Last data filed at 04/17/12 1300  Gross per 24 hour  Intake    320 ml  Output   2450 ml  Net  -2130 ml   Filed Weights   04/15/12 0623 04/16/12 0540 04/17/12 0450  Weight: 54.2 kg (119 lb 7.8 oz) 54.931 kg (121 lb 1.6 oz) 54.069 kg (119 lb 3.2 oz)    Exam:   axox3  CVS: rrr  RS: ctab   Abdomen - bs present   Data Reviewed: Basic Metabolic Panel:  Lab 04/15/12 8119 04/15/12 0257  NA -- 138  K -- 4.3  CL -- 99  CO2 -- 30  GLUCOSE -- 149*  BUN -- 38*  CREATININE 1.15* 1.14*  CALCIUM -- 10.3  MG -- --  PHOS -- --   Liver Function Tests:  Lab 04/15/12 0257  AST 19  ALT <5  ALKPHOS 42  BILITOT 0.3  PROT 7.5  ALBUMIN 3.9    Lab 04/15/12 0257  LIPASE 82*  AMYLASE --   No results found for this basename: AMMONIA:5 in the last 168 hours CBC:  Lab 04/17/12 1010 04/15/12  0451 04/15/12 0257  WBC 8.6 12.9* 13.3*  NEUTROABS -- -- 10.5*  HGB 12.1 12.8 14.0  HCT 37.3 39.2 41.7  MCV 88.0 87.1 86.7  PLT 182 211 224   Cardiac Enzymes: No results found for this basename: CKTOTAL:5,CKMB:5,CKMBINDEX:5,TROPONINI:5 in the last 168 hours BNP (last 3 results) No results found for this basename: PROBNP:3 in the last 8760 hours CBG:  Lab 04/17/12 0729 04/17/12 0439 04/16/12 2113 04/16/12 1622 04/16/12 1140  GLUCAP 102* 115* 104* 118* 126*    Recent Results (from the past 240 hour(s))  URINE CULTURE     Status: Normal   Collection Time   04/15/12  3:59 AM      Component Value Range Status Comment   Specimen Description URINE, CLEAN CATCH   Final    Special Requests ADDED 0431   Final    Culture  Setup Time 04/15/2012 05:10   Final    Colony Count NO GROWTH   Final    Culture NO GROWTH   Final    Report Status 04/16/2012 FINAL   Final      Studies: Ct Abdomen Pelvis Wo  Contrast  04/16/2012  *RADIOLOGY REPORT*  Clinical Data: Continuous vomiting; possible small bowel obstruction.  CT ABDOMEN AND PELVIS WITHOUT CONTRAST  Technique:  Multidetector CT imaging of the abdomen and pelvis was performed following the standard protocol without intravenous contrast.  Comparison: CT of the abdomen and pelvis performed 04/12/2011, and abdominal radiograph performed 04/15/2012  Findings: Trace bilateral pleural effusions are noted.  Diffuse coronary artery calcifications are seen.  There is distension of small bowel loops to 4.4 cm in maximal diameter, extending to the level of the mid to distal ileum at the right hemipelvis.  The mid-ileum is intermittently distended, but there appears to be a transition point at the mid to distal ileum, with decompressed small bowel loops noted more distally.  This most likely reflects an adhesion.  A small amount of associated free fluid is noted tracking along the paracolic gutters and within the pelvis.  A small to moderate amount of stool is  noted within the ascending and transverse colon; the descending and remaining sigmoid colon are largely decompressed, extending to a left lower quadrant colostomy. Diffuse diverticulosis is noted along the residual proximal sigmoid colon.  The liver and spleen are unremarkable in appearance.  The gallbladder is within normal limits.  The pancreas and adrenal glands are unremarkable.  Nonspecific perinephric stranding is noted bilaterally.  Scattered calcifications at the renal hila are thought to be vascular in nature, given diffuse vascular calcifications within the abdomen. There is dense calcification along the abdominal aorta, with associated ectasia of the mid abdominal aorta to 2.9 cm in diameter.  The appearance of the common iliac arteries suggests marked bilateral stenosis; this is also suggested at the level of the common femoral arteries bilaterally.  The appendix is normal in caliber, without evidence for appendicitis.  The bladder is mildly distended and grossly unremarkable in appearance.  The uterus is grossly unremarkable.  No suspicious adnexal masses are seen.  No inguinal lymphadenopathy is seen.  No acute osseous abnormalities are identified.  IMPRESSION:  1.  Distension of small bowel loops to 4.4 cm in maximal diameter, with an apparent transition point at the mid to distal ileum in the right hemipelvis.  This most likely reflects an adhesion.  The intermittent distension of the mid ileum suggests against a very high-grade small bowel obstruction. 2.  Small amount of free fluid tracking along the paracolic gutters and within the pelvis. 3.  Dense calcification along the abdominal aorta, with ectasia of the mid abdominal aorta to 2.9 cm in diameter.  Marked bilateral stenosis noted along the common iliac arteries, and at the femoral arteries bilaterally. 4.  Trace bilateral pleural effusions noted. 5.  Diffuse coronary artery calcifications seen. 6.  Diffuse diverticulosis along the residual  proximal sigmoid colon; the patient's left lower quadrant colostomy is unremarkable in appearance.  No evidence of diverticulitis.   Original Report Authenticated By: Tonia Ghent, M.D.    Dg Abd 2 Views  04/17/2012  *RADIOLOGY REPORT*  Clinical Data: Abdominal distention, weakness  ABDOMEN - 2 VIEW  Comparison: Abdomen films of 04/16/2012 and CT abdomen pelvis of the same date  Findings: An NG tube is present with the tip in the distal body of the stomach.  There has been significant improvement in small bowel distention, with some colonic bowel gas now present.  No evidence of persistent small bowel obstruction is seen.  No free air is noted on the erect view.  There are bilateral pleural effusions present with basilar atelectasis.  IMPRESSION:  1.  Significant improvement with apparent resolution of the small bowel obstruction. No free air. 2.  Bilateral pleural effusions.   Original Report Authenticated By: Dwyane Dee, M.D.    Dg Abd 2 Views  04/16/2012  *RADIOLOGY REPORT*  Clinical Data: Evaluate for small bowel obstruction  ABDOMEN - 2 VIEW  Comparison: 04/16/2012  Findings: There are dilated loops of small bowel up to 4.4 cm. Surgical clips within the pelvis.  Relative paucity of colonic gas. Hemidiaphragms are partially excluded from image.  There is suggestion of bibasilar opacities.  Diffuse osteopenia. Mild left hip degenerative change.  IMPRESSION: Small bowel obstruction pattern. Mild interval increased small bowel distension and decreased colonic gas.   Original Report Authenticated By: Jearld Lesch, M.D.     Scheduled Meds:    . chlorhexidine  15 mL Mouth/Throat BID  . cloNIDine  0.2 mg Oral BID  . digoxin  125 mcg Oral Daily  . enalapril  40 mg Oral Daily  . metoprolol  50 mg Oral BID  . pantoprazole (PROTONIX) IV  40 mg Intravenous Q12H  . prazosin  2 mg Oral BID  . sodium chloride  3 mL Intravenous Q12H  . [DISCONTINUED] heparin  5,000 Units Subcutaneous Q8H  .  [DISCONTINUED] hydrochlorothiazide  25 mg Oral Daily  . [DISCONTINUED] sorbitol, milk of mag, mineral oil, glycerin (SMOG) enema  960 mL Rectal Once   Continuous Infusions:    . sodium chloride 75 mL/hr at 04/15/12 1612    Principal Problem:  *SBO (small bowel obstruction) Active Problems:  History of colectomy  Hypertension  CAD (coronary artery disease)  Hyperlipidemia  DM (diabetes mellitus)        Elai Vanwyk  [facility] Pager [phone]. If 8PM-8AM, please contact night-coverage at [email or web address], password [facility] 04/17/2012, 2:51 PM  LOS: 2 days

## 2012-04-18 DIAGNOSIS — I779 Disorder of arteries and arterioles, unspecified: Secondary | ICD-10-CM

## 2012-04-18 LAB — CBC
HCT: 32.1 % — ABNORMAL LOW (ref 36.0–46.0)
Hemoglobin: 10.2 g/dL — ABNORMAL LOW (ref 12.0–15.0)
MCHC: 31.8 g/dL (ref 30.0–36.0)
MCV: 87.9 fL (ref 78.0–100.0)
RDW: 13.6 % (ref 11.5–15.5)

## 2012-04-18 LAB — BASIC METABOLIC PANEL
BUN: 31 mg/dL — ABNORMAL HIGH (ref 6–23)
CO2: 36 mEq/L — ABNORMAL HIGH (ref 19–32)
Chloride: 101 mEq/L (ref 96–112)
Creatinine, Ser: 0.92 mg/dL (ref 0.50–1.10)
Glucose, Bld: 114 mg/dL — ABNORMAL HIGH (ref 70–99)

## 2012-04-18 LAB — GLUCOSE, CAPILLARY
Glucose-Capillary: 170 mg/dL — ABNORMAL HIGH (ref 70–99)
Glucose-Capillary: 111 mg/dL — ABNORMAL HIGH (ref 70–99)

## 2012-04-18 MED ORDER — POTASSIUM CHLORIDE CRYS ER 20 MEQ PO TBCR
40.0000 meq | EXTENDED_RELEASE_TABLET | Freq: Three times a day (TID) | ORAL | Status: DC
  Administered 2012-04-18: 40 meq via ORAL
  Filled 2012-04-18: qty 2

## 2012-04-18 MED ORDER — POTASSIUM CHLORIDE CRYS ER 20 MEQ PO TBCR: 20.0000 meq | EXTENDED_RELEASE_TABLET | Freq: Two times a day (BID) | ORAL | Status: DC

## 2012-04-18 MED ORDER — AMLODIPINE BESYLATE 10 MG PO TABS: 10.0000 mg | ORAL_TABLET | Freq: Every day | ORAL | Status: DC

## 2012-04-18 MED ORDER — TRIAMTERENE-HCTZ 37.5-25 MG PO CAPS: 1.0000 | ORAL_CAPSULE | ORAL | Status: DC

## 2012-04-18 NOTE — Progress Notes (Signed)
  Subjective: Tolerating diet Ostomy working well No abdominal pain or nausea  Objective: Vital signs in last 24 hours: Temp:  [98.2 F (36.8 C)-99.5 F (37.5 C)] 98.2 F (36.8 C) (11/07 0518) Pulse Rate:  [70-94] 77  (11/07 0518) Resp:  [16-18] 17  (11/07 0518) BP: (161-213)/(47-58) 167/49 mmHg (11/07 0518) SpO2:  [95 %-98 %] 98 % (11/07 0518) Weight:  [124 lb 4.8 oz (56.382 kg)] 124 lb 4.8 oz (56.382 kg) (11/07 0518) Last BM Date: 05-14-2012  Intake/Output from previous day: 15-May-2023 0701 - 11/07 0700 In: 600 [P.O.:600] Out: 1750 [Urine:625; Emesis/NG output:950; Stool:175] Intake/Output this shift:    Abdomen soft, non tender, non distended Ostomy working well  Lab Results:   Basename 04/18/12 0515 05-14-2012 1010  WBC 7.8 8.6  HGB 10.2* 12.1  HCT 32.1* 37.3  PLT 175 182   BMET  Basename 04/18/12 0515  NA 142  K 2.7*  CL 101  CO2 36*  GLUCOSE 114*  BUN 31*  CREATININE 0.92  CALCIUM 8.1*   PT/INR No results found for this basename: LABPROT:2,INR:2 in the last 72 hours ABG No results found for this basename: PHART:2,PCO2:2,PO2:2,HCO3:2 in the last 72 hours  Studies/Results: Dg Abd 2 Views  May 14, 2012  *RADIOLOGY REPORT*  Clinical Data: Abdominal distention, weakness  ABDOMEN - 2 VIEW  Comparison: Abdomen films of 04/16/2012 and CT abdomen pelvis of the same date  Findings: An NG tube is present with the tip in the distal body of the stomach.  There has been significant improvement in small bowel distention, with some colonic bowel gas now present.  No evidence of persistent small bowel obstruction is seen.  No free air is noted on the erect view.  There are bilateral pleural effusions present with basilar atelectasis.  IMPRESSION:  1.  Significant improvement with apparent resolution of the small bowel obstruction. No free air. 2.  Bilateral pleural effusions.   Original Report Authenticated By: Dwyane Dee, M.D.    Dg Abd 2 Views  04/16/2012  *RADIOLOGY REPORT*   Clinical Data: Evaluate for small bowel obstruction  ABDOMEN - 2 VIEW  Comparison: 04/16/2012  Findings: There are dilated loops of small bowel up to 4.4 cm. Surgical clips within the pelvis.  Relative paucity of colonic gas. Hemidiaphragms are partially excluded from image.  There is suggestion of bibasilar opacities.  Diffuse osteopenia. Mild left hip degenerative change.  IMPRESSION: Small bowel obstruction pattern. Mild interval increased small bowel distension and decreased colonic gas.   Original Report Authenticated By: Jearld Lesch, M.D.     Anti-infectives: Anti-infectives    None      Assessment/Plan: s/p * No surgery found *  Resolved SBO/ileus  Will sign off, available if needed  LOS: 3 days    Saharah Sherrow A 04/18/2012

## 2012-04-18 NOTE — Progress Notes (Signed)
Client and son verbalized understanding of discharge instructions.  Son came back to unit and got her colostomy supplies she had left and her card.  IV discontinued.  Passing flatus through colostomy and small of stool noted.

## 2012-04-18 NOTE — Discharge Summary (Signed)
Physician Discharge Summary  Monica Rhodes:[id number] DOB: 19-Feb-1928 DOA: 04/15/2012  PCP: Monica Pitter, MD  Admit date: 04/15/2012 Discharge date: 04/18/2012  Time spent: 40 minutes  Recommendations for Outpatient Follow-up:  Blood pressure control. Family was instructed to keep a diary. Trying to see if she can get off clonidine Discharge Diagnoses:  Recurrent partial SBO (small bowel obstruction) - resolved  History of colectomy with colostomy  Malignant Hypertension  CAD (coronary artery disease)  Hyperlipidemia  DM (diabetes mellitus)   Discharge Condition: good, tolerating regular diet   Diet recommendation: Low salt heart healthy  Filed Weights   04/16/12 0540 04/17/12 0450 04/18/12 0518  Weight: 54.931 kg (121 lb 1.6 oz) 54.069 kg (119 lb 3.2 oz) 56.382 kg (124 lb 4.8 oz)    History of present illness:  Monica Rhodes is a 78 y.o. female with PMH significant for SBO, last admission for this was in November of 2012, prolonged admission at that time. Unfortunately over the month of October she has had numerous episodes of nausea she reports, then today she developed severe nausea, came to the ED and had 1 episode of vomiting. She has had no fever nor chills. Passing a small amount of stool in her ostomy bag.   Hospital Course:  1. recurrent partial small bowel obstruction most likely due to constipation. Patient was admitted with nausea vomiting. CT scan of abdomen and pelvis showed small bowel obstruction. NG tube was placed and the patient improved significantly. She also received a SMOG enema - and the obstruction resolved. Diet was advanced without recurrence of her symptoms  2. severe malignant hypertension. We have continued the patient on her home medications. I have taken her off of clonidine and started her on Norvasc to see if we can avoid using clonidine in a patient with recurrent small bowel obstructions. The patient and family were instructed to keep a  journal of blood pressure readings and to return to the primary care physician at follow up visit.   Procedures: NG tube placement  Consultations:  Surgery     Discharge Exam: Filed Vitals:   04/17/12 1352 04/17/12 1643 04/17/12 2129 04/18/12 0518  BP: 177/47 161/49 176/58 167/49  Pulse: 70 72 94 77  Temp: 98.6 F (37 C)  99.5 F (37.5 C) 98.2 F (36.8 C)  TempSrc: Oral  Oral Oral  Resp: 18  16 17   Height:      Weight:    56.382 kg (124 lb 4.8 oz)  SpO2: 95%  97% 98%    General: axox3 Cardiovascular: rrr Respiratory: ctab  Discharge Instructions      Discharge Orders    Future Orders Please Complete By Expires   Diet - low sodium heart healthy      Increase activity slowly          Medication List     As of 04/18/2012 10:27 AM    STOP taking these medications         cloNIDine 0.2 MG tablet   Commonly known as: CATAPRES      hydrochlorothiazide 25 MG tablet   Commonly known as: HYDRODIURIL      TAKE these medications         amLODipine 10 MG tablet   Commonly known as: NORVASC   Take 1 tablet (10 mg total) by mouth daily.      digoxin 0.125 MG tablet   Commonly known as: LANOXIN   Take 125 mcg by mouth daily.  enalapril 20 MG tablet   Commonly known as: VASOTEC   Take 40 mg by mouth daily.      fenofibrate 160 MG tablet   Take 160 mg by mouth daily.      metoprolol 50 MG tablet   Commonly known as: LOPRESSOR   Take 50 mg by mouth 2 (two) times daily.      nitroGLYCERIN 0.4 MG SL tablet   Commonly known as: NITROSTAT   Place 0.4 mg under the tongue every 5 (five) minutes as needed. For chest pain      ondansetron 8 MG tablet   Commonly known as: ZOFRAN   Take 8 mg by mouth every 8 (eight) hours as needed. For nausea      potassium chloride SA 20 MEQ tablet   Commonly known as: K-DUR,KLOR-CON   Take 1 tablet (20 mEq total) by mouth 2 (two) times daily.      prazosin 2 MG capsule   Commonly known as: MINIPRESS   Take 2 mg by  mouth 2 (two) times daily.      triamterene-hydrochlorothiazide 37.5-25 MG per capsule   Commonly known as: DYAZIDE   Take 1 each (1 capsule total) by mouth every morning.        Follow-up Information    Follow up with Monica Pitter, MD. (as previously scheduled )    Contact information:   [address] [place] [address] [phone]           The results of significant diagnostics from this hospitalization (including imaging, microbiology, ancillary and laboratory) are listed below for reference.    Significant Diagnostic Studies: Ct Abdomen Pelvis Wo Contrast  04/16/2012  *RADIOLOGY REPORT*  Clinical Data: Continuous vomiting; possible small bowel obstruction.  CT ABDOMEN AND PELVIS WITHOUT CONTRAST  Technique:  Multidetector CT imaging of the abdomen and pelvis was performed following the standard protocol without intravenous contrast.  Comparison: CT of the abdomen and pelvis performed 04/12/2011, and abdominal radiograph performed 04/15/2012  Findings: Trace bilateral pleural effusions are noted.  Diffuse coronary artery calcifications are seen.  There is distension of small bowel loops to 4.4 cm in maximal diameter, extending to the level of the mid to distal ileum at the right hemipelvis.  The mid-ileum is intermittently distended, but there appears to be a transition point at the mid to distal ileum, with decompressed small bowel loops noted more distally.  This most likely reflects an adhesion.  A small amount of associated free fluid is noted tracking along the paracolic gutters and within the pelvis.  A small to moderate amount of stool is noted within the ascending and transverse colon; the descending and remaining sigmoid colon are largely decompressed, extending to a left lower quadrant colostomy. Diffuse diverticulosis is noted along the residual proximal sigmoid colon.  The liver and spleen are unremarkable in appearance.  The gallbladder is within normal limits.   The pancreas and adrenal glands are unremarkable.  Nonspecific perinephric stranding is noted bilaterally.  Scattered calcifications at the renal hila are thought to be vascular in nature, given diffuse vascular calcifications within the abdomen. There is dense calcification along the abdominal aorta, with associated ectasia of the mid abdominal aorta to 2.9 cm in diameter.  The appearance of the common iliac arteries suggests marked bilateral stenosis; this is also suggested at the level of the common femoral arteries bilaterally.  The appendix is normal in caliber, without evidence for appendicitis.  The bladder is mildly distended  and grossly unremarkable in appearance.  The uterus is grossly unremarkable.  No suspicious adnexal masses are seen.  No inguinal lymphadenopathy is seen.  No acute osseous abnormalities are identified.  IMPRESSION:  1.  Distension of small bowel loops to 4.4 cm in maximal diameter, with an apparent transition point at the mid to distal ileum in the right hemipelvis.  This most likely reflects an adhesion.  The intermittent distension of the mid ileum suggests against a very high-grade small bowel obstruction. 2.  Small amount of free fluid tracking along the paracolic gutters and within the pelvis. 3.  Dense calcification along the abdominal aorta, with ectasia of the mid abdominal aorta to 2.9 cm in diameter.  Marked bilateral stenosis noted along the common iliac arteries, and at the femoral arteries bilaterally. 4.  Trace bilateral pleural effusions noted. 5.  Diffuse coronary artery calcifications seen. 6.  Diffuse diverticulosis along the residual proximal sigmoid colon; the patient's left lower quadrant colostomy is unremarkable in appearance.  No evidence of diverticulitis.   Original Report Authenticated By: Tonia Ghent, M.D.    Dg Abd 2 Views  04/17/2012  *RADIOLOGY REPORT*  Clinical Data: Abdominal distention, weakness  ABDOMEN - 2 VIEW  Comparison: Abdomen films of  04/16/2012 and CT abdomen pelvis of the same date  Findings: An NG tube is present with the tip in the distal body of the stomach.  There has been significant improvement in small bowel distention, with some colonic bowel gas now present.  No evidence of persistent small bowel obstruction is seen.  No free air is noted on the erect view.  There are bilateral pleural effusions present with basilar atelectasis.  IMPRESSION:  1.  Significant improvement with apparent resolution of the small bowel obstruction. No free air. 2.  Bilateral pleural effusions.   Original Report Authenticated By: Dwyane Dee, M.D.    Dg Abd 2 Views  04/16/2012  *RADIOLOGY REPORT*  Clinical Data: Evaluate for small bowel obstruction  ABDOMEN - 2 VIEW  Comparison: 04/16/2012  Findings: There are dilated loops of small bowel up to 4.4 cm. Surgical clips within the pelvis.  Relative paucity of colonic gas. Hemidiaphragms are partially excluded from image.  There is suggestion of bibasilar opacities.  Diffuse osteopenia. Mild left hip degenerative change.  IMPRESSION: Small bowel obstruction pattern. Mild interval increased small bowel distension and decreased colonic gas.   Original Report Authenticated By: Jearld Lesch, M.D.    Dg Abd Acute W/chest  04/15/2012  *RADIOLOGY REPORT*  Clinical Data: Abdominal pain, nausea and vomiting.  ACUTE ABDOMEN SERIES (ABDOMEN 2 VIEW & CHEST 1 VIEW)  Comparison: Chest radiograph performed 04/17/2011, and abdominal radiograph performed 04/16/2011  Findings: The lungs are well-aerated and clear.  There is no evidence of focal opacification, pleural effusion or pneumothorax. The cardiomediastinal silhouette is mildly enlarged.  Scattered prominent air-fluid levels are noted within the small bowel, with distension of small bowel loops to 4.2 cm in maximal diameter, concerning for bowel obstruction.  Stool is noted within the visualized portions of the colon; the stomach is partially filled with fluid and  air.  No free intra-abdominal air is identified on the provided upright view.  No acute osseous abnormalities are seen; the sacroiliac joints are unremarkable in appearance.  Scattered clips are noted within the abdomen and pelvis.  IMPRESSION:  1.  Prominent air-fluid levels within the small bowel, with distension of small bowel loops to 4.2 cm in maximal diameter, concerning for bowel obstruction.  Stool  noted within the colon; no free intra-abdominal air seen. 2.  Lungs clear; mild cardiomegaly noted.  These results were called by telephone on 04/15/2012 at 03:51 a.m. to Dr. Dione Booze, who verbally acknowledged these results.   Original Report Authenticated By: Tonia Ghent, M.D.     Microbiology: Recent Results (from the past 240 hour(s))  URINE CULTURE     Status: Normal   Collection Time   04/15/12  3:59 AM      Component Value Range Status Comment   Specimen Description URINE, CLEAN CATCH   Final    Special Requests ADDED 0431   Final    Culture  Setup Time 04/15/2012 05:10   Final    Colony Count NO GROWTH   Final    Culture NO GROWTH   Final    Report Status 04/16/2012 FINAL   Final      Labs: Basic Metabolic Panel:  Lab 04/18/12 1610 04/15/12 0451 04/15/12 0257  NA 142 -- 138  K 2.7* -- 4.3  CL 101 -- 99  CO2 36* -- 30  GLUCOSE 114* -- 149*  BUN 31* -- 38*  CREATININE 0.92 1.15* 1.14*  CALCIUM 8.1* -- 10.3  MG -- -- --  PHOS -- -- --   Liver Function Tests:  Lab 04/15/12 0257  AST 19  ALT <5  ALKPHOS 42  BILITOT 0.3  PROT 7.5  ALBUMIN 3.9    Lab 04/15/12 0257  LIPASE 82*  AMYLASE --   No results found for this basename: AMMONIA:5 in the last 168 hours CBC:  Lab 04/18/12 0515 04/17/12 1010 04/15/12 0451 04/15/12 0257  WBC 7.8 8.6 12.9* 13.3*  NEUTROABS -- -- -- 10.5*  HGB 10.2* 12.1 12.8 14.0  HCT 32.1* 37.3 39.2 41.7  MCV 87.9 88.0 87.1 86.7  PLT 175 182 211 224   Cardiac Enzymes: No results found for this basename:  CKTOTAL:5,CKMB:5,CKMBINDEX:5,TROPONINI:5 in the last 168 hours BNP: BNP (last 3 results) No results found for this basename: PROBNP:3 in the last 8760 hours CBG:  Lab 04/17/12 1633 04/17/12 0729 04/17/12 0439 04/16/12 2113 04/16/12 1622  GLUCAP 104* 102* 115* 104* 118*       Signed:  Alvetta Hidrogo  [facility] 04/18/2012, 10:27 AM

## 2012-04-24 ENCOUNTER — Other Ambulatory Visit: Payer: Self-pay

## 2012-04-24 MED ORDER — METOPROLOL TARTRATE 50 MG PO TABS: 50.0000 mg | ORAL_TABLET | Freq: Two times a day (BID) | ORAL | Status: DC

## 2012-04-24 MED ORDER — ENALAPRIL MALEATE 20 MG PO TABS: 40.0000 mg | ORAL_TABLET | Freq: Every day | ORAL | Status: DC

## 2012-05-16 ENCOUNTER — Other Ambulatory Visit: Payer: Self-pay | Admitting: Cardiology

## 2012-05-16 DIAGNOSIS — I6529 Occlusion and stenosis of unspecified carotid artery: Secondary | ICD-10-CM

## 2012-05-17 ENCOUNTER — Encounter (INDEPENDENT_AMBULATORY_CARE_PROVIDER_SITE_OTHER): Payer: Medicare Other

## 2012-05-17 DIAGNOSIS — I6529 Occlusion and stenosis of unspecified carotid artery: Secondary | ICD-10-CM

## 2012-05-20 ENCOUNTER — Encounter: Payer: Self-pay | Admitting: Cardiology

## 2012-06-08 ENCOUNTER — Other Ambulatory Visit (HOSPITAL_COMMUNITY): Payer: Self-pay | Admitting: Internal Medicine

## 2013-11-10 ENCOUNTER — Other Ambulatory Visit: Payer: Self-pay | Admitting: Physician Assistant

## 2013-12-22 ENCOUNTER — Other Ambulatory Visit: Payer: 59 | Admitting: Physician Assistant

## 2014-05-21 ENCOUNTER — Encounter (HOSPITAL_COMMUNITY): Payer: Self-pay | Admitting: Cardiovascular Disease

## 2014-08-02 ENCOUNTER — Emergency Department (HOSPITAL_COMMUNITY): Payer: Medicare Other

## 2014-08-02 ENCOUNTER — Encounter (HOSPITAL_COMMUNITY): Payer: Self-pay

## 2014-08-02 ENCOUNTER — Inpatient Hospital Stay (HOSPITAL_COMMUNITY)
Admission: EM | Admit: 2014-08-02 | Discharge: 2014-08-11 | DRG: 292 | Disposition: A | Discharge status: Home / Self Care | Payer: Medicare Other | Attending: Internal Medicine | Admitting: Internal Medicine

## 2014-08-02 DIAGNOSIS — Z9049 Acquired absence of other specified parts of digestive tract: Secondary | ICD-10-CM

## 2014-08-02 DIAGNOSIS — I5021 Acute systolic (congestive) heart failure: Secondary | ICD-10-CM | POA: Diagnosis not present

## 2014-08-02 DIAGNOSIS — Z933 Colostomy status: Secondary | ICD-10-CM

## 2014-08-02 DIAGNOSIS — I319 Disease of pericardium, unspecified: Secondary | ICD-10-CM | POA: Diagnosis not present

## 2014-08-02 DIAGNOSIS — I252 Old myocardial infarction: Secondary | ICD-10-CM

## 2014-08-02 DIAGNOSIS — Z85038 Personal history of other malignant neoplasm of large intestine: Secondary | ICD-10-CM | POA: Diagnosis not present

## 2014-08-02 DIAGNOSIS — N289 Disorder of kidney and ureter, unspecified: Secondary | ICD-10-CM | POA: Diagnosis present

## 2014-08-02 DIAGNOSIS — I340 Nonrheumatic mitral (valve) insufficiency: Secondary | ICD-10-CM | POA: Diagnosis present

## 2014-08-02 DIAGNOSIS — R0602 Shortness of breath: Secondary | ICD-10-CM

## 2014-08-02 DIAGNOSIS — I3139 Other pericardial effusion (noninflammatory): Secondary | ICD-10-CM | POA: Diagnosis present

## 2014-08-02 DIAGNOSIS — I313 Pericardial effusion (noninflammatory): Secondary | ICD-10-CM | POA: Diagnosis present

## 2014-08-02 DIAGNOSIS — I2510 Atherosclerotic heart disease of native coronary artery without angina pectoris: Secondary | ICD-10-CM | POA: Diagnosis present

## 2014-08-02 DIAGNOSIS — I509 Heart failure, unspecified: Secondary | ICD-10-CM

## 2014-08-02 DIAGNOSIS — Z515 Encounter for palliative care: Secondary | ICD-10-CM

## 2014-08-02 DIAGNOSIS — I4891 Unspecified atrial fibrillation: Secondary | ICD-10-CM | POA: Diagnosis not present

## 2014-08-02 DIAGNOSIS — I5041 Acute combined systolic (congestive) and diastolic (congestive) heart failure: Principal | ICD-10-CM | POA: Diagnosis present

## 2014-08-02 DIAGNOSIS — Z87440 Personal history of urinary (tract) infections: Secondary | ICD-10-CM | POA: Diagnosis not present

## 2014-08-02 DIAGNOSIS — F17210 Nicotine dependence, cigarettes, uncomplicated: Secondary | ICD-10-CM | POA: Diagnosis present

## 2014-08-02 DIAGNOSIS — Z66 Do not resuscitate: Secondary | ICD-10-CM | POA: Diagnosis present

## 2014-08-02 DIAGNOSIS — E785 Hyperlipidemia, unspecified: Secondary | ICD-10-CM | POA: Diagnosis present

## 2014-08-02 DIAGNOSIS — E119 Type 2 diabetes mellitus without complications: Secondary | ICD-10-CM | POA: Diagnosis present

## 2014-08-02 DIAGNOSIS — Z6821 Body mass index (BMI) 21.0-21.9, adult: Secondary | ICD-10-CM | POA: Diagnosis not present

## 2014-08-02 DIAGNOSIS — I739 Peripheral vascular disease, unspecified: Secondary | ICD-10-CM | POA: Diagnosis present

## 2014-08-02 DIAGNOSIS — R0600 Dyspnea, unspecified: Secondary | ICD-10-CM

## 2014-08-02 DIAGNOSIS — I951 Orthostatic hypotension: Secondary | ICD-10-CM | POA: Diagnosis present

## 2014-08-02 DIAGNOSIS — I129 Hypertensive chronic kidney disease with stage 1 through stage 4 chronic kidney disease, or unspecified chronic kidney disease: Secondary | ICD-10-CM | POA: Diagnosis present

## 2014-08-02 DIAGNOSIS — E440 Moderate protein-calorie malnutrition: Secondary | ICD-10-CM | POA: Diagnosis present

## 2014-08-02 DIAGNOSIS — N179 Acute kidney failure, unspecified: Secondary | ICD-10-CM | POA: Diagnosis present

## 2014-08-02 DIAGNOSIS — I4892 Unspecified atrial flutter: Secondary | ICD-10-CM | POA: Diagnosis present

## 2014-08-02 DIAGNOSIS — Z7982 Long term (current) use of aspirin: Secondary | ICD-10-CM | POA: Diagnosis not present

## 2014-08-02 DIAGNOSIS — Z888 Allergy status to other drugs, medicaments and biological substances status: Secondary | ICD-10-CM | POA: Diagnosis not present

## 2014-08-02 DIAGNOSIS — I10 Essential (primary) hypertension: Secondary | ICD-10-CM | POA: Diagnosis present

## 2014-08-02 DIAGNOSIS — J449 Chronic obstructive pulmonary disease, unspecified: Secondary | ICD-10-CM | POA: Diagnosis present

## 2014-08-02 DIAGNOSIS — Z7901 Long term (current) use of anticoagulants: Secondary | ICD-10-CM | POA: Diagnosis not present

## 2014-08-02 DIAGNOSIS — J9600 Acute respiratory failure, unspecified whether with hypoxia or hypercapnia: Secondary | ICD-10-CM

## 2014-08-02 DIAGNOSIS — N183 Chronic kidney disease, stage 3 (moderate): Secondary | ICD-10-CM | POA: Diagnosis present

## 2014-08-02 DIAGNOSIS — J811 Chronic pulmonary edema: Secondary | ICD-10-CM

## 2014-08-02 LAB — CBC WITH DIFFERENTIAL/PLATELET
BASOS ABS: 0 10*3/uL (ref 0.0–0.1)
Basophils Relative: 0 % (ref 0–1)
EOS PCT: 1 % (ref 0–5)
Eosinophils Absolute: 0.1 10*3/uL (ref 0.0–0.7)
HEMATOCRIT: 42.6 % (ref 36.0–46.0)
HEMOGLOBIN: 13.4 g/dL (ref 12.0–15.0)
LYMPHS ABS: 1.2 10*3/uL (ref 0.7–4.0)
Lymphocytes Relative: 18 % (ref 12–46)
MCH: 27.6 pg (ref 26.0–34.0)
MCHC: 31.5 g/dL (ref 30.0–36.0)
MCV: 87.8 fL (ref 78.0–100.0)
MONO ABS: 0.6 10*3/uL (ref 0.1–1.0)
MONOS PCT: 9 % (ref 3–12)
NEUTROS PCT: 72 % (ref 43–77)
Neutro Abs: 4.8 10*3/uL (ref 1.7–7.7)
Platelets: 194 10*3/uL (ref 150–400)
RBC: 4.85 MIL/uL (ref 3.87–5.11)
RDW: 14.7 % (ref 11.5–15.5)
WBC: 6.6 10*3/uL (ref 4.0–10.5)

## 2014-08-02 LAB — TROPONIN I
Troponin I: 0.04 ng/mL — ABNORMAL HIGH (ref <0.031)
Troponin I: 0.04 ng/mL — ABNORMAL HIGH (ref <0.031)

## 2014-08-02 LAB — COMPREHENSIVE METABOLIC PANEL
ALT: 9 U/L (ref 0–35)
AST: 25 U/L (ref 0–37)
Albumin: 2.9 g/dL — ABNORMAL LOW (ref 3.5–5.2)
Alkaline Phosphatase: 38 U/L — ABNORMAL LOW (ref 39–117)
Anion gap: 4 — ABNORMAL LOW (ref 5–15)
BUN: 41 mg/dL — AB (ref 6–23)
CO2: 34 mmol/L — ABNORMAL HIGH (ref 19–32)
Calcium: 9.1 mg/dL (ref 8.4–10.5)
Chloride: 103 mmol/L (ref 96–112)
Creatinine, Ser: 1.28 mg/dL — ABNORMAL HIGH (ref 0.50–1.10)
GFR calc non Af Amer: 37 mL/min — ABNORMAL LOW (ref >90)
GFR, EST AFRICAN AMERICAN: 43 mL/min — AB (ref >90)
GLUCOSE: 124 mg/dL — AB (ref 70–99)
POTASSIUM: 3.9 mmol/L (ref 3.5–5.1)
SODIUM: 141 mmol/L (ref 135–145)
Total Bilirubin: 0.4 mg/dL (ref 0.3–1.2)
Total Protein: 6 g/dL (ref 6.0–8.3)

## 2014-08-02 LAB — DIGOXIN LEVEL: Digoxin Level: 0.8 ng/mL (ref 0.8–2.0)

## 2014-08-02 LAB — BRAIN NATRIURETIC PEPTIDE: B Natriuretic Peptide: 2079.5 pg/mL — ABNORMAL HIGH (ref 0.0–100.0)

## 2014-08-02 MED ORDER — METOPROLOL TARTRATE 25 MG PO TABS
25.0000 mg | ORAL_TABLET | Freq: Two times a day (BID) | ORAL | Status: DC
  Administered 2014-08-02: 25 mg via ORAL
  Filled 2014-08-02 (×3): qty 1

## 2014-08-02 MED ORDER — FUROSEMIDE 10 MG/ML IJ SOLN
40.0000 mg | Freq: Two times a day (BID) | INTRAMUSCULAR | Status: DC
  Administered 2014-08-03 – 2014-08-05 (×5): 40 mg via INTRAVENOUS
  Filled 2014-08-02 (×7): qty 4

## 2014-08-02 MED ORDER — ONDANSETRON HCL 4 MG/2ML IJ SOLN
4.0000 mg | Freq: Four times a day (QID) | INTRAMUSCULAR | Status: DC | PRN
  Administered 2014-08-03 – 2014-08-04 (×2): 4 mg via INTRAVENOUS
  Filled 2014-08-02 (×2): qty 2

## 2014-08-02 MED ORDER — LINACLOTIDE 145 MCG PO CAPS
145.0000 ug | ORAL_CAPSULE | Freq: Every day | ORAL | Status: DC
  Administered 2014-08-03 – 2014-08-11 (×9): 145 ug via ORAL
  Filled 2014-08-02 (×9): qty 1

## 2014-08-02 MED ORDER — SODIUM CHLORIDE 0.9 % IV SOLN: 250.0000 mL | INTRAVENOUS | Status: DC | PRN

## 2014-08-02 MED ORDER — ACETAMINOPHEN 325 MG PO TABS: 650.0000 mg | ORAL_TABLET | ORAL | Status: DC | PRN

## 2014-08-02 MED ORDER — SODIUM CHLORIDE 0.9 % IJ SOLN: 3.0000 mL | INTRAMUSCULAR | Status: DC | PRN

## 2014-08-02 MED ORDER — DIGOXIN 125 MCG PO TABS
125.0000 ug | ORAL_TABLET | Freq: Every day | ORAL | Status: DC
  Filled 2014-08-02: qty 1

## 2014-08-02 MED ORDER — CLONIDINE HCL 0.2 MG PO TABS
0.2000 mg | ORAL_TABLET | Freq: Two times a day (BID) | ORAL | Status: DC
  Administered 2014-08-02: 0.2 mg via ORAL
  Filled 2014-08-02 (×3): qty 1

## 2014-08-02 MED ORDER — FUROSEMIDE 10 MG/ML IJ SOLN
80.0000 mg | Freq: Once | INTRAMUSCULAR | Status: AC
  Administered 2014-08-02: 80 mg via INTRAVENOUS
  Filled 2014-08-02: qty 8

## 2014-08-02 MED ORDER — INFLUENZA VAC SPLIT QUAD 0.5 ML IM SUSY
0.5000 mL | PREFILLED_SYRINGE | INTRAMUSCULAR | Status: AC
  Administered 2014-08-03: 0.5 mL via INTRAMUSCULAR
  Filled 2014-08-02: qty 0.5

## 2014-08-02 MED ORDER — SODIUM CHLORIDE 0.9 % IJ SOLN
3.0000 mL | Freq: Two times a day (BID) | INTRAMUSCULAR | Status: DC
  Administered 2014-08-02 – 2014-08-11 (×17): 3 mL via INTRAVENOUS

## 2014-08-02 MED ORDER — ENOXAPARIN SODIUM 30 MG/0.3ML ~~LOC~~ SOLN
30.0000 mg | SUBCUTANEOUS | Status: DC
  Administered 2014-08-02: 30 mg via SUBCUTANEOUS
  Filled 2014-08-02 (×2): qty 0.3

## 2014-08-02 MED ORDER — DIGOXIN 125 MCG PO TABS
0.1250 mg | ORAL_TABLET | Freq: Every day | ORAL | Status: DC
Start: 2014-08-03 — End: 2014-08-03
  Filled 2014-08-02: qty 1

## 2014-08-02 NOTE — H&P (Signed)
[facility] History and Physical  Monica Rhodes YWV:[id number] DOB: [birth year] DOA: 08/02/2014  Referring physician: er PCP: Elyn Peers, MD   Chief Complaint: sob  HPI: Monica Rhodes is a 81 y.o. female  Who comes in with increasing SOB, orthopnea (2 pillows at night), and b/l swelling in her feet.  Symptoms started in January after PCP changed home meds- took of lasix per family.  She did notice LE swelling that has been worsening.  She is on digoxin and appears to have a fib but not recorded in chart on previous history.   No fevers, no chest pain  In the Er, she was found to have pleural effusions and en elevated BNP.  Echo from 2007 had a preserved EF. She was given IV lasix in ER and hospitalist were asked to admit for CHF work up    Normally she lives with son and walks with a cane for support, since the swelling, she has been unable to do so   Review of Systems:  All systems reviewed, negative unless stated above   Past Medical History  Diagnosis Date  . Hypertension   . UTI (urinary tract infection)   . SBO (small bowel obstruction)     Recurrent, prolonged hospitalization November, 2012  . Protein calorie malnutrition   . Hyperlipidemia   . DM (diabetes mellitus)   . COPD (chronic obstructive pulmonary disease)   . PVD (peripheral vascular disease)   . Colon cancer   . Bradycardia   . Tobacco abuse   . CAD (coronary artery disease)     Non-STEMI November, 2012,, Cardiac catheterization was attempted, despite significant efforts catheters could not be progressed to the coronary arteries ., Therefore medical therapy  . Carotid artery disease     Doppler  April 16, 2011,, total occlusion LICA, 85-46% R. ICA  . PAD (peripheral artery disease)     Suggestions significant stenosis distal aorta by CT angiogram  November, 2012  . Ejection fraction     EF 60%, echo, November, 2012,  . Mitral regurgitation     Moderate, echo, November, 2012  . Right  ventricular dysfunction     Moderate, echo, November, 2012  . Edema     December, 2012  . Rash     Rash  probably related to hydralazine  December, 2012   Past Surgical History  Procedure Laterality Date  . Colostomy    . Abdominoperineal proctocolectomy    . Left heart catheterization with coronary angiogram N/A 04/24/2011    Procedure: LEFT HEART CATHETERIZATION WITH CORONARY ANGIOGRAM;  Surgeon: Josue Hector, MD;  Location: [facility] CATH LAB;  Service: Cardiovascular;  Laterality: N/A;   Social History:  reports that she has been smoking Cigarettes.  She has been smoking about 0.50 packs per day. She does not have any smokeless tobacco history on file. She reports that she does not drink alcohol or use illicit drugs.  Allergies  Allergen Reactions  . Promethazine Other (See Comments)    Unknown     Family History  Problem Relation Age of Onset  . Coronary artery disease      Prior to Admission medications   Medication Sig Start Date End Date Taking? Authorizing Provider  Chlorpheniramine-Acetaminophen (CORICIDIN HBP COLD/FLU PO) Take 1 tablet by mouth daily as needed (cold).   Yes Historical Provider, MD  cloNIDine (CATAPRES) 0.2 MG tablet Take 0.2 mg by mouth 2 (two) times daily.   Yes Historical Provider, MD  digoxin (LANOXIN) 0.125 MG tablet  Take 125 mcg by mouth daily.     Yes Historical Provider, MD  fenofibrate 160 MG tablet Take 160 mg by mouth daily.     Yes Historical Provider, MD  LINZESS 145 MCG CAPS capsule Take 145 mcg by mouth daily. 07/19/14  Yes Historical Provider, MD  metoprolol (LOPRESSOR) 50 MG tablet Take 1 tablet (50 mg total) by mouth 2 (two) times daily. Patient taking differently: Take 25 mg by mouth 2 (two) times daily.  04/24/12  Yes Carlena Bjornstad, MD  Olmesartan-Amlodipine-HCTZ 40-5-25 MG TABS Take 1 tablet by mouth daily.   Yes Historical Provider, MD  amLODipine (NORVASC) 10 MG tablet Take 1 tablet (10 mg total) by mouth daily. Patient not taking:  Reported on 08/02/2014 04/18/12   Sorin June Leap, MD  enalapril (VASOTEC) 20 MG tablet Take 2 tablets (40 mg total) by mouth daily. Patient not taking: Reported on 08/02/2014 04/24/12   Carlena Bjornstad, MD  nitroGLYCERIN (NITROSTAT) 0.4 MG SL tablet Place 0.4 mg under the tongue every 5 (five) minutes as needed. For chest pain 04/27/11 04/26/12  Lanier Ensign, MD  potassium chloride SA (K-DUR,KLOR-CON) 20 MEQ tablet Take 1 tablet (20 mEq total) by mouth 2 (two) times daily. Patient not taking: Reported on 08/02/2014 04/18/12 04/25/12  Sorin June Leap, MD  triamterene-hydrochlorothiazide (DYAZIDE) 37.5-25 MG per capsule Take 1 each (1 capsule total) by mouth every morning. Patient not taking: Reported on 08/02/2014 04/18/12   Sorin June Leap, MD   Physical Exam: Filed Vitals:   08/02/14 1630 08/02/14 1645 08/02/14 1700 08/02/14 1715  BP: 140/65 161/76 152/79 173/66  Pulse: 48 60 77 59  Temp:      TempSrc:      Resp: 29 23 24 19   SpO2: 96% 96% 96% 96%    Wt Readings from Last 3 Encounters:  04/18/12 56.382 kg (124 lb 4.8 oz)  11/28/11 54.341 kg (119 lb 12.8 oz)  08/01/11 53.978 kg (119 lb)    General: on O2 Eyes: PERRL, normal lids, irises & conjunctiva ENT: grossly normal hearing, lips & tongue Neck: no LAD, masses or thyromegaly Cardiovascular: irregular, +LE edema Respiratory: Crackles b/l, diminished at bases,  no wheezing Abdomen: soft, ntnd Skin: no rash or induration seen on limited exam Musculoskeletal: grossly normal tone BUE/BLE Psychiatric: grossly normal mood and affect, speech fluent and appropriate Neurologic: grossly non-focal.          Labs on Admission:  Basic Metabolic Panel:  Recent Labs Lab 08/02/14 1402  NA 141  K 3.9  CL 103  CO2 34*  GLUCOSE 124*  BUN 41*  CREATININE 1.28*  CALCIUM 9.1   Liver Function Tests:  Recent Labs Lab 08/02/14 1402  AST 25  ALT 9  ALKPHOS 38*  BILITOT 0.4  PROT 6.0  ALBUMIN 2.9*   No results for input(s): LIPASE,  AMYLASE in the last 168 hours. No results for input(s): AMMONIA in the last 168 hours. CBC:  Recent Labs Lab 08/02/14 1402  WBC 6.6  NEUTROABS 4.8  HGB 13.4  HCT 42.6  MCV 87.8  PLT 194   Cardiac Enzymes:  Recent Labs Lab 08/02/14 1402  TROPONINI 0.04*    BNP (last 3 results)  Recent Labs  08/02/14 1407  BNP 2079.5*    ProBNP (last 3 results) No results for input(s): PROBNP in the last 8760 hours.  CBG: No results for input(s): GLUCAP in the last 168 hours.  Radiological Exams on Admission: Dg Chest 2 View  08/02/2014  CLINICAL DATA:  High blood pressure and shortness of breath for 2 days.  EXAM: CHEST  2 VIEW  COMPARISON:  04/17/2011  FINDINGS: The heart is enlarged. There is tortuosity, ectasia and calcification of the thoracic aorta. There are bilateral pleural effusions, right greater than left and interstitial pulmonary edema. The bony thorax is intact  IMPRESSION: CHF with bilateral effusions, right greater than left.   Electronically Signed   By: Marijo Sanes M.D.   On: 08/02/2014 15:14    EKG: Independently reviewed. Atrial fib  Assessment/Plan Active Problems:   History of colectomy   Hypertension   CHF (congestive heart failure)   Atrial fibrillation   prob CHF- lasix, echo, cycle CE, cards consult if EF decreased, daily weights, strict I/Os BMP elevated  AKI -? Baseline -monitor while on lasix  HTN- continue home meds  Atrial fib- on dig, rate controlled  H/o colectomy  Acute resp failure- wean O2 as tolerated   Code Status: full DVT Prophylaxis: Family Communication: sons at bedside Disposition Plan:   Time spent: 59 min  Eulogio Bear [facility] Pager [phone]

## 2014-08-02 NOTE — ED Provider Notes (Signed)
CSN: [id number]     Arrival date & time 08/02/14  1337 History   First MD Initiated Contact with Patient 08/02/14 1340     Chief Complaint  Patient presents with  . Shortness of Breath  . Leg Swelling     HPI Patient is brought to the emergency department for increasing shortness of breath and orthopnea over the past several days.  Patient previously was on Lasix recently had this stopped 2 weeks ago.  This was completed by her primary care physician.  She is on digoxin.  She presents today in atrial fibrillation.  She denies active chest pain.  She does have a history of coronary artery disease.  She is to follow with Dr. Ron Parker but has not seen him in several years.  She denies cough or congestion.  No fevers or chills.  Past Medical History  Diagnosis Date  . Hypertension   . UTI (urinary tract infection)   . SBO (small bowel obstruction)     Recurrent, prolonged hospitalization November, 2012  . Protein calorie malnutrition   . Hyperlipidemia   . DM (diabetes mellitus)   . COPD (chronic obstructive pulmonary disease)   . PVD (peripheral vascular disease)   . Colon cancer   . Bradycardia   . Tobacco abuse   . CAD (coronary artery disease)     Non-STEMI November, 2012,, Cardiac catheterization was attempted, despite significant efforts catheters could not be progressed to the coronary arteries ., Therefore medical therapy  . Carotid artery disease     Doppler  April 16, 2011,, total occlusion LICA, 21-22% R. ICA  . PAD (peripheral artery disease)     Suggestions significant stenosis distal aorta by CT angiogram  November, 2012  . Ejection fraction     EF 60%, echo, November, 2012,  . Mitral regurgitation     Moderate, echo, November, 2012  . Right ventricular dysfunction     Moderate, echo, November, 2012  . Edema     December, 2012  . Rash     Rash  probably related to hydralazine  December, 2012   Past Surgical History  Procedure Laterality Date  . Colostomy    .  Abdominoperineal proctocolectomy    . Left heart catheterization with coronary angiogram N/A 04/24/2011    Procedure: LEFT HEART CATHETERIZATION WITH CORONARY ANGIOGRAM;  Surgeon: Josue Hector, MD;  Location: [facility] CATH LAB;  Service: Cardiovascular;  Laterality: N/A;   Family History  Problem Relation Age of Onset  . Coronary artery disease     History  Substance Use Topics  . Smoking status: Current Every Day Smoker -- 0.50 packs/day    Types: Cigarettes  . Smokeless tobacco: Not on file  . Alcohol Use: No   OB History    No data available     Review of Systems  All other systems reviewed and are negative.     Allergies  Promethazine  Home Medications   Prior to Admission medications   Medication Sig Start Date End Date Taking? Authorizing Provider  amLODipine (NORVASC) 10 MG tablet Take 1 tablet (10 mg total) by mouth daily. 04/18/12   Sorin June Leap, MD  digoxin (LANOXIN) 0.125 MG tablet Take 125 mcg by mouth daily.      Historical Provider, MD  enalapril (VASOTEC) 20 MG tablet Take 2 tablets (40 mg total) by mouth daily. 04/24/12   Carlena Bjornstad, MD  fenofibrate 160 MG tablet Take 160 mg by mouth daily.  Historical Provider, MD  metoprolol (LOPRESSOR) 50 MG tablet Take 1 tablet (50 mg total) by mouth 2 (two) times daily. 04/24/12   Carlena Bjornstad, MD  nitroGLYCERIN (NITROSTAT) 0.4 MG SL tablet Place 0.4 mg under the tongue every 5 (five) minutes as needed. For chest pain 04/27/11 04/26/12  Lanier Ensign, MD  ondansetron (ZOFRAN) 8 MG tablet Take 8 mg by mouth every 8 (eight) hours as needed. For nausea    Historical Provider, MD  potassium chloride SA (K-DUR,KLOR-CON) 20 MEQ tablet Take 1 tablet (20 mEq total) by mouth 2 (two) times daily. 04/18/12 04/25/12  Sorin June Leap, MD  prazosin (MINIPRESS) 2 MG capsule Take 2 mg by mouth 2 (two) times daily.      Historical Provider, MD  triamterene-hydrochlorothiazide (DYAZIDE) 37.5-25 MG per capsule Take 1 each (1 capsule  total) by mouth every morning. 04/18/12   Sorin June Leap, MD   BP 168/74 mmHg  Pulse 85  Temp(Src) 98 F (36.7 C) (Oral)  Resp 16  SpO2 96% Physical Exam  Constitutional: She is oriented to person, place, and time. She appears well-developed and well-nourished. No distress.  HENT:  Head: Normocephalic and atraumatic.  Eyes: EOM are normal.  Neck: Normal range of motion.  Cardiovascular: Normal rate, regular rhythm and normal heart sounds.   Pulmonary/Chest: Effort normal. She has rales.  Abdominal: Soft. She exhibits no distension. There is no tenderness.  Musculoskeletal: Normal range of motion. She exhibits edema.  2+ edema bilaterally  Neurological: She is alert and oriented to person, place, and time.  Skin: Skin is warm and dry.  Psychiatric: She has a normal mood and affect. Judgment normal.  Nursing note and vitals reviewed.   ED Course  Procedures (including critical care time) Labs Review Labs Reviewed  COMPREHENSIVE METABOLIC PANEL - Abnormal; Notable for the following:    CO2 34 (*)    Glucose, Bld 124 (*)    BUN 41 (*)    Creatinine, Ser 1.28 (*)    Albumin 2.9 (*)    Alkaline Phosphatase 38 (*)    GFR calc non Af Amer 37 (*)    GFR calc Af Amer 43 (*)    Anion gap 4 (*)    All other components within normal limits  BRAIN NATRIURETIC PEPTIDE - Abnormal; Notable for the following:    B Natriuretic Peptide 2079.5 (*)    All other components within normal limits  TROPONIN I - Abnormal; Notable for the following:    Troponin I 0.04 (*)    All other components within normal limits  CBC WITH DIFFERENTIAL/PLATELET  DIGOXIN LEVEL    Imaging Review Dg Chest 2 View  08/02/2014   CLINICAL DATA:  High blood pressure and shortness of breath for 2 days.  EXAM: CHEST  2 VIEW  COMPARISON:  04/17/2011  FINDINGS: The heart is enlarged. There is tortuosity, ectasia and calcification of the thoracic aorta. There are bilateral pleural effusions, right greater than left and  interstitial pulmonary edema. The bony thorax is intact  IMPRESSION: CHF with bilateral effusions, right greater than left.   Electronically Signed   By: Marijo Sanes M.D.   On: 08/02/2014 15:14  I personally reviewed the imaging tests through PACS system I reviewed available ER/hospitalization records through the EMR    EKG Interpretation   Date/Time:  Sunday August 02 2014 13:37:42 EST Ventricular Rate:  88 PR Interval:    QRS Duration: 104 QT Interval:  354 QTC Calculation: 428 R  Axis:   42 Text Interpretation:  Atrial fibrillation Ventricular premature complex  LVH with secondary repolarization abnormality new since prior ecg in May  2013 Confirmed by Icie Kuznicki  MD, Lennette Bihari ([id number]) on 08/02/2014 2:16:10 PM      MDM   Final diagnoses:  Congestive heart failure, unspecified congestive heart failure chronicity, unspecified congestive heart failure type   Consistent with congestive heart failure.  Will initiate IV Lasix at this time.  Patient need to be admitted to the hospital for IV diuresis given her bilateral pleural effusions and dyspnea on exertion.  I suspect that she's been in atrial fibrillation as she is on digoxin but her last EKG several years ago demonstrated normal sinus rhythm.  Not sure if this is chronic atrial fibrillation or paroxysmal atrial fibrillation     Hoy Morn, MD 08/02/14 [phone]

## 2014-08-02 NOTE — ED Notes (Signed)
Lab at the bedside 

## 2014-08-02 NOTE — ED Notes (Signed)
Pt returned from xray

## 2014-08-02 NOTE — ED Notes (Signed)
Dr. Campos at the bedside.  

## 2014-08-02 NOTE — ED Notes (Signed)
Per [facility], pt from home for SOB and increased swelling to bilateral feet. Pt states it isn't as bad as it normally is. Pt states she was more SOB last night and had to prop herself up last night to sleep. Pt denies any pain. afib on the monitor. 22g to RFA

## 2014-08-03 DIAGNOSIS — I509 Heart failure, unspecified: Secondary | ICD-10-CM

## 2014-08-03 DIAGNOSIS — I5041 Acute combined systolic (congestive) and diastolic (congestive) heart failure: Secondary | ICD-10-CM | POA: Diagnosis not present

## 2014-08-03 DIAGNOSIS — I482 Chronic atrial fibrillation: Secondary | ICD-10-CM

## 2014-08-03 LAB — CBC WITH DIFFERENTIAL/PLATELET
Basophils Absolute: 0 10*3/uL (ref 0.0–0.1)
Basophils Relative: 0 % (ref 0–1)
Eosinophils Absolute: 0 10*3/uL (ref 0.0–0.7)
Eosinophils Relative: 1 % (ref 0–5)
HCT: 36.5 % (ref 36.0–46.0)
Hemoglobin: 11.4 g/dL — ABNORMAL LOW (ref 12.0–15.0)
LYMPHS ABS: 1.3 10*3/uL (ref 0.7–4.0)
Lymphocytes Relative: 20 % (ref 12–46)
MCH: 27.5 pg (ref 26.0–34.0)
MCHC: 31.2 g/dL (ref 30.0–36.0)
MCV: 88 fL (ref 78.0–100.0)
MONO ABS: 0.5 10*3/uL (ref 0.1–1.0)
Monocytes Relative: 8 % (ref 3–12)
NEUTROS ABS: 4.5 10*3/uL (ref 1.7–7.7)
Neutrophils Relative %: 71 % (ref 43–77)
Platelets: 185 10*3/uL (ref 150–400)
RBC: 4.15 MIL/uL (ref 3.87–5.11)
RDW: 14.8 % (ref 11.5–15.5)
WBC: 6.4 10*3/uL (ref 4.0–10.5)

## 2014-08-03 LAB — TROPONIN I
TROPONIN I: 0.04 ng/mL — AB (ref <0.031)
Troponin I: 0.05 ng/mL — ABNORMAL HIGH (ref <0.031)

## 2014-08-03 LAB — BASIC METABOLIC PANEL
Anion gap: 5 (ref 5–15)
BUN: 43 mg/dL — ABNORMAL HIGH (ref 6–23)
CHLORIDE: 101 mmol/L (ref 96–112)
CO2: 34 mmol/L — ABNORMAL HIGH (ref 19–32)
CREATININE: 1.5 mg/dL — AB (ref 0.50–1.10)
Calcium: 8.7 mg/dL (ref 8.4–10.5)
GFR calc Af Amer: 36 mL/min — ABNORMAL LOW (ref >90)
GFR calc non Af Amer: 31 mL/min — ABNORMAL LOW (ref >90)
Glucose, Bld: 172 mg/dL — ABNORMAL HIGH (ref 70–99)
POTASSIUM: 3.5 mmol/L (ref 3.5–5.1)
SODIUM: 140 mmol/L (ref 135–145)

## 2014-08-03 LAB — DIGOXIN LEVEL: Digoxin Level: 0.6 ng/mL — ABNORMAL LOW (ref 0.8–2.0)

## 2014-08-03 MED ORDER — METOPROLOL TARTRATE 12.5 MG HALF TABLET
12.5000 mg | ORAL_TABLET | Freq: Two times a day (BID) | ORAL | Status: DC
  Administered 2014-08-03 – 2014-08-04 (×2): 12.5 mg via ORAL
  Filled 2014-08-03 (×3): qty 1

## 2014-08-03 MED ORDER — ENOXAPARIN SODIUM 60 MG/0.6ML ~~LOC~~ SOLN
50.0000 mg | SUBCUTANEOUS | Status: DC
  Administered 2014-08-03 – 2014-08-04 (×2): 50 mg via SUBCUTANEOUS
  Filled 2014-08-03 (×3): qty 0.6

## 2014-08-03 MED ORDER — ADULT MULTIVITAMIN W/MINERALS CH
1.0000 | ORAL_TABLET | Freq: Every day | ORAL | Status: DC
Start: 2014-08-03 — End: 2014-08-11
  Administered 2014-08-03 – 2014-08-11 (×8): 1 via ORAL
  Filled 2014-08-03 (×9): qty 1

## 2014-08-03 MED ORDER — MAGNESIUM HYDROXIDE 400 MG/5ML PO SUSP
30.0000 mL | Freq: Every day | ORAL | Status: DC | PRN
  Administered 2014-08-03: 30 mL via ORAL
  Filled 2014-08-03: qty 30

## 2014-08-03 MED ORDER — ISOSORBIDE MONONITRATE ER 30 MG PO TB24
30.0000 mg | ORAL_TABLET | Freq: Every day | ORAL | Status: DC
  Administered 2014-08-03 – 2014-08-11 (×9): 30 mg via ORAL
  Filled 2014-08-03 (×9): qty 1

## 2014-08-03 MED ORDER — GLUCERNA SHAKE PO LIQD
237.0000 mL | Freq: Three times a day (TID) | ORAL | Status: DC
  Administered 2014-08-04 – 2014-08-10 (×15): 237 mL via ORAL

## 2014-08-03 MED ORDER — HYDRALAZINE HCL 50 MG PO TABS
50.0000 mg | ORAL_TABLET | Freq: Four times a day (QID) | ORAL | Status: DC
  Administered 2014-08-03 – 2014-08-10 (×28): 50 mg via ORAL
  Filled 2014-08-03 (×32): qty 1

## 2014-08-03 MED ORDER — CETYLPYRIDINIUM CHLORIDE 0.05 % MT LIQD
7.0000 mL | Freq: Two times a day (BID) | OROMUCOSAL | Status: DC
  Administered 2014-08-03 – 2014-08-11 (×14): 7 mL via OROMUCOSAL

## 2014-08-03 MED ORDER — AMLODIPINE BESYLATE 10 MG PO TABS
10.0000 mg | ORAL_TABLET | Freq: Every day | ORAL | Status: DC
  Administered 2014-08-03 – 2014-08-11 (×9): 10 mg via ORAL
  Filled 2014-08-03 (×9): qty 1

## 2014-08-03 MED ORDER — HYDROCHLOROTHIAZIDE 25 MG PO TABS
25.0000 mg | ORAL_TABLET | Freq: Every day | ORAL | Status: DC
  Administered 2014-08-03 – 2014-08-04 (×2): 25 mg via ORAL
  Filled 2014-08-03 (×2): qty 1

## 2014-08-03 MED ORDER — ISOSORBIDE MONONITRATE ER 30 MG PO TB24: 30.0000 mg | ORAL_TABLET | Freq: Every day | ORAL | Status: DC

## 2014-08-03 MED ORDER — HYDRALAZINE HCL 50 MG PO TABS
50.0000 mg | ORAL_TABLET | Freq: Four times a day (QID) | ORAL | Status: DC
  Filled 2014-08-03 (×4): qty 1

## 2014-08-03 NOTE — Progress Notes (Signed)
Echocardiogram 2D Echocardiogram has been performed.  Joelene Millin 08/03/2014, 4:46 PM

## 2014-08-03 NOTE — Progress Notes (Signed)
0818 Dr Wynelle Cleveland put orders in >carried out

## 2014-08-03 NOTE — Progress Notes (Signed)
1139 Pt compaiedt of not feeling .claimed feeling feeling dizzy .Bedrest done . Vs taken and charted macular degeneration notified

## 2014-08-03 NOTE — Progress Notes (Signed)
0800 Around the time CMT  reported that pt had run of [place] non sustain the patient asleep , arousable ,HOH , ,otiented ,denied any palpitation ,sob or dizziness .  0737 reevaluated by [facility] , vs taken and charted . Placed a call to MD.Aknowledged

## 2014-08-03 NOTE — Progress Notes (Signed)
Patient alert and oriented, HOH, HR in the 40 to 50s during the night while asleep, remained asymptomatic, will continue to monitor

## 2014-08-03 NOTE — Progress Notes (Signed)
Pt feeling much better now after had vooide VS retaken and charted

## 2014-08-03 NOTE — Progress Notes (Signed)
ANTICOAGULATION CONSULT NOTE - Initial Consult  Pharmacy Consult for lovenox Indication: atrial fibrillation  Allergies  Allergen Reactions  . Promethazine Other (See Comments)    Unknown     Patient Measurements: Height: 5\' 1"  (154.9 cm) Weight: 115 lb 2.3 oz (52.23 kg) (Scale B) IBW/kg (Calculated) : 47.8   Vital Signs: Temp: 97.7 F (36.5 C) (02/22 1335) Temp Source: Oral (02/22 1335) BP: 165/65 mmHg (02/22 1335) Pulse Rate: 95 (02/22 1335)  Labs:  Recent Labs  08/02/14 1402 08/02/14 1934 08/03/14 0010 08/03/14 0508  HGB 13.4  --  11.4*  --   HCT 42.6  --  36.5  --   PLT 194  --  185  --   CREATININE 1.28*  --  1.50*  --   TROPONINI 0.04* 0.04* 0.04* 0.05*    Estimated Creatinine Clearance: 21.1 mL/min (by C-G formula based on Cr of 1.5).   Medical History: Past Medical History  Diagnosis Date  . Hypertension   . UTI (urinary tract infection)   . SBO (small bowel obstruction)     Recurrent, prolonged hospitalization November, 2012  . Protein calorie malnutrition   . Hyperlipidemia   . DM (diabetes mellitus)   . COPD (chronic obstructive pulmonary disease)   . PVD (peripheral vascular disease)   . Colon cancer   . Bradycardia   . Tobacco abuse   . CAD (coronary artery disease)     Non-STEMI November, 2012,, Cardiac catheterization was attempted, despite significant efforts catheters could not be progressed to the coronary arteries ., Therefore medical therapy  . Carotid artery disease     Doppler  April 16, 2011,, total occlusion LICA, 26-20% R. ICA  . PAD (peripheral artery disease)     Suggestions significant stenosis distal aorta by CT angiogram  November, 2012  . Ejection fraction     EF 60%, echo, November, 2012,  . Mitral regurgitation     Moderate, echo, November, 2012  . Right ventricular dysfunction     Moderate, echo, November, 2012  . Edema     December, 2012  . Rash     Rash  probably related to hydralazine  December, 2012     Medications:  Prescriptions prior to admission  Medication Sig Dispense Refill Last Dose  . Chlorpheniramine-Acetaminophen (CORICIDIN HBP COLD/FLU PO) Take 1 tablet by mouth daily as needed (cold).   08/01/2014 at Unknown time  . cloNIDine (CATAPRES) 0.2 MG tablet Take 0.2 mg by mouth 2 (two) times daily.   08/02/2014 at Unknown time  . digoxin (LANOXIN) 0.125 MG tablet Take 125 mcg by mouth daily.     08/02/2014 at Unknown time  . fenofibrate 160 MG tablet Take 160 mg by mouth daily.     08/02/2014 at Unknown time  . LINZESS 145 MCG CAPS capsule Take 145 mcg by mouth daily.  4 08/02/2014 at Unknown time  . metoprolol (LOPRESSOR) 50 MG tablet Take 1 tablet (50 mg total) by mouth 2 (two) times daily. (Patient taking differently: Take 25 mg by mouth 2 (two) times daily. ) 60 tablet 1 08/02/2014 at 0700  . Olmesartan-Amlodipine-HCTZ 40-5-25 MG TABS Take 1 tablet by mouth daily.   08/02/2014 at Unknown time  . amLODipine (NORVASC) 10 MG tablet Take 1 tablet (10 mg total) by mouth daily. (Patient not taking: Reported on 08/02/2014) 30 tablet 0 Not Taking at Unknown time  . enalapril (VASOTEC) 20 MG tablet Take 2 tablets (40 mg total) by mouth daily. (Patient not taking: Reported  on 08/02/2014) 60 tablet 1 Not Taking at Unknown time  . nitroGLYCERIN (NITROSTAT) 0.4 MG SL tablet Place 0.4 mg under the tongue every 5 (five) minutes as needed. For chest pain   unk  . potassium chloride SA (K-DUR,KLOR-CON) 20 MEQ tablet Take 1 tablet (20 mEq total) by mouth 2 (two) times daily. (Patient not taking: Reported on 08/02/2014) 14 tablet 0   . triamterene-hydrochlorothiazide (DYAZIDE) 37.5-25 MG per capsule Take 1 each (1 capsule total) by mouth every morning. (Patient not taking: Reported on 08/02/2014) 30 capsule 0 Not Taking at Unknown time   Scheduled:  . amLODipine  10 mg Oral Daily  . antiseptic oral rinse  7 mL Mouth Rinse BID  . furosemide  40 mg Intravenous BID  . hydrALAZINE  50 mg Oral 4 times per day  .  hydrochlorothiazide  25 mg Oral Daily  . Influenza vac split quadrivalent PF  0.5 mL Intramuscular Tomorrow-1000  . isosorbide mononitrate  30 mg Oral Daily  . Linaclotide  145 mcg Oral Daily  . metoprolol tartrate  12.5 mg Oral BID  . sodium chloride  3 mL Intravenous Q12H    Assessment: 81 yo female with new afib (CHADSVASC= 7; HTN, age, female, PVD, HTN, DM, HF) to begin lovenox. SCr= 1.5 (SCr was 0.92-1.15 in 04/2012), hg/hct= 11.4/36.5, plt= 185.   Goal of Therapy:  Monitor platelets by anticoagulation protocol: Yes   Plan:  -Lovenox 50mg  [place] q24hr -CBC every 3 days -Will watch CBC and SCr trend -Will follow anticoagulation plans   Hildred Laser, Pharm D 08/03/2014 2:35 PM

## 2014-08-03 NOTE — Progress Notes (Signed)
Called for bradycardia and 2.16 sec pause. HT was in 59s. Plan: Hold Dig, Metoprolol and Clonidine this morning - start Hydralazine for HTN- resume Metoprolol this evening at 12.5 mg rather than 25mg .  Dig level checked and was low normal yesterday - will recheck. Cont to follow on telemetry.   Debbe Odea, MD

## 2014-08-03 NOTE — Progress Notes (Signed)
Called central telemetry monitoring department to do a census check. Was informed by monitor tech that patient's HR dropped to  39 bpm non-sustained and that she had a 2.16 pause. Informed patient's primary RN. Pt.was asymptomatic and was sleeping in bed. VS were taken. Paged Dr.Rizwan with [facility].

## 2014-08-03 NOTE — Clinical Documentation Improvement (Signed)
  81 year old white female admitted with CHF.  "Acute Respiratory Failure" documented in current medical record.  02 requirements this admission - 2 liters nasal cannula.  Respiratory Rate has ranged in the 20's on average since admission.  Please document clinical indicators to support the diagnosis of acute respiratory failure if this diagnosis is applicable to this admission.   Thank You, Erling Conte ,RN Clinical Documentation Specialist:  [phone] [place] Information Management

## 2014-08-03 NOTE — Progress Notes (Signed)
INITIAL NUTRITION ASSESSMENT  DOCUMENTATION CODES Per approved criteria  -Non-severe (moderate) malnutrition in the context of chronic illness  Pt meets criteria for MODERATE MALNUTRITION in the context of CHRONIC ILNESS as evidenced by moderate loss of subcutaneous fat mass and moderate loss of muscle mass.  INTERVENTION: Provide Glucerna Shakes TID after meals, each supplement provides 220 kcal and 10 grams of protein Provide Multivitamin with minerals daily Encourage PO intake  NUTRITION DIAGNOSIS: Malnutrition related to chronic illness and poor PO intake as evidenced by moderate to severe wasting.   Goal: Pt to meet >/= 90% of their estimated nutrition needs   Monitor:  PO intake, supplement acceptance, I/O's weight trend, labs  Reason for Assessment: Malnutrition Screening Tool, score of 2  81 y.o. female  Admitting Dx: CHF (congestive heart failure)  ASSESSMENT: 81 y.o. Female with history of diabetes and COPD, comes in with increasing SOB, orthopnea, and bilateral swelling in her feet.   Patient reports having a decreased appetite for the past couple months. She started drinking Boost once daily a few weeks ago due to weight loss. She reports a usual body weight of 130 lbs a few years ago but, unsure how much she has lost recently. She did not eat lunch due to her food getting cold; pt declined additional tray. RD brought pt a snack and a Glucerna Shake.   Labs: low GFR, elevated BUN, low albumin; Glucose ranging 114 to 172 mg/dL  Nutrition Focused Physical Exam:  Subcutaneous Fat:  Orbital Region: moderate wasting Upper Arm Region: moderate wasting Thoracic and Lumbar Region: moderate wasting  Muscle:  Temple Region: severe wasting Clavicle Bone Region: severe wasting Clavicle and Acromion Bone Region: moderate wasting Scapular Bone Region: mild wasting Dorsal Hand: moderate to severe wasting Patellar Region: moderate wasting Anterior Thigh Region: moderate  wasting Posterior Calf Region: NA  Edema: +2 RLE ede,a, +3 LLE edema   Height: Ht Readings from Last 1 Encounters:  08/02/14 5\' 1"  (1.549 m)    Weight: Wt Readings from Last 1 Encounters:  08/03/14 115 lb 2.3 oz (52.23 kg)    Ideal Body Weight: 105 lbs  % Ideal Body Weight: 109%  Wt Readings from Last 10 Encounters:  08/03/14 115 lb 2.3 oz (52.23 kg)  04/18/12 124 lb 4.8 oz (56.382 kg)  11/28/11 119 lb 12.8 oz (54.341 kg)  08/01/11 119 lb (53.978 kg)  06/09/11 125 lb (56.7 kg)  04/27/11 135 lb 12.9 oz (61.6 kg)    Usual Body Weight: 130 lbs  % Usual Body Weight: 88%  BMI:  Body mass index is 21.77 kg/(m^2).  Estimated Nutritional Needs: Kcal: 1500-1700 Protein: 75-85 grams Fluid: 1.5-1.7 L/day  Skin: intact  Diet Order: Diet Heart  EDUCATION NEEDS: -No education needs identified at this time   Intake/Output Summary (Last 24 hours) at 08/03/14 1520 Last data filed at 08/03/14 1420  Gross per 24 hour  Intake    760 ml  Output   2751 ml  Net  -1991 ml    Last BM: 2/21  Labs:   Recent Labs Lab 08/02/14 1402 08/03/14 0010  NA 141 140  K 3.9 3.5  CL 103 101  CO2 34* 34*  BUN 41* 43*  CREATININE 1.28* 1.50*  CALCIUM 9.1 8.7  GLUCOSE 124* 172*    CBG (last 3)  No results for input(s): GLUCAP in the last 72 hours.  Scheduled Meds: . amLODipine  10 mg Oral Daily  . antiseptic oral rinse  7 mL Mouth Rinse  BID  . enoxaparin (LOVENOX) injection  50 mg Subcutaneous Q24H  . furosemide  40 mg Intravenous BID  . hydrALAZINE  50 mg Oral 4 times per day  . hydrochlorothiazide  25 mg Oral Daily  . Influenza vac split quadrivalent PF  0.5 mL Intramuscular Tomorrow-1000  . isosorbide mononitrate  30 mg Oral Daily  . Linaclotide  145 mcg Oral Daily  . metoprolol tartrate  12.5 mg Oral BID  . sodium chloride  3 mL Intravenous Q12H    Continuous Infusions:   Past Medical History  Diagnosis Date  . Hypertension   . UTI (urinary tract infection)    . SBO (small bowel obstruction)     Recurrent, prolonged hospitalization November, 2012  . Protein calorie malnutrition   . Hyperlipidemia   . DM (diabetes mellitus)   . COPD (chronic obstructive pulmonary disease)   . PVD (peripheral vascular disease)   . Colon cancer   . Bradycardia   . Tobacco abuse   . CAD (coronary artery disease)     Non-STEMI November, 2012,, Cardiac catheterization was attempted, despite significant efforts catheters could not be progressed to the coronary arteries ., Therefore medical therapy  . Carotid artery disease     Doppler  April 16, 2011,, total occlusion LICA, 35-70% R. ICA  . PAD (peripheral artery disease)     Suggestions significant stenosis distal aorta by CT angiogram  November, 2012  . Ejection fraction     EF 60%, echo, November, 2012,  . Mitral regurgitation     Moderate, echo, November, 2012  . Right ventricular dysfunction     Moderate, echo, November, 2012  . Edema     December, 2012  . Rash     Rash  probably related to hydralazine  December, 2012    Past Surgical History  Procedure Laterality Date  . Colostomy    . Abdominoperineal proctocolectomy    . Left heart catheterization with coronary angiogram N/A 04/24/2011    Procedure: LEFT HEART CATHETERIZATION WITH CORONARY ANGIOGRAM;  Surgeon: Josue Hector, MD;  Location: [facility] CATH LAB;  Service: Cardiovascular;  Laterality: N/A;    Pryor Ochoa RD, LDN Inpatient Clinical Dietitian Pager: [phone] After Hours Pager: [phone]

## 2014-08-03 NOTE — Progress Notes (Signed)
[facility] Progress Note   Monica Rhodes ONG:[id number] DOB: [birth year] DOA: 08/02/2014 PCP: Elyn Peers, MD  Brief narrative: Monica Rhodes is a 81 y.o. female with HTN and A-fib presenting with dyspnea, orthopnea and severe pedal edema.    Subjective: No complaints- had a long discussion with her and her husband about her medications - they initially believed that she had been taking Lasix at home and this was discontinued about 1 mo ago and she was switched to [place], but after reviewing all of her medications (old and new) it appears she was previously on Triamterene/ HCTZ alone with Amlodipine (separately) which was switched to Tribenzor about 1 mo ago. The husband feels that her BP is much better controlled on Tribenzor that it had been in the past.   Assessment/Plan: Principal Problem:   CHF (congestive heart failure) - undetermined if systolic or diastolic- await ECHO - cont to diurese with Lasix and follow I and O carefully - wean O2 as able- currently on 2 L  Active Problems:  Mildly elevated Troponin - likely in related to CHF- no chest pain    Atrial flutter -presuming this is a new diagnosis- noted incidentally on EKG on admission - no h/o palpitations- no record of this in chart and no sign of it on old EKGs that are available in Epic - cont Metoprolol for rate control but at lower dose due to bradycardic episode  Bradycardia HR dropped into 30s and 2.19 sec pause noted on tele- hold Clonidine and Digoxin and decrease the dose of Metoprolol for now  Acute renal failure on CKD 3 - may be due to Lasix- cont to follow closely- holding ARB      Hypertension - apparently was difficult to control - unfortunately, due to acute renal failure, we have held the Tribenzor today- will cont Amlodipine and HCTZ - due to bradycardia, have had to hold Clonidine and cut back Metoprolol today as well - added Hydralazine in place of above mentioned held  medications    Code Status: full code Family Communication: husband Disposition Plan: home when stable-  DVT prophylaxis: start full dose Lovenox  Consultants: cardiology  Procedures: none  Antibiotics: Anti-infectives    None      Objective: Filed Weights   08/02/14 1850 08/03/14 0510  Weight: 52.118 kg (114 lb 14.4 oz) 52.23 kg (115 lb 2.3 oz)    Intake/Output Summary (Last 24 hours) at 08/03/14 1411 Last data filed at 08/03/14 1316  Gross per 24 hour  Intake    760 ml  Output   2751 ml  Net  -1991 ml     Vitals Filed Vitals:   08/03/14 0804 08/03/14 1133 08/03/14 1155 08/03/14 1335  BP: 176/80 151/49 156/70 165/65  Pulse: 74 86 74 95  Temp: 97.6 F (36.4 C)   97.7 F (36.5 C)  TempSrc: Oral   Oral  Resp: 18 20 16 18   Height:      Weight:      SpO2: 97% 97% 96% 95%    Exam: General: AAO x3, No acute respiratory distress Lungs: Clear to auscultation bilaterally without wheezes or crackles Cardiovascular: Regular rate and rhythm without murmur gallop or rub normal S1 and S2 Abdomen: Nontender, nondistended, soft, bowel sounds positive, no rebound, no ascites, no appreciable mass Extremities: No significant cyanosis, clubbing, +2 pitting edema bilateral lower extremities  Data Reviewed: Basic Metabolic Panel:  Recent Labs Lab 08/02/14 1402 08/03/14 0010  NA 141 140  K 3.9 3.5  CL 103 101  CO2 34* 34*  GLUCOSE 124* 172*  BUN 41* 43*  CREATININE 1.28* 1.50*  CALCIUM 9.1 8.7   Liver Function Tests:  Recent Labs Lab 08/02/14 1402  AST 25  ALT 9  ALKPHOS 38*  BILITOT 0.4  PROT 6.0  ALBUMIN 2.9*   No results for input(s): LIPASE, AMYLASE in the last 168 hours. No results for input(s): AMMONIA in the last 168 hours. CBC:  Recent Labs Lab 08/02/14 1402 08/03/14 0010  WBC 6.6 6.4  NEUTROABS 4.8 4.5  HGB 13.4 11.4*  HCT 42.6 36.5  MCV 87.8 88.0  PLT 194 185   Cardiac Enzymes:  Recent Labs Lab 08/02/14 1402 08/02/14 1934  08/03/14 0010 08/03/14 0508  TROPONINI 0.04* 0.04* 0.04* 0.05*   BNP (last 3 results)  Recent Labs  08/02/14 1407  BNP 2079.5*    ProBNP (last 3 results) No results for input(s): PROBNP in the last 8760 hours.  CBG: No results for input(s): GLUCAP in the last 168 hours.  No results found for this or any previous visit (from the past 240 hour(s)).   Studies:  Recent x-ray studies have been reviewed in detail by the Attending Physician  Scheduled Meds:  Scheduled Meds: . antiseptic oral rinse  7 mL Mouth Rinse BID  . enoxaparin (LOVENOX) injection  30 mg Subcutaneous Q24H  . furosemide  40 mg Intravenous BID  . hydrALAZINE  50 mg Oral 4 times per day  . Influenza vac split quadrivalent PF  0.5 mL Intramuscular Tomorrow-1000  . isosorbide mononitrate  30 mg Oral Daily  . Linaclotide  145 mcg Oral Daily  . metoprolol tartrate  12.5 mg Oral BID  . sodium chloride  3 mL Intravenous Q12H   Continuous Infusions:   Time spent on care of this patient: 35 min   [place], MD 08/03/2014, 2:11 PM  LOS: 1 day   [facility] Office  [phone] Pager - Text Page per [email or web address]  If 7PM-7AM, please contact night-coverage [email or web address]

## 2014-08-03 NOTE — Progress Notes (Signed)
[place] with Dr Wynelle Cleveland . Updated with pt's status . Kept Mepelex on , Wet  compress continued

## 2014-08-03 NOTE — Progress Notes (Addendum)
1545 Pt reported that bruising to Left lower  leg mid area occurred when  a can of soda rolled on  to her leg from the bedside food tray . Pt denied pain, all extremities mobile all pulses palpable , skin warm and dry to touch . Son in attendance updated with situation . Placed a call to Dr . Wynelle Cleveland no further order given. Mepelex applied to area . Cool compress apllied . Continue monitoring done. [place] , CN reevaluated the pt

## 2014-08-04 ENCOUNTER — Encounter (HOSPITAL_COMMUNITY): Payer: Self-pay | Admitting: Cardiology

## 2014-08-04 ENCOUNTER — Inpatient Hospital Stay (HOSPITAL_COMMUNITY): Payer: Medicare Other

## 2014-08-04 DIAGNOSIS — I5021 Acute systolic (congestive) heart failure: Secondary | ICD-10-CM

## 2014-08-04 DIAGNOSIS — E440 Moderate protein-calorie malnutrition: Secondary | ICD-10-CM | POA: Diagnosis present

## 2014-08-04 DIAGNOSIS — I4892 Unspecified atrial flutter: Secondary | ICD-10-CM

## 2014-08-04 LAB — BASIC METABOLIC PANEL
Anion gap: 6 (ref 5–15)
BUN: 41 mg/dL — ABNORMAL HIGH (ref 6–23)
CALCIUM: 8.7 mg/dL (ref 8.4–10.5)
CHLORIDE: 99 mmol/L (ref 96–112)
CO2: 36 mmol/L — AB (ref 19–32)
Creatinine, Ser: 1.57 mg/dL — ABNORMAL HIGH (ref 0.50–1.10)
GFR calc Af Amer: 34 mL/min — ABNORMAL LOW (ref >90)
GFR calc non Af Amer: 29 mL/min — ABNORMAL LOW (ref >90)
GLUCOSE: 121 mg/dL — AB (ref 70–99)
Potassium: 3.4 mmol/L — ABNORMAL LOW (ref 3.5–5.1)
Sodium: 141 mmol/L (ref 135–145)

## 2014-08-04 LAB — PROTIME-INR
INR: 1.1 (ref 0.00–1.49)
PROTHROMBIN TIME: 14.3 s (ref 11.6–15.2)

## 2014-08-04 MED ORDER — METOPROLOL TARTRATE 25 MG PO TABS
25.0000 mg | ORAL_TABLET | Freq: Two times a day (BID) | ORAL | Status: DC
  Filled 2014-08-04: qty 1

## 2014-08-04 MED ORDER — ASPIRIN EC 81 MG PO TBEC
81.0000 mg | DELAYED_RELEASE_TABLET | Freq: Every day | ORAL | Status: DC
  Administered 2014-08-04 – 2014-08-11 (×7): 81 mg via ORAL
  Filled 2014-08-04 (×8): qty 1

## 2014-08-04 MED ORDER — POTASSIUM CHLORIDE CRYS ER 20 MEQ PO TBCR
40.0000 meq | EXTENDED_RELEASE_TABLET | Freq: Every morning | ORAL | Status: AC
  Administered 2014-08-04 – 2014-08-05 (×2): 40 meq via ORAL
  Filled 2014-08-04 (×2): qty 2

## 2014-08-04 MED ORDER — ATORVASTATIN CALCIUM 20 MG PO TABS
20.0000 mg | ORAL_TABLET | Freq: Every day | ORAL | Status: DC
  Administered 2014-08-04 – 2014-08-10 (×7): 20 mg via ORAL
  Filled 2014-08-04 (×8): qty 1

## 2014-08-04 MED ORDER — CARVEDILOL 6.25 MG PO TABS
6.2500 mg | ORAL_TABLET | Freq: Two times a day (BID) | ORAL | Status: DC
  Administered 2014-08-04 – 2014-08-10 (×13): 6.25 mg via ORAL
  Filled 2014-08-04 (×16): qty 1

## 2014-08-04 NOTE — Progress Notes (Signed)
Pt c/o left lower quadrant abdominal pain, pt has colostomy bag, on palpation pt LLQ is hard to touch, pt verbalized that she's constipated, colostomy bag removed and RN tried to disimpact manually, got moderate amount of stool, placed a new colostomy bag. K. Schorr NP notified, milk of magnesia given and zofran 4mg  IV given as pt is also complaining of nausea. Will continue to monitor.

## 2014-08-04 NOTE — Consult Note (Signed)
Primary cardiologist: Dr Ron Parker  HPI: 81 year old female with past medical history of coronary artery disease for evaluation of atrial fibrillation, congestive heart failure and pericardial effusion. Patient had a myocardial infarction in 2012. Cardiac catheterization was attempted but despite multiple attempts access could not be obtained from the right radial or either femoral arteries. She has been treated medically. Echocardiogram showed normal LV function, mild left atrial enlargement, moderate mitral regurgitation and a trivial pericardial effusion. She was admitted with complaints of increasing dyspnea and bilateral lower extremity edema for one week. She was noted to be in congestive heart failure and has been treated. Echocardiogram showed reduced LV function with a pericardial effusion with question tamponade physiology. Also Noted to ne in Atrial Fibrillation. Cardiology Asked to Evaluate. She Has Not Had Chest Pain.  Medications Prior to Admission  Medication Sig Dispense Refill  . Chlorpheniramine-Acetaminophen (CORICIDIN HBP COLD/FLU PO) Take 1 tablet by mouth daily as needed (cold).    . cloNIDine (CATAPRES) 0.2 MG tablet Take 0.2 mg by mouth 2 (two) times daily.    . digoxin (LANOXIN) 0.125 MG tablet Take 125 mcg by mouth daily.      . fenofibrate 160 MG tablet Take 160 mg by mouth daily.      Marland Kitchen LINZESS 145 MCG CAPS capsule Take 145 mcg by mouth daily.  4  . metoprolol (LOPRESSOR) 50 MG tablet Take 1 tablet (50 mg total) by mouth 2 (two) times daily. (Patient taking differently: Take 25 mg by mouth 2 (two) times daily. ) 60 tablet 1  . Olmesartan-Amlodipine-HCTZ 40-5-25 MG TABS Take 1 tablet by mouth daily.    Marland Kitchen amLODipine (NORVASC) 10 MG tablet Take 1 tablet (10 mg total) by mouth daily. (Patient not taking: Reported on 08/02/2014) 30 tablet 0  . enalapril (VASOTEC) 20 MG tablet Take 2 tablets (40 mg total) by mouth daily. (Patient not taking: Reported on 08/02/2014) 60 tablet 1  .  nitroGLYCERIN (NITROSTAT) 0.4 MG SL tablet Place 0.4 mg under the tongue every 5 (five) minutes as needed. For chest pain    . potassium chloride SA (K-DUR,KLOR-CON) 20 MEQ tablet Take 1 tablet (20 mEq total) by mouth 2 (two) times daily. (Patient not taking: Reported on 08/02/2014) 14 tablet 0  . triamterene-hydrochlorothiazide (DYAZIDE) 37.5-25 MG per capsule Take 1 each (1 capsule total) by mouth every morning. (Patient not taking: Reported on 08/02/2014) 30 capsule 0    Allergies  Allergen Reactions  . Promethazine Other (See Comments)    Unknown      Past Medical History  Diagnosis Date  . Hypertension   . UTI (urinary tract infection)   . SBO (small bowel obstruction)     Recurrent, prolonged hospitalization November, 2012  . Protein calorie malnutrition   . Hyperlipidemia   . DM (diabetes mellitus)   . COPD (chronic obstructive pulmonary disease)   . PVD (peripheral vascular disease)   . Colon cancer   . Bradycardia   . Tobacco abuse   . CAD (coronary artery disease)     Non-STEMI November, 2012,, Cardiac catheterization was attempted, despite significant efforts catheters could not be progressed to the coronary arteries ., Therefore medical therapy  . Carotid artery disease     Doppler  April 16, 2011,, total occlusion LICA, 56-25% R. ICA  . PAD (peripheral artery disease)     Suggestions significant stenosis distal aorta by CT angiogram  November, 2012  . Ejection fraction     EF 60%, echo, November, 2012,  .  Mitral regurgitation     Moderate, echo, November, 2012  . Right ventricular dysfunction     Moderate, echo, November, 2012  . Edema     December, 2012  . Rash     Rash  probably related to hydralazine  December, 2012    Past Surgical History  Procedure Laterality Date  . Colostomy    . Abdominoperineal proctocolectomy    . Left heart catheterization with coronary angiogram N/A 04/24/2011    Procedure: LEFT HEART CATHETERIZATION WITH CORONARY ANGIOGRAM;   Surgeon: Wendall Stade, MD;  Location: [facility] CATH LAB;  Service: Cardiovascular;  Laterality: N/A;    History   Social History  . Marital Status: Widowed    Spouse Name: N/A  . Number of Children: 5  . Years of Education: N/A   Occupational History  . Not on file.   Social History Main Topics  . Smoking status: Current Every Day Smoker -- 0.50 packs/day    Types: Cigarettes  . Smokeless tobacco: Not on file  . Alcohol Use: No  . Drug Use: No  . Sexual Activity: Not Currently   Other Topics Concern  . Not on file   Social History Narrative    Family History  Problem Relation Age of Onset  . Coronary artery disease      ROS:  Dyspnea on exertion and pedal edema but no fevers or chills, productive cough, hemoptysis, dysphasia, odynophagia, melena, hematochezia, dysuria, hematuria, rash, seizure activity. Remaining systems are negative.  Physical Exam:   Blood pressure 126/57, pulse 99, temperature 97.4 F (36.3 C), temperature source Oral, resp. rate 18, height 5\' 1"  (1.549 m), weight 112 lb 14 oz (51.2 kg), SpO2 93 %.  General:  Well developed/frail in NAD Skin warm/dry, patient noted to have dressing of an ulcer on her sacral area and also left lower extremity Patient not depressed No peripheral clubbing Back-normal HEENT-normal/normal eyelids Neck supple/normal carotid upstroke bilaterally; no bruits; no JVD; no thyromegaly chest - diminished breath sounds bases CV - irregular/normal S1 and S2; no murmurs, rubs or gallops;  PMI nondisplaced Abdomen -NT/ND, no HSM, no mass, + bowel sounds, no bruit, colostomy Femoral pulses not palpable Ext-1+ edema, no chords, diminished distal pulses Neuro-grossly nonfocal  ECG atrial fibrillation, left ventricular hypertrophy with repolarization abnormality.  Results for orders placed or performed during the hospital encounter of 08/02/14 (from the past 48 hour(s))  Brain natriuretic peptide     Status: Abnormal   Collection  Time: 08/02/14  2:07 PM  Result Value Ref Range   B Natriuretic Peptide 2079.5 (H) 0.0 - 100.0 pg/mL  Digoxin level     Status: None   Collection Time: 08/02/14  2:18 PM  Result Value Ref Range   Digoxin Level 0.8 0.8 - 2.0 ng/mL  Troponin I (q 6hr x 3)     Status: Abnormal   Collection Time: 08/02/14  7:34 PM  Result Value Ref Range   Troponin I 0.04 (H) <0.031 ng/mL    Comment:        PERSISTENTLY INCREASED TROPONIN VALUES IN THE RANGE OF 0.04-0.49 ng/mL CAN BE SEEN IN:       -UNSTABLE ANGINA       -CONGESTIVE HEART FAILURE       -MYOCARDITIS       -CHEST TRAUMA       -ARRYHTHMIAS       -LATE PRESENTING MYOCARDIAL INFARCTION       -COPD   CLINICAL FOLLOW-UP RECOMMENDED.  Basic metabolic panel     Status: Abnormal   Collection Time: 08/03/14 12:10 AM  Result Value Ref Range   Sodium 140 135 - 145 mmol/L   Potassium 3.5 3.5 - 5.1 mmol/L   Chloride 101 96 - 112 mmol/L   CO2 34 (H) 19 - 32 mmol/L   Glucose, Bld 172 (H) 70 - 99 mg/dL   BUN 43 (H) 6 - 23 mg/dL   Creatinine, Ser 1.50 (H) 0.50 - 1.10 mg/dL   Calcium 8.7 8.4 - 10.5 mg/dL   GFR calc non Af Amer 31 (L) >90 mL/min   GFR calc Af Amer 36 (L) >90 mL/min    Comment: (NOTE) The eGFR has been calculated using the CKD EPI equation. This calculation has not been validated in all clinical situations. eGFR's persistently <90 mL/min signify possible Chronic Kidney Disease.    Anion gap 5 5 - 15  CBC WITH DIFFERENTIAL     Status: Abnormal   Collection Time: 08/03/14 12:10 AM  Result Value Ref Range   WBC 6.4 4.0 - 10.5 K/uL   RBC 4.15 3.87 - 5.11 MIL/uL   Hemoglobin 11.4 (L) 12.0 - 15.0 g/dL   HCT 36.5 36.0 - 46.0 %   MCV 88.0 78.0 - 100.0 fL   MCH 27.5 26.0 - 34.0 pg   MCHC 31.2 30.0 - 36.0 g/dL   RDW 14.8 11.5 - 15.5 %   Platelets 185 150 - 400 K/uL   Neutrophils Relative % 71 43 - 77 %   Neutro Abs 4.5 1.7 - 7.7 K/uL   Lymphocytes Relative 20 12 - 46 %   Lymphs Abs 1.3 0.7 - 4.0 K/uL   Monocytes Relative 8 3  - 12 %   Monocytes Absolute 0.5 0.1 - 1.0 K/uL   Eosinophils Relative 1 0 - 5 %   Eosinophils Absolute 0.0 0.0 - 0.7 K/uL   Basophils Relative 0 0 - 1 %   Basophils Absolute 0.0 0.0 - 0.1 K/uL  Troponin I (q 6hr x 3)     Status: Abnormal   Collection Time: 08/03/14 12:10 AM  Result Value Ref Range   Troponin I 0.04 (H) <0.031 ng/mL    Comment:        PERSISTENTLY INCREASED TROPONIN VALUES IN THE RANGE OF 0.04-0.49 ng/mL CAN BE SEEN IN:       -UNSTABLE ANGINA       -CONGESTIVE HEART FAILURE       -MYOCARDITIS       -CHEST TRAUMA       -ARRYHTHMIAS       -LATE PRESENTING MYOCARDIAL INFARCTION       -COPD   CLINICAL FOLLOW-UP RECOMMENDED.   Troponin I (q 6hr x 3)     Status: Abnormal   Collection Time: 08/03/14  5:08 AM  Result Value Ref Range   Troponin I 0.05 (H) <0.031 ng/mL    Comment:        PERSISTENTLY INCREASED TROPONIN VALUES IN THE RANGE OF 0.04-0.49 ng/mL CAN BE SEEN IN:       -UNSTABLE ANGINA       -CONGESTIVE HEART FAILURE       -MYOCARDITIS       -CHEST TRAUMA       -ARRYHTHMIAS       -LATE PRESENTING MYOCARDIAL INFARCTION       -COPD   CLINICAL FOLLOW-UP RECOMMENDED.   Digoxin level     Status: Abnormal   Collection Time: 08/03/14  9:19 AM  Result Value Ref Range   Digoxin Level 0.6 (L) 0.8 - 2.0 ng/mL  Basic metabolic panel     Status: Abnormal   Collection Time: 08/04/14  4:25 AM  Result Value Ref Range   Sodium 141 135 - 145 mmol/L   Potassium 3.4 (L) 3.5 - 5.1 mmol/L   Chloride 99 96 - 112 mmol/L   CO2 36 (H) 19 - 32 mmol/L   Glucose, Bld 121 (H) 70 - 99 mg/dL   BUN 41 (H) 6 - 23 mg/dL   Creatinine, Ser 1.57 (H) 0.50 - 1.10 mg/dL   Calcium 8.7 8.4 - 10.5 mg/dL   GFR calc non Af Amer 29 (L) >90 mL/min   GFR calc Af Amer 34 (L) >90 mL/min    Comment: (NOTE) The eGFR has been calculated using the CKD EPI equation. This calculation has not been validated in all clinical situations. eGFR's persistently <90 mL/min signify possible Chronic  Kidney Disease.    Anion gap 6 5 - 15  Protime-INR     Status: None   Collection Time: 08/04/14  4:25 AM  Result Value Ref Range   Prothrombin Time 14.3 11.6 - 15.2 seconds   INR 1.10 0.00 - 1.49    Dg Chest 2 View  08/04/2014   CLINICAL DATA:  81 year old female with a history of pulmonary edema and weakness.  EXAM: CHEST - 2 VIEW  COMPARISON:  08/02/2014, CT abdomen 04/16/2012, plain film 04/17/2011 and 04/14/2011  FINDINGS: Cardiomediastinal silhouette unchanged in size and contour. Cardiomegaly persists.  Atherosclerotic calcifications of the aortic arch.  Dense opacities at the bilateral lung bases with obscuration of the hemidiaphragms.  Interlobular septal thickening.  Diffuse osteopenia. Degenerative changes of the bilateral shoulders.  IMPRESSION: Persisting pulmonary edema and bibasilar pleural effusions with associated atelectasis.  Cardiomegaly.  Atherosclerosis  Signed,  Dulcy Fanny. Earleen Newport, DO  [facility]  [facility]   Electronically Signed   By: Corrie Mckusick D.O.   On: 08/04/2014 09:12   Dg Chest 2 View  08/02/2014   CLINICAL DATA:  High blood pressure and shortness of breath for 2 days.  EXAM: CHEST  2 VIEW  COMPARISON:  04/17/2011  FINDINGS: The heart is enlarged. There is tortuosity, ectasia and calcification of the thoracic aorta. There are bilateral pleural effusions, right greater than left and interstitial pulmonary edema. The bony thorax is intact  IMPRESSION: CHF with bilateral effusions, right greater than left.   Electronically Signed   By: Marijo Sanes M.D.   On: 08/02/2014 15:14    Assessment/Plan 1 acute systolic congestive heart failure-the patient presented with congestive heart failure. She is improving by her report. Echocardiogram shows reduced LV function. Would continue Lasix at present dose and follow renal function. I agree with hydralazine/nitrates for reduced LV function. Avoid ACE inhibitor given renal  insufficiency. Change metoprolol to carvedilol 6.25 mg by mouth twice a day. Not a candidate for aggressive cardiac evaluation as she could not have access obtained at time of previous attempt at cardiac catheterization. She is also very frail. 2 atrial fibrillation-duration unknown. Would continue beta blocker for rate control. CHADSvasc 7. However she is very frail and also has a moderate pericardial effusion. I would avoid anticoagulation long-term at this point. We can reconsider if a pericardial effusion improves. Continue aspirin. Plan rate control.  Would avoid digoxin given renal insufficiency. 3 pericardial effusion-interpreted as moderate with possible tamponade physiology. However she is not tachycardic at present nor is she hypotensive.  Would repeat echocardiogram in approximately 1 week to reassess. 4 coronary artery disease-add aspirin and statin. 5 hypertension-continue present medications. 6 minimal elevation in troponin-not clearly diagnostic. No further ischemia evaluation as described above. 7 chronic stage III renal failure-follow renal function closely with diuresis.  Kirk Ruths MD 08/04/2014, 2:03 PM

## 2014-08-04 NOTE — Progress Notes (Signed)
SATURATION QUALIFICATIONS: (This note is used to comply with regulatory documentation for home oxygen)-PHYSICAL THERAPY NOTE 08/04/14  Patient Saturations on Room Air at Rest = 88%  Patient Saturations on Room Air while Ambulating = 88%  Patient Saturations on 2 Liters of oxygen while RESTING IN RECLINER, TOO TIRED TO WALK AGAIN, 95%  Please briefly explain why patient needs home oxygen:SATS DID NOT DROP BELOW 88%  MAY NEED TO AMBULATE AGAIN IN HALLWAY AT ANOTHER TIME AS PATIENT WAS IN PAIN AND VERY FATIGUED. Tresa Endo PT [phone]

## 2014-08-04 NOTE — Evaluation (Signed)
Physical Therapy Evaluation Patient Details Name: Monica Rhodes MRN: [id number] DOB: [birth year] Today's Date: 08/04/2014   History of Present Illness  81 YO FEMALE ADMITTED 08/02/14 with  increased SOB, bilateral leg swelling, acute systolic CHF, EF 70%, h/o colostomy, afib.  Clinical Impression  Patient in recliner on RA at PT entrance with sats at 88%. Ambulated in room x 24' on RA  with stas at 88%. Placed Oxygen on while in recliner with sats 95%. Removed oxygen  With sats  94% at rest. Unable to ambulate  On oxygen due to patient c/o abdominal pain and  Fatigue. Patient will benefit from PT while in acute care to address problems  Listed in note below.    Follow Up Recommendations Home health PT;Supervision/Assistance - 24 hour    Equipment Recommendations  None recommended by PT    Recommendations for Other Services       Precautions / Restrictions Precautions Precaution Comments: colostomy, monitor sats      Mobility  Bed Mobility               General bed mobility comments: in recliner  Transfers Overall transfer level: Needs assistance Equipment used: Rolling walker (2 wheeled) Transfers: Sit to/from Stand Sit to Stand: Min assist         General transfer comment: cues for safety and hand placement.  Ambulation/Gait Ambulation/Gait assistance: Min assist Ambulation Distance (Feet): 24 Feet Assistive device: Rolling walker (2 wheeled) Gait Pattern/deviations: Step-through pattern;Decreased stride length Gait velocity: decreased.   General Gait Details: cues for safety and turning with RW.  Stairs            Wheelchair Mobility    Modified Rankin (Stroke Patients Only)       Balance Overall balance assessment: Needs assistance Sitting-balance support: No upper extremity supported;Feet supported Sitting balance-Leahy Scale: Good     Standing balance support: During functional activity;No upper extremity supported Standing balance-Leahy  Scale: Fair                               Pertinent Vitals/Pain Pain Assessment: 0-10 Pain Score: 5  Pain Location: abdomen Pain Descriptors / Indicators: Cramping Pain Intervention(s): Limited activity within patient's tolerance;Patient requesting pain meds-RN notified;Repositioned    Home Living Family/patient expects to be discharged to:: Private residence Living Arrangements: Children Available Help at Discharge: Family Type of Home: House Home Access: Stairs to enter   Technical brewer of Steps: 2 Home Layout: One level Home Equipment: [place] - 2 wheels Additional Comments: son stays with patient as needed.    Prior Function Level of Independence: Independent               Hand Dominance        Extremity/Trunk Assessment   Upper Extremity Assessment: Generalized weakness           Lower Extremity Assessment: Generalized weakness      Cervical / Trunk Assessment: Kyphotic  Communication   Communication: No difficulties  Cognition Arousal/Alertness: Awake/alert Behavior During Therapy: WFL for tasks assessed/performed Overall Cognitive Status: Within Functional Limits for tasks assessed                      General Comments      Exercises        Assessment/Plan    PT Assessment Patient needs continued PT services  PT Diagnosis Difficulty walking;Generalized weakness;Acute pain   PT Problem List Decreased  strength;Decreased activity tolerance;Decreased mobility;Decreased knowledge of use of DME;Pain  PT Treatment Interventions DME instruction;Functional mobility training;Therapeutic activities;Therapeutic exercise;Patient/family education   PT Goals (Current goals can be found in the Care Plan section) Acute Rehab PT Goals Patient Stated Goal: to get over this pain, go home PT Goal Formulation: With patient Time For Goal Achievement: 08/18/14 Potential to Achieve Goals: Good    Frequency Min 3X/week    Barriers to discharge        Co-evaluation               End of Session Equipment Utilized During Treatment: Gait belt Activity Tolerance: Patient limited by fatigue;Patient limited by pain Patient left: in chair;with call bell/phone within reach;with family/visitor present Nurse Communication: Mobility status         Time: 1420-1455 PT Time Calculation (min) (ACUTE ONLY): 35 min   Charges:   PT Evaluation $Initial PT Evaluation Tier I: 1 Procedure PT Treatments $Gait Training: 8-22 mins   PT G Codes:        Claretha Cooper 08/04/2014, 3:32 PM Tresa Endo PT [phone]

## 2014-08-04 NOTE — Progress Notes (Signed)
[facility] Progress Note   Monica Rhodes SAY:[id number] DOB: 1928/05/03 DOA: 08/02/2014 PCP: Elyn Peers, MD  Brief narrative: Monica Rhodes is a 81 y.o. female with HTN and A-fib presenting with dyspnea, orthopnea and severe pedal edema. I had a long discussion with her and her husband about her medications - they initially believed that she had been taking Lasix at home and this was discontinued about 1 mo ago and she was switched to [place], but after reviewing all of her medications (old and new) it appears she was previously on Triamterene/ HCTZ alone with Amlodipine (separately) which was switched to Tribenzor about 1 mo ago. The husband feels that her BP is much better controlled on Tribenzor that it had been in the past.   Subjective: No complaints- she thinks she feels better today than she did yesterday  Assessment/Plan: Principal Problem:   Acute systolic congestive heart failure - Echo report reveals that she has systolic heart failure with an EF of 35% - cont to diurese with Lasix and follow I and O carefully - wean O2 as able-  Active Problems:  Possible tamponade/moderate pericardial effusion -Reviewed echo report today which has findings that may be consistent with tamponade on. The patient does not not clinically fit the picture of cardiac tamponade -I have asked for a cardiology consult to further investigate the pericardial infusion  Mildly elevated Troponin - likely in related to CHF- no chest pain    Atrial flutter -presuming this is a new diagnosis- noted incidentally on EKG on admission - no h/o palpitations- no record of this in chart and no sign of it on old EKGs that are available in Epic - cont Metoprolol for rate control but at lower dose due to bradycardic episode -Have started Lovenox for anticoagulation-will allow cardiology to decide on what to give her for long-term anticoagulation- due to poor creatinine clearance, she is not a candidate  for the new anticoagulants will likely need to use Coumadin  Bradycardia HR dropped into 30s and 2.19 sec pause noted on tele- held Clonidine and Digoxin and decreased the dose of Metoprolol- heart rate is currently stable-will attempt to increase metoprolol while continuing to hold clonidine and digoxin today  Acute renal failure on CKD 3 - may be due to Lasix- cont to follow closely- holding ARB- on hydralazine and Imdur for afterload reduction      Hypertension - apparently was difficult to control at home but appears to be reasonably controlled here - due to bradycardia, have had to hold Clonidine and due to renal failure, holding ARB - added Hydralazine and Imdur in place of above mentioned held medications    Code Status: full code Family Communication: husband Disposition Plan: home when stable-  DVT prophylaxis: start full dose Lovenox  Consultants: cardiology  Procedures: none  Antibiotics: Anti-infectives    None      Objective: Filed Weights   08/02/14 1850 08/03/14 0510 08/04/14 0451  Weight: 52.118 kg (114 lb 14.4 oz) 52.23 kg (115 lb 2.3 oz) 51.2 kg (112 lb 14 oz)    Intake/Output Summary (Last 24 hours) at 08/04/14 1209 Last data filed at 08/04/14 0946  Gross per 24 hour  Intake    900 ml  Output   1825 ml  Net   -925 ml     Vitals Filed Vitals:   08/03/14 1335 08/03/14 1848 08/03/14 2132 08/04/14 0451  BP: 165/65 117/40 147/81 126/57  Pulse: 95 83 80 99  Temp: 97.7 F (36.5 C)  97.5 F (36.4 C) 98.2 F (36.8 C) 97.4 F (36.3 C)  TempSrc: Oral Oral Oral Oral  Resp: 18 18 18 18   Height:      Weight:    51.2 kg (112 lb 14 oz)  SpO2: 95% 98% 95% 97%    Exam: General: AAO x3, No acute respiratory distress Lungs: Clear to auscultation bilaterally without wheezes or crackles Cardiovascular: Regular rate and rhythm without murmur gallop or rub normal S1 and S2 Abdomen: Nontender, nondistended, soft, bowel sounds positive, no rebound, no  ascites, no appreciable mass Extremities: No significant cyanosis, clubbing, +2 pitting edema bilateral lower extremities  Data Reviewed: Basic Metabolic Panel:  Recent Labs Lab 08/02/14 1402 08/03/14 0010 08/04/14 0425  NA 141 140 141  K 3.9 3.5 3.4*  CL 103 101 99  CO2 34* 34* 36*  GLUCOSE 124* 172* 121*  BUN 41* 43* 41*  CREATININE 1.28* 1.50* 1.57*  CALCIUM 9.1 8.7 8.7   Liver Function Tests:  Recent Labs Lab 08/02/14 1402  AST 25  ALT 9  ALKPHOS 38*  BILITOT 0.4  PROT 6.0  ALBUMIN 2.9*   No results for input(s): LIPASE, AMYLASE in the last 168 hours. No results for input(s): AMMONIA in the last 168 hours. CBC:  Recent Labs Lab 08/02/14 1402 08/03/14 0010  WBC 6.6 6.4  NEUTROABS 4.8 4.5  HGB 13.4 11.4*  HCT 42.6 36.5  MCV 87.8 88.0  PLT 194 185   Cardiac Enzymes:  Recent Labs Lab 08/02/14 1402 08/02/14 1934 08/03/14 0010 08/03/14 0508  TROPONINI 0.04* 0.04* 0.04* 0.05*   BNP (last 3 results)  Recent Labs  08/02/14 1407  BNP 2079.5*    ProBNP (last 3 results) No results for input(s): PROBNP in the last 8760 hours.  CBG: No results for input(s): GLUCAP in the last 168 hours.  No results found for this or any previous visit (from the past 240 hour(s)).   Studies:  Recent x-ray studies have been reviewed in detail by the Attending Physician  Scheduled Meds:  Scheduled Meds: . amLODipine  10 mg Oral Daily  . antiseptic oral rinse  7 mL Mouth Rinse BID  . enoxaparin (LOVENOX) injection  50 mg Subcutaneous Q24H  . feeding supplement (GLUCERNA SHAKE)  237 mL Oral TID BM  . furosemide  40 mg Intravenous BID  . hydrALAZINE  50 mg Oral 4 times per day  . hydrochlorothiazide  25 mg Oral Daily  . isosorbide mononitrate  30 mg Oral Daily  . Linaclotide  145 mcg Oral Daily  . metoprolol tartrate  12.5 mg Oral BID  . multivitamin with minerals  1 tablet Oral Daily  . potassium chloride  40 mEq Oral q morning - 10a  . sodium chloride  3  mL Intravenous Q12H   Continuous Infusions:   Time spent on care of this patient: 35 min   [place], MD 08/04/2014, 12:09 PM  LOS: 2 days   [facility] Office  [phone] Pager - Text Page per [email or web address]  If 7PM-7AM, please contact night-coverage [email or web address]

## 2014-08-04 NOTE — Care Management Note (Signed)
    Page 1 of 1   08/09/2014     10:10:30 AM CARE MANAGEMENT NOTE 08/09/2014  Patient:  Monica Rhodes, Monica Rhodes   Account Number:  [id number]  Date Initiated:  08/03/2014  Documentation initiated by:  AMERSON,JULIE  Subjective/Objective Assessment:   Pt adm on 08/02/14 with CHF, bradycardia.  PTA, pt lives at home with son.     Action/Plan:   Will follow for dc needs as pt progresses.  PT eval pending.   Anticipated DC Date:  08/09/2014   Anticipated DC Plan:  [place]  CM consult      Choice offered to / List presented to:             Status of service:  In process, will continue to follow Medicare Important Message given?  YES (If response is "NO", the following Medicare IM given date fields will be blank) Date Medicare IM given:  08/05/2014 Medicare IM given by:  AMERSON,JULIE Date Additional Medicare IM given:  08/09/2014 Additional Medicare IM given by:  [facility] As[facility] Farinas  Discharge Disposition:  [place]  Per UR Regulation:  Reviewed for med. necessity/level of care/duration of stay  If discussed at [place] of Stay Meetings, dates discussed:    Comments:  08/09/14 10:00 Cm met with pt and two sons in room to offer choice of home health agency. Pt's son, Elenore Rota [phone] lives with pt and will be primary contact. [name] is daughter, [phone] and is secondary contact.  Address is correct on facesheet.  [facility] will render HHPT/RN services and referral called to [facility] rep, [name] with contact information.  No other CM needs were communicated.  Mariane Masters, BSN, [place].  08/07/14 Ellan Lambert, RN, BSN [phone] Met with pt's son and dtr in law to discuss [facility] arrangements.  They prefer that Case Mgr speak with pt's son that lives with her.  Will ask weekend case mgr to follow up.  [place] HH list left with son at bedside for review.

## 2014-08-05 DIAGNOSIS — I313 Pericardial effusion (noninflammatory): Secondary | ICD-10-CM | POA: Diagnosis present

## 2014-08-05 DIAGNOSIS — I3139 Other pericardial effusion (noninflammatory): Secondary | ICD-10-CM | POA: Diagnosis present

## 2014-08-05 DIAGNOSIS — I319 Disease of pericardium, unspecified: Secondary | ICD-10-CM

## 2014-08-05 LAB — BASIC METABOLIC PANEL
Anion gap: 8 (ref 5–15)
BUN: 44 mg/dL — ABNORMAL HIGH (ref 6–23)
CHLORIDE: 95 mmol/L — AB (ref 96–112)
CO2: 35 mmol/L — ABNORMAL HIGH (ref 19–32)
Calcium: 8.7 mg/dL (ref 8.4–10.5)
Creatinine, Ser: 1.77 mg/dL — ABNORMAL HIGH (ref 0.50–1.10)
GFR calc Af Amer: 29 mL/min — ABNORMAL LOW (ref >90)
GFR calc non Af Amer: 25 mL/min — ABNORMAL LOW (ref >90)
Glucose, Bld: 108 mg/dL — ABNORMAL HIGH (ref 70–99)
POTASSIUM: 3.7 mmol/L (ref 3.5–5.1)
Sodium: 138 mmol/L (ref 135–145)

## 2014-08-05 MED ORDER — FUROSEMIDE 10 MG/ML IJ SOLN
40.0000 mg | Freq: Every day | INTRAMUSCULAR | Status: DC
  Filled 2014-08-05: qty 4

## 2014-08-05 NOTE — Progress Notes (Signed)
SUBJECTIVE: Patient is feeling somewhat better today. She is frail. There has been significant diuresis.   Filed Vitals:   08/04/14 1615 08/04/14 2045 08/05/14 0500 08/05/14 0900  BP: 109/56 147/48 158/68 135/51  Pulse: 78 82 71 82  Temp: 98.4 F (36.9 C) 98.7 F (37.1 C) 98.6 F (37 C) 97.1 F (36.2 C)  TempSrc: Oral Oral Oral Oral  Resp: 18 18 18 18   Height:      Weight:   108 lb 14.5 oz (49.4 kg)   SpO2: 92% 94% 95% 88%     Intake/Output Summary (Last 24 hours) at 08/05/14 1002 Last data filed at 08/05/14 0900  Gross per 24 hour  Intake    120 ml  Output   4400 ml  Net  -4280 ml    LABS: Basic Metabolic Panel:  Recent Labs  08/04/14 0425 08/05/14 0433  NA 141 138  K 3.4* 3.7  CL 99 95*  CO2 36* 35*  GLUCOSE 121* 108*  BUN 41* 44*  CREATININE 1.57* 1.77*  CALCIUM 8.7 8.7   Liver Function Tests:  Recent Labs  08/02/14 1402  AST 25  ALT 9  ALKPHOS 38*  BILITOT 0.4  PROT 6.0  ALBUMIN 2.9*   No results for input(s): LIPASE, AMYLASE in the last 72 hours. CBC:  Recent Labs  08/02/14 1402 08/03/14 0010  WBC 6.6 6.4  NEUTROABS 4.8 4.5  HGB 13.4 11.4*  HCT 42.6 36.5  MCV 87.8 88.0  PLT 194 185   Cardiac Enzymes:  Recent Labs  08/02/14 1934 08/03/14 0010 08/03/14 0508  TROPONINI 0.04* 0.04* 0.05*   BNP: Invalid input(s): POCBNP D-Dimer: No results for input(s): DDIMER in the last 72 hours. Hemoglobin A1C: No results for input(s): HGBA1C in the last 72 hours. Fasting Lipid Panel: No results for input(s): CHOL, HDL, LDLCALC, TRIG, CHOLHDL, LDLDIRECT in the last 72 hours. Thyroid Function Tests: No results for input(s): TSH, T4TOTAL, T3FREE, THYROIDAB in the last 72 hours.  Invalid input(s): FREET3  RADIOLOGY: Dg Chest 2 View  08/04/2014   CLINICAL DATA:  81 year old female with a history of pulmonary edema and weakness.  EXAM: CHEST - 2 VIEW  COMPARISON:  08/02/2014, CT abdomen 04/16/2012, plain film 04/17/2011 and 04/14/2011   FINDINGS: Cardiomediastinal silhouette unchanged in size and contour. Cardiomegaly persists.  Atherosclerotic calcifications of the aortic arch.  Dense opacities at the bilateral lung bases with obscuration of the hemidiaphragms.  Interlobular septal thickening.  Diffuse osteopenia. Degenerative changes of the bilateral shoulders.  IMPRESSION: Persisting pulmonary edema and bibasilar pleural effusions with associated atelectasis.  Cardiomegaly.  Atherosclerosis  Signed,  Dulcy Fanny. Earleen Newport, DO  [facility]  [facility]   Electronically Signed   By: Corrie Mckusick D.O.   On: 08/04/2014 09:12   Dg Chest 2 View  08/02/2014   CLINICAL DATA:  High blood pressure and shortness of breath for 2 days.  EXAM: CHEST  2 VIEW  COMPARISON:  04/17/2011  FINDINGS: The heart is enlarged. There is tortuosity, ectasia and calcification of the thoracic aorta. There are bilateral pleural effusions, right greater than left and interstitial pulmonary edema. The bony thorax is intact  IMPRESSION: CHF with bilateral effusions, right greater than left.   Electronically Signed   By: Marijo Sanes M.D.   On: 08/02/2014 15:14    PHYSICAL EXAM  patient is oriented to person time and place. She is frail. Affect is normal. Lungs reveal scattered rales. There is no respiratory  distress. Cardiac exam reveals an S1 and S2. Abdomen is soft. Significant improvement in the edema in her legs.  TELEMETRY: I have reviewed telemetry today August 05, 2014. There is atrial flutter with a controlled rate.   ASSESSMENT AND PLAN:    Acute systolic CHF (congestive heart failure)    Patient continues to improve with diuresis. I've chosen to decrease her diuretic dose to once daily to be sure that we do not overshoot with her diuresis. I have ordered chemistry lab for tomorrow.    History of colectomy   Hypertension   Malnutrition of moderate degree    Atrial flutter    Rate is controlled at this  time. It has been outlined and we will plan to not anticoagulate at this time because of pericardial effusion.    Pericardial effusion    The plan will be to follow this with a repeat echo over time after she is improved from her CHF.  Dola Argyle 08/05/2014 10:02 AM

## 2014-08-05 NOTE — Progress Notes (Addendum)
[facility] Progress Note   Monica Rhodes SWN:[id number] DOB: March 27, 1928 DOA: 08/02/2014 PCP: Elyn Peers, MD  Brief narrative: Monica Rhodes is a 81 y.o. female with HTN and A-fib presenting with dyspnea, orthopnea and severe pedal edema. I had a long discussion with her and her husband about her medications - they initially believed that she had been taking Lasix at home and this was discontinued about 1 mo ago and she was switched to [place], but after reviewing all of her medications (old and new) it appears she was previously on Triamterene/ HCTZ alone with Amlodipine (separately) which was switched to Tribenzor about 1 mo ago. The husband feels that her BP is much better controlled on Tribenzor that it had been in the past.   Subjective: No complaints- per RN, patient is choking on her pills even after he crushes them but was able to fully eat breakfast this AM  Assessment/Plan: Principal Problem:   Acute systolic congestive heart failure - Echo report reveals that she has systolic heart failure with an EF of 35% - cardiology has decreased Lasix - 6800 cc negative balance- pulse ox 88% on room air  - start IS- patient is not moving around much  Active Problems:  Possible tamponade/moderate pericardial effusion -Reviewed echo report today which has findings that may be consistent with tamponade on. The patient does not not clinically fit the picture of cardiac tamponade -cardiology recommending to repeat ECHO in 1wk  Mildly elevated Troponin - likely in related to CHF- no chest pain    Atrial flutter -presuming this is a new diagnosis- noted incidentally on EKG on admission - no h/o palpitations- no record of this in chart and no sign of it on old EKGs that are available in Epic - cont Metoprolol for rate control but at lower dose due to bradycardic episode -Have started Lovenox for anticoagulation- cardiology recommends to avoid anticoagulation at this  time  Aspiration? Dysphagia? - SLP eval ordered  Bradycardia HR dropped into 30s and 2.19 sec pause noted on tele- held Clonidine and Digoxin and decreased the dose of Metoprolo l- heart rate is currently stable  - continuing to hold clonidine and digoxin- now on Coreg per cadiology  Acute renal failure on CKD 3 - may be due to Lasix- cont to follow closely- holding ARB- on hydralazine and Imdur for afterload reduction      Hypertension - apparently was difficult to control at home but appears to be reasonably controlled here - due to bradycardia, have had to hold Clonidine and due to renal failure, holding ARB - added Hydralazine and Imdur in place of above mentioned held medications    Code Status: full code Family Communication: husband Disposition Plan: home when stable-  DVT prophylaxis: start full dose Lovenox  Consultants: cardiology  Procedures: none  Antibiotics: Anti-infectives    None      Objective: Filed Weights   08/03/14 0510 08/04/14 0451 08/05/14 0500  Weight: 52.23 kg (115 lb 2.3 oz) 51.2 kg (112 lb 14 oz) 49.4 kg (108 lb 14.5 oz)    Intake/Output Summary (Last 24 hours) at 08/05/14 1052 Last data filed at 08/05/14 1004  Gross per 24 hour  Intake    120 ml  Output   4400 ml  Net  -4280 ml     Vitals Filed Vitals:   08/04/14 1615 08/04/14 2045 08/05/14 0500 08/05/14 0900  BP: 109/56 147/48 158/68 135/51  Pulse: 78 82 71 82  Temp: 98.4 F (36.9 C) 98.7  F (37.1 C) 98.6 F (37 C) 97.1 F (36.2 C)  TempSrc: Oral Oral Oral Oral  Resp: 18 18 18 18   Height:      Weight:   49.4 kg (108 lb 14.5 oz)   SpO2: 92% 94% 95% 88%    Exam: General: AAO x3, No acute respiratory distress Lungs: Clear to auscultation bilaterally without wheezes or crackles Cardiovascular: Regular rate and rhythm without murmur gallop or rub normal S1 and S2 Abdomen: Nontender, nondistended, soft, bowel sounds positive, no rebound, no ascites, no appreciable  mass Extremities: No significant cyanosis, clubbing, +2 pitting edema bilateral lower extremities  Data Reviewed: Basic Metabolic Panel:  Recent Labs Lab 08/02/14 1402 08/03/14 0010 08/04/14 0425 08/05/14 0433  NA 141 140 141 138  K 3.9 3.5 3.4* 3.7  CL 103 101 99 95*  CO2 34* 34* 36* 35*  GLUCOSE 124* 172* 121* 108*  BUN 41* 43* 41* 44*  CREATININE 1.28* 1.50* 1.57* 1.77*  CALCIUM 9.1 8.7 8.7 8.7   Liver Function Tests:  Recent Labs Lab 08/02/14 1402  AST 25  ALT 9  ALKPHOS 38*  BILITOT 0.4  PROT 6.0  ALBUMIN 2.9*   No results for input(s): LIPASE, AMYLASE in the last 168 hours. No results for input(s): AMMONIA in the last 168 hours. CBC:  Recent Labs Lab 08/02/14 1402 08/03/14 0010  WBC 6.6 6.4  NEUTROABS 4.8 4.5  HGB 13.4 11.4*  HCT 42.6 36.5  MCV 87.8 88.0  PLT 194 185   Cardiac Enzymes:  Recent Labs Lab 08/02/14 1402 08/02/14 1934 08/03/14 0010 08/03/14 0508  TROPONINI 0.04* 0.04* 0.04* 0.05*   BNP (last 3 results)  Recent Labs  08/02/14 1407  BNP 2079.5*    ProBNP (last 3 results) No results for input(s): PROBNP in the last 8760 hours.  CBG: No results for input(s): GLUCAP in the last 168 hours.  No results found for this or any previous visit (from the past 240 hour(s)).   Studies:  Recent x-ray studies have been reviewed in detail by the Attending Physician  Scheduled Meds:  Scheduled Meds: . amLODipine  10 mg Oral Daily  . antiseptic oral rinse  7 mL Mouth Rinse BID  . aspirin EC  81 mg Oral Daily  . atorvastatin  20 mg Oral q1800  . carvedilol  6.25 mg Oral BID WC  . feeding supplement (GLUCERNA SHAKE)  237 mL Oral TID BM  . [START ON 08/06/2014] furosemide  40 mg Intravenous Daily  . hydrALAZINE  50 mg Oral 4 times per day  . isosorbide mononitrate  30 mg Oral Daily  . Linaclotide  145 mcg Oral Daily  . multivitamin with minerals  1 tablet Oral Daily  . sodium chloride  3 mL Intravenous Q12H   Continuous  Infusions:   Time spent on care of this patient: 35 min   [place], MD 08/05/2014, 10:52 AM  LOS: 3 days   [facility] Office  [phone] Pager - Text Page per [email or web address]  If 7PM-7AM, please contact night-coverage [email or web address]

## 2014-08-05 NOTE — Evaluation (Signed)
Clinical/Bedside Swallow Evaluation Patient Details  Name: Monica Rhodes MRN: [id number] Date of Birth: [birth year]  Today's Date: 08/05/2014 Time: SLP Start Time (ACUTE ONLY): 2595 SLP Stop Time (ACUTE ONLY): 1539 SLP Time Calculation (min) (ACUTE ONLY): 12 min  Past Medical History:  Past Medical History  Diagnosis Date  . Hypertension   . UTI (urinary tract infection)   . SBO (small bowel obstruction)     Recurrent, prolonged hospitalization November, 2012  . Protein calorie malnutrition   . Hyperlipidemia   . DM (diabetes mellitus)   . COPD (chronic obstructive pulmonary disease)   . PVD (peripheral vascular disease)   . Colon cancer   . Bradycardia   . Tobacco abuse   . CAD (coronary artery disease)     Non-STEMI November, 2012,, Cardiac catheterization was attempted, despite significant efforts catheters could not be progressed to the coronary arteries ., Therefore medical therapy  . Carotid artery disease     Doppler  April 16, 2011,, total occlusion LICA, 63-87% R. ICA  . PAD (peripheral artery disease)     Suggestions significant stenosis distal aorta by CT angiogram  November, 2012  . Ejection fraction     EF 60%, echo, November, 2012,  . Mitral regurgitation     Moderate, echo, November, 2012  . Right ventricular dysfunction     Moderate, echo, November, 2012  . Edema     December, 2012  . Rash     Rash  probably related to hydralazine  December, 2012   Past Surgical History:  Past Surgical History  Procedure Laterality Date  . Colostomy    . Abdominoperineal proctocolectomy    . Left heart catheterization with coronary angiogram N/A 04/24/2011    Procedure: LEFT HEART CATHETERIZATION WITH CORONARY ANGIOGRAM;  Surgeon: Monica Hector, MD;  Location: [facility] CATH LAB;  Service: Cardiovascular;  Laterality: N/A;   HPI:  81 y.o. female with HTN, afib, admitted with increasing SOB due to acute systolic CHF; orthopnea and severe pedal edema. Per chart, there are new  concerns for aspiration, so SLP eval ordered.    Assessment / Plan / Recommendation Clinical Impression  Pt presents with functional oropharyngeal swallow with adequate mastication, swift swallow response, and no s/s of aspiration.  Her son describes single episode of difficulty swallowing a pill yesterday, but no issues before or after.  Recommend continuing current diet - regular, thin liquids.  No SLP f/u warranted.     Aspiration Risk  Mild    Diet Recommendation Regular;Thin liquid   Liquid Administration via: Cup;Straw Medication Administration: Whole meds with liquid Supervision: Patient able to self feed    Other  Recommendations Oral Care Recommendations: Oral care BID   Follow Up Recommendations  None      Swallow Study Prior Functional Status       General HPI: 81 y.o. female with HTN, afib, admitted with increasing SOB due to acute systolic CHF; orthopnea and severe pedal edema. Per chart, there are new concerns for aspiration, so SLP eval ordered.  Type of Study: Bedside swallow evaluation Previous Swallow Assessment: none per records Diet Prior to this Study: Regular;Thin liquids Temperature Spikes Noted: No Respiratory Status: Nasal cannula Behavior/Cognition: Alert;Cooperative Oral Cavity - Dentition: Adequate natural dentition Self-Feeding Abilities: Able to feed self Patient Positioning: Upright in bed Baseline Vocal Quality: Clear Volitional Cough: Strong Volitional Swallow: Able to elicit    Oral/Motor/Sensory Function Overall Oral Motor/Sensory Function: Appears within functional limits for tasks assessed   [employer]  Rhodes: Within functional limits Presentation: Self Fed   Thin Liquid Thin Liquid: Within functional limits Presentation: Straw;Self Fed    Nectar Thick Nectar Thick Liquid: Not tested   Honey Thick Honey Thick Liquid: Not tested   Puree Puree: Within functional limits Presentation: [place]. Monica Rhodes, [place]  CCC/SLP Pager [phone]     Solid: Within functional limits       Monica Rhodes 08/05/2014,3:47 PM

## 2014-08-06 LAB — BLOOD GAS, ARTERIAL
Acid-Base Excess: 10.5 mmol/L — ABNORMAL HIGH (ref 0.0–2.0)
Bicarbonate: 34.8 mEq/L — ABNORMAL HIGH (ref 20.0–24.0)
Drawn by: 252031
O2 Content: 2 L/min
O2 Saturation: 97.4 %
Patient temperature: 98.6
TCO2: 36.3 mmol/L (ref 0–100)
pCO2 arterial: 49.1 mmHg — ABNORMAL HIGH (ref 35.0–45.0)
pH, Arterial: 7.464 — ABNORMAL HIGH (ref 7.350–7.450)
pO2, Arterial: 93.3 mmHg (ref 80.0–100.0)

## 2014-08-06 LAB — BASIC METABOLIC PANEL
Anion gap: 10 (ref 5–15)
BUN: 48 mg/dL — ABNORMAL HIGH (ref 6–23)
CO2: 32 mmol/L (ref 19–32)
Calcium: 8.3 mg/dL — ABNORMAL LOW (ref 8.4–10.5)
Chloride: 90 mmol/L — ABNORMAL LOW (ref 96–112)
Creatinine, Ser: 1.72 mg/dL — ABNORMAL HIGH (ref 0.50–1.10)
GFR calc Af Amer: 30 mL/min — ABNORMAL LOW (ref >90)
GFR, EST NON AFRICAN AMERICAN: 26 mL/min — AB (ref >90)
Glucose, Bld: 92 mg/dL (ref 70–99)
POTASSIUM: 4.1 mmol/L (ref 3.5–5.1)
SODIUM: 132 mmol/L — AB (ref 135–145)

## 2014-08-06 MED ORDER — ENOXAPARIN SODIUM 30 MG/0.3ML ~~LOC~~ SOLN
30.0000 mg | Freq: Every day | SUBCUTANEOUS | Status: DC
  Administered 2014-08-06 – 2014-08-11 (×6): 30 mg via SUBCUTANEOUS
  Filled 2014-08-06 (×7): qty 0.3

## 2014-08-06 MED ORDER — LORAZEPAM 0.5 MG PO TABS
0.5000 mg | ORAL_TABLET | Freq: Once | ORAL | Status: AC
  Administered 2014-08-07: 0.5 mg via ORAL
  Filled 2014-08-06: qty 1

## 2014-08-06 MED ORDER — SODIUM CHLORIDE 0.9 % IV SOLN
INTRAVENOUS | Status: DC
  Administered 2014-08-06: 14:00:00 via INTRAVENOUS

## 2014-08-06 MED ORDER — SODIUM CHLORIDE 0.9 % IV BOLUS (SEPSIS)
250.0000 mL | Freq: Once | INTRAVENOUS | Status: AC
  Administered 2014-08-06: 250 mL via INTRAVENOUS

## 2014-08-06 NOTE — Progress Notes (Signed)
Patient restless and unable to get comfortable tonight, despite staffs many attempts to help patient reposition herself.  Patient denies any pain or increased SOB. Vitals taken, all are within WNL.  MD on call notified, received orders for STAT ABG, results WNL.  Received order for one time dose Ativan, will administer and continue to monitor. Blood pressure 118/41, pulse 93, temperature 98.2 F (36.8 C), temperature source Oral, resp. rate 18, height 5\' 1"  (1.549 m), weight 48.807 kg (107 lb 9.6 oz), SpO2 97 %. Tresa Endo

## 2014-08-06 NOTE — Progress Notes (Signed)
PT Cancellation Note  Patient Details Name: Monica Rhodes MRN: [id number] DOB: [birth year]   Cancelled Treatment:    Reason Eval/Treat Not Completed: Fatigue/lethargy limiting ability to participate.  Pt indicates feeling very fatigued and not feeling well at this time.  Per family in room, pt was sitting in chair for breakfast and has just been very sleepy all morning.  Will f/u another time.     Kenyonna Micek, Thornton Papas 08/06/2014, 10:42 AM

## 2014-08-06 NOTE — Progress Notes (Addendum)
Patient Name: Monica Rhodes Date of Encounter: 08/06/2014  Primary Cardiologist: Dr. Ron Parker   Principal Problem:   Acute systolic CHF (congestive heart failure) Active Problems:   History of colectomy   Hypertension   CAD (coronary artery disease)   Malnutrition of moderate degree   Atrial flutter   Pericardial effusion    SUBJECTIVE  Appears to be very weak this morning. States she does not feel very good today. Continue to complain of dyspnea today. No CP.   CURRENT MEDS . amLODipine  10 mg Oral Daily  . antiseptic oral rinse  7 mL Mouth Rinse BID  . aspirin EC  81 mg Oral Daily  . atorvastatin  20 mg Oral q1800  . carvedilol  6.25 mg Oral BID WC  . feeding supplement (GLUCERNA SHAKE)  237 mL Oral TID BM  . hydrALAZINE  50 mg Oral 4 times per day  . isosorbide mononitrate  30 mg Oral Daily  . Linaclotide  145 mcg Oral Daily  . multivitamin with minerals  1 tablet Oral Daily  . sodium chloride  250 mL Intravenous Once  . sodium chloride  3 mL Intravenous Q12H    OBJECTIVE  Filed Vitals:   08/05/14 2025 08/06/14 0500 08/06/14 0906 08/06/14 1104  BP:  149/54 112/56 154/42  Pulse: 76 100  76  Temp: 97.7 F (36.5 C) 97.9 F (36.6 C)    TempSrc: Oral Oral    Resp: 18 18    Height:      Weight:  107 lb 9.6 oz (48.807 kg)    SpO2: 96% 94% 93% 89%    Intake/Output Summary (Last 24 hours) at 08/06/14 1226 Last data filed at 08/06/14 1152  Gross per 24 hour  Intake    480 ml  Output   1900 ml  Net  -1420 ml   Filed Weights   08/04/14 0451 08/05/14 0500 08/06/14 0500  Weight: 112 lb 14 oz (51.2 kg) 108 lb 14.5 oz (49.4 kg) 107 lb 9.6 oz (48.807 kg)    PHYSICAL EXAM  General: Pleasant, NAD. Neuro: Alert and oriented X 3. Moves all extremities spontaneously. Psych: Normal affect. HEENT:  Normal  Neck: Supple without bruits or JVD. Lungs:  Resp regular and unlabored, CTA. No obvious rale.  Heart: RRR no s3, s4, or murmurs. Abdomen: Soft, non-distended, BS  + x 4. Colostomy bad present, no sign of infection. Extremities: No clubbing, cyanosis or edema. Wound dressing present  Accessory Clinical Findings  CBC No results for input(s): WBC, NEUTROABS, HGB, HCT, MCV, PLT in the last 72 hours. Basic Metabolic Panel  Recent Labs  08/05/14 0433 08/06/14 0341  NA 138 132*  K 3.7 4.1  CL 95* 90*  CO2 35* 32  GLUCOSE 108* 92  BUN 44* 48*  CREATININE 1.77* 1.72*  CALCIUM 8.7 8.3*    TELE Aflutter with HR 80-90s    ECG  No new EKG  Echocardiogram 08/03/2014  - Left ventricle: Diffuse hypokinesis with inferior wall worse. The cavity size was severely dilated. Wall thickness was normal. The estimated ejection fraction was 35%. - Mitral valve: There was mild regurgitation. - Left atrium: The appendage was moderately to severely dilated. - Right ventricle: Moderate circumferential effusion . No respirometer or phased doppler done. Howerve there dose appear to be an underfilled RV with diastolic collapse and dilated IVC with blunted contraction with inspiration. Suggestive of tamponade physiology Clinical correlation suggested. - Atrial septum: There was a patent foramen ovale. - Pericardium, extracardiac:  Moderate circumferential effusion see above. There was a left pleural effusion.     Radiology/Studies  Dg Chest 2 View  08/04/2014   CLINICAL DATA:  81 year old female with a history of pulmonary edema and weakness.  EXAM: CHEST - 2 VIEW  COMPARISON:  08/02/2014, CT abdomen 04/16/2012, plain film 04/17/2011 and 04/14/2011  FINDINGS: Cardiomediastinal silhouette unchanged in size and contour. Cardiomegaly persists.  Atherosclerotic calcifications of the aortic arch.  Dense opacities at the bilateral lung bases with obscuration of the hemidiaphragms.  Interlobular septal thickening.  Diffuse osteopenia. Degenerative changes of the bilateral shoulders.  IMPRESSION: Persisting pulmonary edema and bibasilar pleural effusions  with associated atelectasis.  Cardiomegaly.  Atherosclerosis  Signed,  Dulcy Fanny. Earleen Newport, DO  [facility]  [facility]   Electronically Signed   By: Corrie Mckusick D.O.   On: 08/04/2014 09:12   Dg Chest 2 View  08/02/2014   CLINICAL DATA:  High blood pressure and shortness of breath for 2 days.  EXAM: CHEST  2 VIEW  COMPARISON:  04/17/2011  FINDINGS: The heart is enlarged. There is tortuosity, ectasia and calcification of the thoracic aorta. There are bilateral pleural effusions, right greater than left and interstitial pulmonary edema. The bony thorax is intact  IMPRESSION: CHF with bilateral effusions, right greater than left.   Electronically Signed   By: Marijo Sanes M.D.   On: 08/02/2014 15:14    ASSESSMENT AND PLAN  1. Acute systolic heart failure  - Echo 08/03/2014 EF 35%, mild MR, moderate pericardial effusion, +PFO  - lasix stopped this AM as she had severe weakness, given 250 cc bolus of fluid  - weight down from 114 to 107, likely her baseline  - will hold further lasix for now. Unfortunately she is very thin and frail and prone to over diuresis.    2. HTN 3. Aflutter, rate controlled  - no anticoagulation at this time because of pericardial effusion 4. Pericardial effusion  - need outpatient repeat echo in 1 week 5. H/o MI in 2012, unable to gain access via R radial or both femoral arteries despite multiple attempt, medical management 6. Significant orthostatic hypotension: SBP dropped from 146 to 100 upon standing  Signed, Woodward Ku Pager: [phone] Patient seen and examined. I agree with the assessment and plan as detailed above. See also my additional thoughts below.   Hopefully the pericardial effusion is resolving. I do not feel a need to repeat her echo today. However will be important that the patient is not over diuresed. In this time she has significant drop in her blood pressure when moved from the lying to sitting  position. She feels poorly with this. I have instructed the nurse not to have her stand to complete the orthostatic checks. She is received a bolus of fluid IV. I will put her on a small amount of hourly replacement fluid to watch her carefully.  Dola Argyle, MD, [facility] 08/06/2014 12:58 PM

## 2014-08-06 NOTE — Progress Notes (Signed)
[facility] Progress Note   Monica Rhodes ZOX:[id number] DOB: [birth year] DOA: 08/02/2014 PCP: Elyn Peers, MD  Brief narrative: Monica Rhodes is a 81 y.o. female with HTN and A-fib presenting with dyspnea, orthopnea and severe pedal edema. I had a long discussion with her and her husband about her medications - they initially believed that she had been taking Lasix at home and this was discontinued about 1 mo ago and she was switched to [place], but after reviewing all of her medications (old and new) it appears she was previously on Triamterene/ HCTZ alone with Amlodipine (separately) which was switched to Tribenzor about 1 mo ago. The husband feels that her BP is much better controlled on Tribenzor that it had been in the past.   Subjective: Feeling weak today. Having trouble ambulating.   Assessment/Plan: Principal Problem:   Acute systolic congestive heart failure - Echo report reveals that she has systolic heart failure with an EF of 35% - 8000 cc negative balance- pulse ox 89% on room air  - started IS- patient is not moving around much  Active Problems: Orthostatic hypotension/ severe weakness - will give bolus of NS - 250 cc- stopped Lasix this AM  Possible tamponade/moderate pericardial effusion -Reviewed echo report today which has findings that may be consistent with tamponade on. The patient does not not clinically fit the picture of cardiac tamponade -cardiology recommending to repeat ECHO in 1wk  Mildly elevated Troponin - likely in related to CHF- no chest pain    Atrial flutter -presuming this is a new diagnosis- noted incidentally on EKG on admission - no h/o palpitations- no record of this in chart and no sign of it on old EKGs that are available in Epic - cont Coreg for rate control  -Have started Lovenox for anticoagulation- cardiology recommends to avoid anticoagulation at this time- have stopped Lovenox  Aspiration? Dysphagia? - SLP eval ordered-  no obvious signs of aspiration- cont regular diet with thin liquids  Bradycardia HR dropped into 30s and 2.19 sec pause noted on tele- held Clonidine and Digoxin and decreased the dose of Metoprolol - heart rate is currently stable  - continuing to hold clonidine and digoxin- now on Coreg per cadiology  Acute renal failure on CKD 3 - may be due to Lasix- cont to follow closely- holding ARB- on hydralazine and Imdur for afterload reduction      Hypertension - apparently was difficult to control at home but appears to be reasonably controlled here - due to bradycardia, have had to hold Clonidine and due to renal failure, holding ARB - added Hydralazine and Imdur in place of above mentioned held medications    Code Status: full code Family Communication: husband Disposition Plan: home when stable-  DVT prophylaxis:  Lovenox  Consultants: cardiology  Procedures: none  Antibiotics: Anti-infectives    None      Objective: Filed Weights   08/04/14 0451 08/05/14 0500 08/06/14 0500  Weight: 51.2 kg (112 lb 14 oz) 49.4 kg (108 lb 14.5 oz) 48.807 kg (107 lb 9.6 oz)    Intake/Output Summary (Last 24 hours) at 08/06/14 1223 Last data filed at 08/06/14 1152  Gross per 24 hour  Intake    480 ml  Output   1900 ml  Net  -1420 ml     Vitals Filed Vitals:   08/05/14 2025 08/06/14 0500 08/06/14 0906 08/06/14 1104  BP:  149/54 112/56 154/42  Pulse: 76 100  76  Temp: 97.7 F (36.5 C) 97.9  F (36.6 C)    TempSrc: Oral Oral    Resp: 18 18    Height:      Weight:  48.807 kg (107 lb 9.6 oz)    SpO2: 96% 94% 93% 89%    Exam: General: AAO x3, No acute respiratory distress Lungs: Clear to auscultation bilaterally without wheezes or crackles Cardiovascular: Regular rate and rhythm without murmur gallop or rub normal S1 and S2 Abdomen: Nontender, nondistended, soft, bowel sounds positive, no rebound, no ascites, no appreciable mass Extremities: No significant cyanosis, clubbing,  no further pedal edema  Data Reviewed: Basic Metabolic Panel:  Recent Labs Lab 08/02/14 1402 08/03/14 0010 08/04/14 0425 08/05/14 0433 08/06/14 0341  NA 141 140 141 138 132*  K 3.9 3.5 3.4* 3.7 4.1  CL 103 101 99 95* 90*  CO2 34* 34* 36* 35* 32  GLUCOSE 124* 172* 121* 108* 92  BUN 41* 43* 41* 44* 48*  CREATININE 1.28* 1.50* 1.57* 1.77* 1.72*  CALCIUM 9.1 8.7 8.7 8.7 8.3*   Liver Function Tests:  Recent Labs Lab 08/02/14 1402  AST 25  ALT 9  ALKPHOS 38*  BILITOT 0.4  PROT 6.0  ALBUMIN 2.9*   No results for input(s): LIPASE, AMYLASE in the last 168 hours. No results for input(s): AMMONIA in the last 168 hours. CBC:  Recent Labs Lab 08/02/14 1402 08/03/14 0010  WBC 6.6 6.4  NEUTROABS 4.8 4.5  HGB 13.4 11.4*  HCT 42.6 36.5  MCV 87.8 88.0  PLT 194 185   Cardiac Enzymes:  Recent Labs Lab 08/02/14 1402 08/02/14 1934 08/03/14 0010 08/03/14 0508  TROPONINI 0.04* 0.04* 0.04* 0.05*   BNP (last 3 results)  Recent Labs  08/02/14 1407  BNP 2079.5*    ProBNP (last 3 results) No results for input(s): PROBNP in the last 8760 hours.  CBG: No results for input(s): GLUCAP in the last 168 hours.  No results found for this or any previous visit (from the past 240 hour(s)).   Studies:  Recent x-ray studies have been reviewed in detail by the Attending Physician  Scheduled Meds:  Scheduled Meds: . amLODipine  10 mg Oral Daily  . antiseptic oral rinse  7 mL Mouth Rinse BID  . aspirin EC  81 mg Oral Daily  . atorvastatin  20 mg Oral q1800  . carvedilol  6.25 mg Oral BID WC  . feeding supplement (GLUCERNA SHAKE)  237 mL Oral TID BM  . hydrALAZINE  50 mg Oral 4 times per day  . isosorbide mononitrate  30 mg Oral Daily  . Linaclotide  145 mcg Oral Daily  . multivitamin with minerals  1 tablet Oral Daily  . sodium chloride  250 mL Intravenous Once  . sodium chloride  3 mL Intravenous Q12H   Continuous Infusions:   Time spent on care of this patient:  35 min   Gorman, MD 08/06/2014, 12:23 PM  LOS: 4 days   [facility] Office  [phone] Pager - Text Page per [email or web address]  If 7PM-7AM, please contact night-coverage [email or web address]

## 2014-08-07 NOTE — Progress Notes (Signed)
Paged by nurse as patient's daughter want an update. Per nursing staff, IM wish cardiology to explain to the daughter regarding recent progress. I have talked with the patient's daughter, all questions answered. She is satisfied with our answer. I have explained the fact patient is very prone to overdiuresis given her small body size. She appears to be much better after IVF. IVF stopped today to avoid over hydration. Expect her to start on PO lasix tomorrow before discharge.   I have discussed with her regarding need to restrict Na and limit daily fluid intake to <2L (maybe even less than that given her small body size), need for daily weight (call cardiology if weight increase >3 lbs overnight or 5 lbs in a single, which again maybe less for her given her small size). Daughter very appreciative of the call back.  Hilbert Corrigan PA Pager: [phone]

## 2014-08-07 NOTE — Progress Notes (Addendum)
08/07/2014 at 10:00 AM  I entered pt room to find pt lying in bed with no oxygen on and appeared to be in distress. Pt was SOB at rest. Pt stated that a Dr. came in and told her she did not need the oxygen. I then proceded to take vitals and saw that her O2 sat was fluctuating from 87-89% on RA. I then put the pt on 2 L [place] and her O2 saturations went up to 95% and she was able to speak more clearly and seemed more alert. Order says to put O2 on to keep saturations above 92%. Pt had orders for orthostatic vitals however after assessing her status she was not in condition to complete them at ordered time. I will follow up at 1200 for that ordered orthostatic vital time to reassess her condition as well as round hourly for her O2 saturations.

## 2014-08-07 NOTE — Progress Notes (Signed)
[facility] Progress Note   Monica Rhodes DGL:[id number] DOB: 1927/12/05 DOA: 08/02/2014 PCP: Elyn Peers, MD  Brief narrative: Monica Rhodes is a 81 y.o. female with HTN and A-fib presenting with dyspnea, orthopnea and severe pedal edema. I had a long discussion with her and her husband about her medications - they initially believed that she had been taking Lasix at home and this was discontinued about 1 mo ago and she was switched to [place], but after reviewing all of her medications (old and new) it appears she was previously on Triamterene/ HCTZ alone with Amlodipine (separately) which was switched to Tribenzor about 1 mo ago. The husband feels that her BP is much better controlled on Tribenzor that it had been in the past.   Subjective: Feeling much better today.   Assessment/Plan: Principal Problem:   Acute systolic congestive heart failure - Echo report reveals that she has systolic heart failure with an EF of 35% - 8000 cc negative balance-became orthostatic- has been hydrated overnight by cardiology and now euvolemic -  pulse ox 89-90% on room air - this is likely due to atelectasis and I have encouraged her ambulate more today - started IS-  - cardiology recommended resuming Lasix tomorrow - no further fluids today  Active Problems: Orthostatic hypotension/ severe weakness - hydrated yesterday and now improved  Possible tamponade/moderate pericardial effusion -Reviewed echo report today which has findings that may be consistent with tamponade on. The patient does not not clinically fit the picture of cardiac tamponade -cardiology recommending to repeat ECHO in 1wk  Mildly elevated Troponin - likely in related to CHF- no chest pain    Atrial flutter -presuming this is a new diagnosis- noted incidentally on EKG on admission - no h/o palpitations- no record of this in chart and no sign of it on old EKGs that are available in Epic - cont Coreg for rate control   -Have started Lovenox for anticoagulation- cardiology recommends to avoid anticoagulation at this time- have stopped Lovenox  Aspiration? Dysphagia? - SLP eval ordered- no obvious signs of aspiration- cont regular diet with thin liquids  Bradycardia HR dropped into 30s and 2.19 sec pause noted on tele- held Clonidine and Digoxin and decreased the dose of Metoprolol - heart rate is currently stable  - continuing to hold clonidine and digoxin- now on Coreg per cadiology  Acute renal failure on CKD 3 - may be due to Lasix- cont to follow closely- holding ARB- on hydralazine and Imdur for afterload reduction      Hypertension - apparently was difficult to control at home but appears to be reasonably controlled here - due to bradycardia, have had to hold Clonidine and due to renal failure, holding ARB - added Hydralazine and Imdur in place of above mentioned held medications    Code Status: full code Family Communication: husband Disposition Plan: home when stable-  DVT prophylaxis:  Lovenox  Consultants: cardiology  Procedures: none  Antibiotics: Anti-infectives    None      Objective: Filed Weights   08/05/14 0500 08/06/14 0500 08/07/14 0612  Weight: 49.4 kg (108 lb 14.5 oz) 48.807 kg (107 lb 9.6 oz) 50.485 kg (111 lb 4.8 oz)    Intake/Output Summary (Last 24 hours) at 08/07/14 1155 Last data filed at 08/07/14 1004  Gross per 24 hour  Intake 2600.5 ml  Output    250 ml  Net 2350.5 ml     Vitals Filed Vitals:   08/07/14 0903 08/07/14 1000 08/07/14 1001 08/07/14  1140  BP:  136/62    Pulse:  75    Temp:  98.3 F (36.8 C)    TempSrc:  Oral    Resp:      Height:      Weight:      SpO2: 90% 87% 95% 99%    Exam: General: AAO x3, No acute respiratory distress Lungs: Clear to auscultation bilaterally without wheezes or crackles Cardiovascular: Regular rate and rhythm without murmur gallop or rub normal S1 and S2 Abdomen: Nontender, nondistended, soft, bowel  sounds positive, no rebound, no ascites, no appreciable mass Extremities: No significant cyanosis, clubbing, no further pedal edema  Data Reviewed: Basic Metabolic Panel:  Recent Labs Lab 08/02/14 1402 08/03/14 0010 08/04/14 0425 08/05/14 0433 08/06/14 0341  NA 141 140 141 138 132*  K 3.9 3.5 3.4* 3.7 4.1  CL 103 101 99 95* 90*  CO2 34* 34* 36* 35* 32  GLUCOSE 124* 172* 121* 108* 92  BUN 41* 43* 41* 44* 48*  CREATININE 1.28* 1.50* 1.57* 1.77* 1.72*  CALCIUM 9.1 8.7 8.7 8.7 8.3*   Liver Function Tests:  Recent Labs Lab 08/02/14 1402  AST 25  ALT 9  ALKPHOS 38*  BILITOT 0.4  PROT 6.0  ALBUMIN 2.9*   No results for input(s): LIPASE, AMYLASE in the last 168 hours. No results for input(s): AMMONIA in the last 168 hours. CBC:  Recent Labs Lab 08/02/14 1402 08/03/14 0010  WBC 6.6 6.4  NEUTROABS 4.8 4.5  HGB 13.4 11.4*  HCT 42.6 36.5  MCV 87.8 88.0  PLT 194 185   Cardiac Enzymes:  Recent Labs Lab 08/02/14 1402 08/02/14 1934 08/03/14 0010 08/03/14 0508  TROPONINI 0.04* 0.04* 0.04* 0.05*   BNP (last 3 results)  Recent Labs  08/02/14 1407  BNP 2079.5*    ProBNP (last 3 results) No results for input(s): PROBNP in the last 8760 hours.  CBG: No results for input(s): GLUCAP in the last 168 hours.  No results found for this or any previous visit (from the past 240 hour(s)).   Studies:  Recent x-ray studies have been reviewed in detail by the Attending Physician  Scheduled Meds:  Scheduled Meds: . amLODipine  10 mg Oral Daily  . antiseptic oral rinse  7 mL Mouth Rinse BID  . aspirin EC  81 mg Oral Daily  . atorvastatin  20 mg Oral q1800  . carvedilol  6.25 mg Oral BID WC  . enoxaparin (LOVENOX) injection  30 mg Subcutaneous Daily  . feeding supplement (GLUCERNA SHAKE)  237 mL Oral TID BM  . hydrALAZINE  50 mg Oral 4 times per day  . isosorbide mononitrate  30 mg Oral Daily  . Linaclotide  145 mcg Oral Daily  . multivitamin with minerals  1  tablet Oral Daily  . sodium chloride  3 mL Intravenous Q12H   Continuous Infusions:   Time spent on care of this patient: 35 min   [place], MD 08/07/2014, 11:55 AM  LOS: 5 days   [facility] Office  [phone] Pager - Text Page per [email or web address]  If 7PM-7AM, please contact night-coverage [email or web address]

## 2014-08-07 NOTE — Progress Notes (Signed)
Physical Therapy Treatment Patient Details Name: Monica Rhodes MRN: [id number] DOB: Dec 21, 1927 Today's Date: 08/07/2014    History of Present Illness 81 YO FEMALE ADMITTED 08/02/14 with  increased SOB, bilateral leg swelling, acute systolic CHF, EF 88%, h/o colostomy, afib.    PT Comments    Pt reported she was feeling better today. Pt continues to be limited by dyspnea with activity. Pt was able to tolerate walking 36ft with min guard assist on RA. Pt's O2 sats 89-91% on RA with gait. Pt did need and extended recovery time due to dyspnea. Pt continues to benefit from acute skilled PT until D/C home with her husband providing 24 hour S/min assist. Recommend HHPT.  Follow Up Recommendations  Home health PT;Supervision/Assistance - 24 hour     Equipment Recommendations  None recommended by PT    Recommendations for Other Services       Precautions / Restrictions Precautions Precaution Comments: colostomy, monitor sats Restrictions Weight Bearing Restrictions: No    Mobility  Bed Mobility Overal bed mobility: Needs Assistance Bed Mobility: Supine to Sit;Sit to Supine     Supine to sit: Supervision;HOB elevated Sit to supine: Min assist;HOB elevated   General bed mobility comments: min assist to lift legs back into bed.  Transfers Overall transfer level: Needs assistance Equipment used: Rolling walker (2 wheeled) Transfers: Sit to/from Stand Sit to Stand: Min guard         General transfer comment: cues for safety and hand placement.  Ambulation/Gait Ambulation/Gait assistance: Min guard Ambulation Distance (Feet): 23 Feet (23'x1, 15'x1) Assistive device: Rolling walker (2 wheeled) Gait Pattern/deviations: Step-through pattern;Decreased stride length Gait velocity: decreased.   General Gait Details: pt very SOB with activity. O2 sats 89-91% on RA. Pt needed 5 min recovery time after first walk. Educated on pursed lip breathing, but pt was having difficulty  following the technique even with demonstration.   Stairs            Wheelchair Mobility    Modified Rankin (Stroke Patients Only)       Balance Overall balance assessment: Needs assistance Sitting-balance support: No upper extremity supported Sitting balance-Leahy Scale: Good       Standing balance-Leahy Scale: Fair                      Cognition Arousal/Alertness: Awake/alert Behavior During Therapy: Flat affect Overall Cognitive Status: Within Functional Limits for tasks assessed                      Exercises Total Joint Exercises Long Arc Quad: AROM;Strengthening;Both;5 reps;Seated (pt requested back to bed due to fatigue)    General Comments General comments (skin integrity, edema, etc.): Pt reported her butt is very sore and requested to be postioned on her side in the bed.      Pertinent Vitals/Pain Pain Assessment: No/denies pain    Home Living                      Prior Function            PT Goals (current goals can now be found in the care plan section) Progress towards PT goals: Progressing toward goals    Frequency  Min 3X/week    PT Plan Current plan remains appropriate    Co-evaluation             End of Session Equipment Utilized During Treatment: Gait belt Activity Tolerance: Patient limited by  fatigue Patient left: in bed;with call bell/phone within reach;with family/visitor present     Time: 0831-0903 PT Time Calculation (min) (ACUTE ONLY): 32 min  Charges:  $Gait Training: 8-22 mins $Therapeutic Activity: 8-22 mins                    G Codes:      Monica Rhodes 08/07/2014, 9:11 AM

## 2014-08-07 NOTE — Progress Notes (Addendum)
Patient Name: Monica Rhodes Date of Encounter: 08/07/2014  Primary Cardiologist: Dr. Ron Parker   Principal Problem:   Acute systolic CHF (congestive heart failure) Active Problems:   History of colectomy   Hypertension   CAD (coronary artery disease)   Malnutrition of moderate degree   Atrial flutter   Pericardial effusion    SUBJECTIVE  Denies any CP or SOB. Feeling better compare to yesterday. Had O2 desat this morning  CURRENT MEDS . amLODipine  10 mg Oral Daily  . antiseptic oral rinse  7 mL Mouth Rinse BID  . aspirin EC  81 mg Oral Daily  . atorvastatin  20 mg Oral q1800  . carvedilol  6.25 mg Oral BID WC  . enoxaparin (LOVENOX) injection  30 mg Subcutaneous Daily  . feeding supplement (GLUCERNA SHAKE)  237 mL Oral TID BM  . hydrALAZINE  50 mg Oral 4 times per day  . isosorbide mononitrate  30 mg Oral Daily  . Linaclotide  145 mcg Oral Daily  . multivitamin with minerals  1 tablet Oral Daily  . sodium chloride  3 mL Intravenous Q12H    OBJECTIVE  Filed Vitals:   08/07/14 0612 08/07/14 0903 08/07/14 1000 08/07/14 1001  BP: 119/54  136/62   Pulse: 52  75   Temp: 97.5 F (36.4 C)  98.3 F (36.8 C)   TempSrc: Oral  Oral   Resp:      Height:      Weight: 111 lb 4.8 oz (50.485 kg)     SpO2: 95% 90% 87% 95%    Intake/Output Summary (Last 24 hours) at 08/07/14 1135 Last data filed at 08/07/14 1004  Gross per 24 hour  Intake 2600.5 ml  Output    550 ml  Net 2050.5 ml   Filed Weights   08/05/14 0500 08/06/14 0500 08/07/14 0612  Weight: 108 lb 14.5 oz (49.4 kg) 107 lb 9.6 oz (48.807 kg) 111 lb 4.8 oz (50.485 kg)    PHYSICAL EXAM  General: Pleasant, NAD. Neuro: Alert and oriented X 3. Moves all extremities spontaneously. Psych: Normal affect. HEENT:  Normal  Neck: Supple without bruits or JVD. Lungs:  Resp regular and unlabored, CTA. No obvious rale.  Heart: irregular. no s3, s4, or murmurs. Abdomen: Soft, non-distended, BS + x 4. Colostomy bad present,  no sign of infection. Extremities: No clubbing, cyanosis or edema. Wound dressing present  Accessory Clinical Findings  CBC No results for input(s): WBC, NEUTROABS, HGB, HCT, MCV, PLT in the last 72 hours. Basic Metabolic Panel  Recent Labs  08/05/14 0433 08/06/14 0341  NA 138 132*  K 3.7 4.1  CL 95* 90*  CO2 35* 32  GLUCOSE 108* 92  BUN 44* 48*  CREATININE 1.77* 1.72*  CALCIUM 8.7 8.3*    TELE Aflutter with HR 80-90s    ECG  No new EKG  Echocardiogram 08/03/2014  - Left ventricle: Diffuse hypokinesis with inferior wall worse. The cavity size was severely dilated. Wall thickness was normal. The estimated ejection fraction was 35%. - Mitral valve: There was mild regurgitation. - Left atrium: The appendage was moderately to severely dilated. - Right ventricle: Moderate circumferential effusion . No respirometer or phased doppler done. Howerve there dose appear to be an underfilled RV with diastolic collapse and dilated IVC with blunted contraction with inspiration. Suggestive of tamponade physiology Clinical correlation suggested. - Atrial septum: There was a patent foramen ovale. - Pericardium, extracardiac: Moderate circumferential effusion see above. There was a left pleural effusion.  Radiology/Studies  Dg Chest 2 View  08/04/2014   CLINICAL DATA:  81 year old female with a history of pulmonary edema and weakness.  EXAM: CHEST - 2 VIEW  COMPARISON:  08/02/2014, CT abdomen 04/16/2012, plain film 04/17/2011 and 04/14/2011  FINDINGS: Cardiomediastinal silhouette unchanged in size and contour. Cardiomegaly persists.  Atherosclerotic calcifications of the aortic arch.  Dense opacities at the bilateral lung bases with obscuration of the hemidiaphragms.  Interlobular septal thickening.  Diffuse osteopenia. Degenerative changes of the bilateral shoulders.  IMPRESSION: Persisting pulmonary edema and bibasilar pleural effusions with associated atelectasis.   Cardiomegaly.  Atherosclerosis  Signed,  Dulcy Fanny. Earleen Newport, DO  [facility]  [facility]   Electronically Signed   By: Corrie Mckusick D.O.   On: 08/04/2014 09:12   Dg Chest 2 View  08/02/2014   CLINICAL DATA:  High blood pressure and shortness of breath for 2 days.  EXAM: CHEST  2 VIEW  COMPARISON:  04/17/2011  FINDINGS: The heart is enlarged. There is tortuosity, ectasia and calcification of the thoracic aorta. There are bilateral pleural effusions, right greater than left and interstitial pulmonary edema. The bony thorax is intact  IMPRESSION: CHF with bilateral effusions, right greater than left.   Electronically Signed   By: Marijo Sanes M.D.   On: 08/02/2014 15:14    ASSESSMENT AND PLAN  1. Acute systolic heart failure  - Echo 08/03/2014 EF 35%, mild MR, moderate pericardial effusion, +PFO  - lasix stopped this AM as she had severe weakness, given 250 cc bolus of fluid  - weight down from 114 to 107, likely her baseline  - lasix on hold due to over diuresis, soft hydration overnight, appear to be feeling better today. Will discuss with Dr. Ron Parker regarding d/c IV fluid as she appears to be euvolemic at this time. Potentially restart PO lasix tomorrow. Expect discharge soon. May need home O2   2. HTN 3. Aflutter, rate controlled  - no anticoagulation at this time because of pericardial effusion 4. Pericardial effusion  - need outpatient repeat echo in 1 week 5. H/o MI in 2012, unable to gain access via R radial or both femoral arteries despite multiple attempt, medical management 6. Significant orthostatic hypotension: SBP dropped from 146 to 100 upon standing  - will need to allow for higher BP when lying down to avoid significant drop in BP upon standing  Signed, Woodward Ku Pager: [phone]   Patient seen and examined. I agree with the assessment and plan as detailed above. See also my additional thoughts below.   The patient is looking  much better. She was admitted volume overloaded. She was diureses but ultimately became drier than she could tolerate. She received some fluids back and is stable. It is now time to stop her fluids. We will probably want to start a low dose of oral diuretics tomorrow. Plan to continue her activities to get her stronger. It looks like she will need home O2.  I spoke with the patient's son in the room. He asked [place] to be very careful in communicating her discharge medications to her primary physician. I will ask my team today to make post hospital cardiology follow-up plans so that she is seen within the next week. This will be with me or my assisting team.  Dola Argyle, MD, [facility] 08/07/2014 11:49 AM

## 2014-08-08 DIAGNOSIS — I481 Persistent atrial fibrillation: Secondary | ICD-10-CM

## 2014-08-08 LAB — BASIC METABOLIC PANEL
ANION GAP: 4 — AB (ref 5–15)
BUN: 44 mg/dL — AB (ref 6–23)
CALCIUM: 8.4 mg/dL (ref 8.4–10.5)
CO2: 36 mmol/L — ABNORMAL HIGH (ref 19–32)
Chloride: 95 mmol/L — ABNORMAL LOW (ref 96–112)
Creatinine, Ser: 1.34 mg/dL — ABNORMAL HIGH (ref 0.50–1.10)
GFR calc non Af Amer: 35 mL/min — ABNORMAL LOW (ref >90)
GFR, EST AFRICAN AMERICAN: 41 mL/min — AB (ref >90)
GLUCOSE: 227 mg/dL — AB (ref 70–99)
POTASSIUM: 3.9 mmol/L (ref 3.5–5.1)
Sodium: 135 mmol/L (ref 135–145)

## 2014-08-08 LAB — CBC
HEMATOCRIT: 36.3 % (ref 36.0–46.0)
Hemoglobin: 11.5 g/dL — ABNORMAL LOW (ref 12.0–15.0)
MCH: 27.6 pg (ref 26.0–34.0)
MCHC: 31.7 g/dL (ref 30.0–36.0)
MCV: 87.3 fL (ref 78.0–100.0)
Platelets: 219 10*3/uL (ref 150–400)
RBC: 4.16 MIL/uL (ref 3.87–5.11)
RDW: 14.5 % (ref 11.5–15.5)
WBC: 5.5 10*3/uL (ref 4.0–10.5)

## 2014-08-08 NOTE — Progress Notes (Signed)
[facility] Progress Note   Monica Rhodes YNW:[id number] DOB: January 02, 1928 DOA: 08/02/2014 PCP: Elyn Peers, MD  Brief narrative: Monica Rhodes is a 81 y.o. female with HTN and A-fib presenting with dyspnea, orthopnea and severe pedal edema. I had a long discussion with her and her husband about her medications - they initially believed that she had been taking Lasix at home and this was discontinued about 1 mo ago and she was switched to [place], but after reviewing all of her medications (old and new) it appears she was previously on Triamterene/ HCTZ alone with Amlodipine (separately) which was switched to Tribenzor about 1 mo ago. The husband feels that her BP is much better controlled on Tribenzor that it had been in the past.   Subjective: No complaints - was able to walk to the door yesterday twice.   Assessment/Plan: Principal Problem:   Acute systolic congestive heart failure - Echo report reveals that she has systolic heart failure with an EF of 35% - 8000 cc negative balance-became orthostatic- has been hydrated overnight by cardiology and now euvolemic -  pulse ox 89-90% on room air - this is likely due to atelectasis and I have encouraged her ambulate more  - started IS-  - cardiology has decided today to call palliative care  Active Problems: Orthostatic hypotension/ severe weakness - hydrated yesterday and now improved- lasix on hold per cardiology  Possible tamponade/moderate pericardial effusion -Reviewed echo report today which has findings that may be consistent with tamponade on. The patient does not not clinically fit the picture of cardiac tamponade -cardiology recommending to repeat ECHO in 1wk  Mildly elevated Troponin - likely in related to CHF- no chest pain    Atrial flutter -presuming this is a new diagnosis- noted incidentally on EKG on admission - no h/o palpitations- no record of this in chart and no sign of it on old EKGs that are available  in Epic - cont Coreg for rate control  -Have started Lovenox for anticoagulation- cardiology recommends to avoid anticoagulation at this time- have stopped Lovenox  Aspiration? Dysphagia? - SLP eval ordered- no obvious signs of aspiration- cont regular diet with thin liquids  Bradycardia HR dropped into 30s and 2.19 sec pause noted on tele- held Clonidine and Digoxin and decreased the dose of Metoprolol - heart rate is currently stable  - continuing to hold clonidine and digoxin- now on Coreg per cadiology  Acute renal failure on CKD 3 - may be due to Lasix- cont to follow closely- holding ARB- on hydralazine and Imdur for afterload reduction      Hypertension - apparently was difficult to control at home but appears to be reasonably controlled here - due to bradycardia, have had to hold Clonidine and due to renal failure, holding ARB - added Hydralazine and Imdur in place of above mentioned held medications    Code Status: full code Family Communication: husband Disposition Plan: home when stable-  DVT prophylaxis:  Lovenox  Consultants: cardiology  Procedures: none  Antibiotics: Anti-infectives    None      Objective: Filed Weights   08/06/14 0500 08/07/14 0612 08/08/14 6578  Weight: 48.807 kg (107 lb 9.6 oz) 50.485 kg (111 lb 4.8 oz) 50.243 kg (110 lb 12.3 oz)    Intake/Output Summary (Last 24 hours) at 08/08/14 1237 Last data filed at 08/08/14 1048  Gross per 24 hour  Intake    840 ml  Output    450 ml  Net    390  ml     Vitals Filed Vitals:   08/07/14 1447 08/07/14 2003 08/08/14 0632 08/08/14 1010  BP: 110/54 141/54 108/59 156/55  Pulse: 78 73 70   Temp: 97.7 F (36.5 C) 97.7 F (36.5 C) 97.3 F (36.3 C)   TempSrc: Oral Oral Oral   Resp: 22 20 20    Height:      Weight:   50.243 kg (110 lb 12.3 oz)   SpO2: 97% 94% 96%     Exam: General: AAO x3, No acute respiratory distress Lungs: Clear to auscultation bilaterally without wheezes or  crackles Cardiovascular: Regular rate and rhythm without murmur gallop or rub normal S1 and S2 Abdomen: Nontender, nondistended, soft, bowel sounds positive, no rebound, no ascites, no appreciable mass Extremities: No significant cyanosis, clubbing, no further pedal edema  Data Reviewed: Basic Metabolic Panel:  Recent Labs Lab 08/02/14 1402 08/03/14 0010 08/04/14 0425 08/05/14 0433 08/06/14 0341  NA 141 140 141 138 132*  K 3.9 3.5 3.4* 3.7 4.1  CL 103 101 99 95* 90*  CO2 34* 34* 36* 35* 32  GLUCOSE 124* 172* 121* 108* 92  BUN 41* 43* 41* 44* 48*  CREATININE 1.28* 1.50* 1.57* 1.77* 1.72*  CALCIUM 9.1 8.7 8.7 8.7 8.3*   Liver Function Tests:  Recent Labs Lab 08/02/14 1402  AST 25  ALT 9  ALKPHOS 38*  BILITOT 0.4  PROT 6.0  ALBUMIN 2.9*   No results for input(s): LIPASE, AMYLASE in the last 168 hours. No results for input(s): AMMONIA in the last 168 hours. CBC:  Recent Labs Lab 08/02/14 1402 08/03/14 0010  WBC 6.6 6.4  NEUTROABS 4.8 4.5  HGB 13.4 11.4*  HCT 42.6 36.5  MCV 87.8 88.0  PLT 194 185   Cardiac Enzymes:  Recent Labs Lab 08/02/14 1402 08/02/14 1934 08/03/14 0010 08/03/14 0508  TROPONINI 0.04* 0.04* 0.04* 0.05*   BNP (last 3 results)  Recent Labs  08/02/14 1407  BNP 2079.5*    ProBNP (last 3 results) No results for input(s): PROBNP in the last 8760 hours.  CBG: No results for input(s): GLUCAP in the last 168 hours.  No results found for this or any previous visit (from the past 240 hour(s)).   Studies:  Recent x-ray studies have been reviewed in detail by the Attending Physician  Scheduled Meds:  Scheduled Meds: . amLODipine  10 mg Oral Daily  . antiseptic oral rinse  7 mL Mouth Rinse BID  . aspirin EC  81 mg Oral Daily  . atorvastatin  20 mg Oral q1800  . carvedilol  6.25 mg Oral BID WC  . enoxaparin (LOVENOX) injection  30 mg Subcutaneous Daily  . feeding supplement (GLUCERNA SHAKE)  237 mL Oral TID BM  . hydrALAZINE   50 mg Oral 4 times per day  . isosorbide mononitrate  30 mg Oral Daily  . Linaclotide  145 mcg Oral Daily  . multivitamin with minerals  1 tablet Oral Daily  . sodium chloride  3 mL Intravenous Q12H   Continuous Infusions:   Time spent on care of this patient: 35 min   [place], MD 08/08/2014, 12:37 PM  LOS: 6 days   [facility] Office  [phone] Pager - Text Page per [email or web address]  If 7PM-7AM, please contact night-coverage [email or web address]

## 2014-08-08 NOTE — Progress Notes (Signed)
SUBJECTIVE: The patient is "weak" today.  Her family thinks that she is"worse than yesterday". Remains SOB.   At this time, she denies chest pain or any new concerns.  Marland Kitchen amLODipine  10 mg Oral Daily  . antiseptic oral rinse  7 mL Mouth Rinse BID  . aspirin EC  81 mg Oral Daily  . atorvastatin  20 mg Oral q1800  . carvedilol  6.25 mg Oral BID WC  . enoxaparin (LOVENOX) injection  30 mg Subcutaneous Daily  . feeding supplement (GLUCERNA SHAKE)  237 mL Oral TID BM  . hydrALAZINE  50 mg Oral 4 times per day  . isosorbide mononitrate  30 mg Oral Daily  . Linaclotide  145 mcg Oral Daily  . multivitamin with minerals  1 tablet Oral Daily  . sodium chloride  3 mL Intravenous Q12H      OBJECTIVE: Physical Exam: Filed Vitals:   08/07/14 1447 08/07/14 2003 08/08/14 0632 08/08/14 1010  BP: 110/54 141/54 108/59 156/55  Pulse: 78 73 70   Temp: 97.7 F (36.5 C) 97.7 F (36.5 C) 97.3 F (36.3 C)   TempSrc: Oral Oral Oral   Resp: 22 20 20    Height:      Weight:   110 lb 12.3 oz (50.243 kg)   SpO2: 97% 94% 96%     Intake/Output Summary (Last 24 hours) at 08/08/14 1227 Last data filed at 08/08/14 1048  Gross per 24 hour  Intake    840 ml  Output    450 ml  Net    390 ml    Telemetry reveals afib  GEN- The patient is elderly and ill appearing, alert and oriented x 3 today.   Head- normocephalic, atraumatic Eyes-  Sclera clear, conjunctiva pink Ears- hearing intact Oropharynx- clear with dry MM Neck- supple  Lungs- Clear to ausculation bilaterally, normal work of breathing, wearing O2 Heart- irregular rate and rhythm  GI- soft, NT, ND, + BS Extremities- no clubbing, cyanosis, trace edema Skin- no rash or lesion Psych- euthymic mood, full affect Neuro- strength and sensation are intact  LABS: Basic Metabolic Panel:  Recent Labs  08/06/14 0341  NA 132*  K 4.1  CL 90*  CO2 32  GLUCOSE 92  BUN 48*  CREATININE 1.72*  CALCIUM 8.3*  RADIOLOGY: Dg Chest 2  View  08/04/2014   CLINICAL DATA:  81 year old female with a history of pulmonary edema and weakness.  EXAM: CHEST - 2 VIEW  COMPARISON:  08/02/2014, CT abdomen 04/16/2012, plain film 04/17/2011 and 04/14/2011  FINDINGS: Cardiomediastinal silhouette unchanged in size and contour. Cardiomegaly persists.  Atherosclerotic calcifications of the aortic arch.  Dense opacities at the bilateral lung bases with obscuration of the hemidiaphragms.  Interlobular septal thickening.  Diffuse osteopenia. Degenerative changes of the bilateral shoulders.  IMPRESSION: Persisting pulmonary edema and bibasilar pleural effusions with associated atelectasis.  Cardiomegaly.  Atherosclerosis  Signed,  Dulcy Fanny. Earleen Newport, DO  [facility]  [facility]   Electronically Signed   By: Corrie Mckusick D.O.   On: 08/04/2014 09:12   Dg Chest 2 View  08/02/2014   CLINICAL DATA:  High blood pressure and shortness of breath for 2 days.  EXAM: CHEST  2 VIEW  COMPARISON:  04/17/2011  FINDINGS: The heart is enlarged. There is tortuosity, ectasia and calcification of the thoracic aorta. There are bilateral pleural effusions, right greater than left and interstitial pulmonary edema. The bony thorax is intact  IMPRESSION: CHF with bilateral effusions,  right greater than left.   Electronically Signed   By: Marijo Sanes M.D.   On: 08/02/2014 15:14    ASSESSMENT AND PLAN:     1. Acute systolic heart failure - Echo 08/03/2014 EF 35%, mild MR, moderate pericardial effusion, +PFO   - fluid management has been difficult due to her fragility  2. HTN Check bmet and cbc  3. Aflutter, rate controlled - not a candidate for anticoagulation  4. Pericardial effusion - need outpatient repeat echo in 1 week  5. H/o MI in 2012, unable to gain access via R radial or both femoral arteries despite multiple attempt, medical management  Her prognosis is very poor I  had a long discussion with her family regarding this.  We agree that palliative options should be considered I will consult palliative care for goals of care     Thompson Grayer, MD 08/08/2014 12:27 PM

## 2014-08-08 NOTE — Progress Notes (Addendum)
Palliative consult received today and I have reviewed her chart in detail. Appropriate for consultation-she is high risk for readmission and has frailty with EF 35% and hypoxia probably from weakness and her moderate -large pleural effusions requiring O2. She is also a FULL CODE (unclear if this has been discussed effectively). She would actually most likely meet hospice criteria for her CHF severity but it really depends on her goals of care. This is her first hospitalization for heart failure-earlier intervention from a palliative standpoint is ideal. She has a very low albumin at 2.9 which is also a poor prognostic factor-may be nutritional or from proteinuria- may be worth exploring this for prognostic purposes- she would certainly be more prone to ortho stasis and over diuresis if she is third spacing fluid due to low albumin. I spoke with her son Elenore Rota at [employer]- she lives with him and he is her primary caregiver-he is open to palliative meeting. Will plan on meeting family tomorrow at [facility] (per their request).   Lane Hacker, DO Palliative Medicine [phone]

## 2014-08-09 ENCOUNTER — Inpatient Hospital Stay (HOSPITAL_COMMUNITY): Payer: Medicare Other

## 2014-08-09 DIAGNOSIS — E440 Moderate protein-calorie malnutrition: Secondary | ICD-10-CM

## 2014-08-09 DIAGNOSIS — I2510 Atherosclerotic heart disease of native coronary artery without angina pectoris: Secondary | ICD-10-CM

## 2014-08-09 LAB — URINALYSIS, ROUTINE W REFLEX MICROSCOPIC
Bilirubin Urine: NEGATIVE
GLUCOSE, UA: NEGATIVE mg/dL
Hgb urine dipstick: NEGATIVE
Ketones, ur: NEGATIVE mg/dL
Leukocytes, UA: NEGATIVE
NITRITE: NEGATIVE
PH: 7 (ref 5.0–8.0)
PROTEIN: 100 mg/dL — AB
SPECIFIC GRAVITY, URINE: 1.014 (ref 1.005–1.030)
UROBILINOGEN UA: 0.2 mg/dL (ref 0.0–1.0)

## 2014-08-09 LAB — URINE MICROSCOPIC-ADD ON

## 2014-08-09 MED ORDER — ENSURE COMPLETE PO LIQD
237.0000 mL | Freq: Two times a day (BID) | ORAL | Status: DC
  Administered 2014-08-11: 237 mL via ORAL

## 2014-08-09 MED ORDER — ENSURE PUDDING PO PUDG
1.0000 | Freq: Three times a day (TID) | ORAL | Status: DC
  Administered 2014-08-09 – 2014-08-10 (×3): 1 via ORAL

## 2014-08-09 NOTE — Progress Notes (Addendum)
[facility] Progress Note   Monica Rhodes ACZ:[id number] DOB: 1927/10/03 DOA: 08/02/2014 PCP: Elyn Peers, MD  Brief narrative: Monica Rhodes is a 81 y.o. female with HTN and A-fib presenting with dyspnea, orthopnea and severe pedal edema. I had a long discussion with her and her husband about her medications - they initially believed that she had been taking Lasix at home and this was discontinued about 1 mo ago and she was switched to [place], but after reviewing all of her medications (old and new) it appears she was previously on Triamterene/ HCTZ alone with Amlodipine (separately) which was switched to Tribenzor about 1 mo ago. The husband feels that her BP is much better controlled on Tribenzor that it had been in the past.   Subjective: Feeling about the same today as she was yesterday. Still very weak and short of breath with a short walk. Have spoken with both her sons today.   Assessment/Plan: Principal Problem:   Acute systolic congestive heart failure - Echo report reveals that she has systolic heart failure with an EF of 35% - 8000 cc negative balance-became orthostatic- has been hydrated overnight by cardiology and now euvolemic -  pulse ox 89-90% on room air - this is likely due to atelectasis and I have encouraged her ambulate as much as possible - started IS-  - cardiology managing Lasix- they have requested a palliative care consult and a meeting is set up for later today.   Active Problems: Orthostatic hypotension/ severe weakness - hydrated well and now improved  Possible tamponade/moderate pericardial effusion -Reviewed echo report today which has findings that may be consistent with tamponade on. The patient does not clinically fit the picture of cardiac tamponade -cardiology initially was recommending to repeat ECHO in 1wk- at this time Palliative care has been consulted and I am not certain of the final plan for follow up  Mildly elevated Troponin -  likely in related to CHF- no chest pain    Atrial flutter -presuming this is a new diagnosis- noted incidentally on EKG on admission - no h/o palpitations- no record of this in chart and no sign of it on old EKGs that are available in Epic - cont Coreg for rate control  -Have started Lovenox for anticoagulation- cardiology recommends to avoid anticoagulation at this time- have stopped Lovenox  Malnutrition of moderate degree -  add ensure pudding  Aspiration? Dysphagia? - SLP eval ordered- no obvious signs of aspiration- cont regular diet with thin liquids  Bradycardia HR dropped into 30s and 2.19 sec pause noted on tele- held Clonidine and Digoxin and decreased the dose of Metoprolol - heart rate is currently stable  - continuing to hold clonidine and digoxin- now on Coreg per cadiology  Acute renal failure on CKD 3 - may be due to Lasix- improved now that he is off Lasix for the past few - holding ARB- on hydralazine and Imdur for afterload reduction      Hypertension - apparently was difficult to control at home but appears to be reasonably controlled here - due to bradycardia, have had to hold Clonidine and due to renal failure, holding ARB - added Hydralazine and Imdur in place of above mentioned held medications   Code Status: full code Family Communication: husband Disposition Plan: home when stable-  DVT prophylaxis:  Lovenox  Consultants: cardiology  Procedures: none  Antibiotics: Anti-infectives    None      Objective: [employer]   08/07/14 0612 08/08/14 1093 08/09/14 2355  Weight: 50.485 kg (111 lb 4.8 oz) 50.243 kg (110 lb 12.3 oz) 50.1 kg (110 lb 7.2 oz)    Intake/Output Summary (Last 24 hours) at 08/09/14 1012 Last data filed at 08/09/14 0600  Gross per 24 hour  Intake   1214 ml  Output    850 ml  Net    364 ml     Vitals Filed Vitals:   08/08/14 1748 08/08/14 2152 08/08/14 2330 08/09/14 0549  BP: 134/48 149/48 138/64 134/58  Pulse: 61  68 68 70  Temp:  98.2 F (36.8 C) 98.6 F (37 C) 98.2 F (36.8 C)  TempSrc:  Oral Oral Oral  Resp:  18 20 22   Height:      Weight:    50.1 kg (110 lb 7.2 oz)  SpO2: 95% 97% 98% 92%    Exam: General: AAO x3, No acute respiratory distress Lungs: Clear to auscultation bilaterally without wheezes or crackles Cardiovascular: Regular rate and rhythm without murmur gallop or rub normal S1 and S2 Abdomen: Nontender, nondistended, soft, bowel sounds positive, no rebound, no ascites, no appreciable mass Extremities: No significant cyanosis, clubbing, no further pedal edema  Data Reviewed: Basic Metabolic Panel:  Recent Labs Lab 08/03/14 0010 08/04/14 0425 08/05/14 0433 08/06/14 0341 08/08/14 1320  NA 140 141 138 132* 135  K 3.5 3.4* 3.7 4.1 3.9  CL 101 99 95* 90* 95*  CO2 34* 36* 35* 32 36*  GLUCOSE 172* 121* 108* 92 227*  BUN 43* 41* 44* 48* 44*  CREATININE 1.50* 1.57* 1.77* 1.72* 1.34*  CALCIUM 8.7 8.7 8.7 8.3* 8.4   Liver Function Tests:  Recent Labs Lab 08/02/14 1402  AST 25  ALT 9  ALKPHOS 38*  BILITOT 0.4  PROT 6.0  ALBUMIN 2.9*   No results for input(s): LIPASE, AMYLASE in the last 168 hours. No results for input(s): AMMONIA in the last 168 hours. CBC:  Recent Labs Lab 08/02/14 1402 08/03/14 0010 08/08/14 1320  WBC 6.6 6.4 5.5  NEUTROABS 4.8 4.5  --   HGB 13.4 11.4* 11.5*  HCT 42.6 36.5 36.3  MCV 87.8 88.0 87.3  PLT 194 185 219   Cardiac Enzymes:  Recent Labs Lab 08/02/14 1402 08/02/14 1934 08/03/14 0010 08/03/14 0508  TROPONINI 0.04* 0.04* 0.04* 0.05*   BNP (last 3 results)  Recent Labs  08/02/14 1407  BNP 2079.5*    ProBNP (last 3 results) No results for input(s): PROBNP in the last 8760 hours.  CBG: No results for input(s): GLUCAP in the last 168 hours.  No results found for this or any previous visit (from the past 240 hour(s)).   Studies:  Recent x-ray studies have been reviewed in detail by the Attending  Physician  Scheduled Meds:  Scheduled Meds: . amLODipine  10 mg Oral Daily  . antiseptic oral rinse  7 mL Mouth Rinse BID  . aspirin EC  81 mg Oral Daily  . atorvastatin  20 mg Oral q1800  . carvedilol  6.25 mg Oral BID WC  . enoxaparin (LOVENOX) injection  30 mg Subcutaneous Daily  . feeding supplement (GLUCERNA SHAKE)  237 mL Oral TID BM  . hydrALAZINE  50 mg Oral 4 times per day  . isosorbide mononitrate  30 mg Oral Daily  . Linaclotide  145 mcg Oral Daily  . multivitamin with minerals  1 tablet Oral Daily  . sodium chloride  3 mL Intravenous Q12H   Continuous Infusions:   Time spent on care of this patient: 59  min   Monica Mehler, MD 08/09/2014, 10:12 AM  LOS: 7 days   [facility] Office  [phone] Pager - Text Page per [email or web address]  If 7PM-7AM, please contact night-coverage [email or web address]

## 2014-08-09 NOTE — Consult Note (Signed)
Palliative Medicine Team Consult Note  Monica Rhodes was by self report and confirmed by her family highly functional and independent about 3 weeks ago-this has been a rapid and dramatic change in her health. She has been inpatient for 7 days, she is "tired" and wants to go home. She tells me she could barely make it to the door and back this morning. She is dyspneic at rest and feels like this is suffering.She wants to improve and get better if possible. We discussed her prognosis and possible disease trajectories-we also discussed advance care planning. She is clearly and without question a DNR, and the conversation was brief-she has no desire for "heroics" or to ever be on machines or even brought back to the hospital unless absolutely necessary.  She wants to go home and try PT along with continued medical management. I provided education on hospice services if she declines or needs to be evaluated for eligibility. Her family understands next steps if she does not do well.   Her UA showed Nephrotic range Proteinuria. This is probably contributing to her peripheral edema and the pleural effusions which are making it so difficult for her to breath. There may be a significant palliative benefit to treating the proteinuria and doing at least a non-invasive work-up for potentially reversible causes. At minimum I would consider using an ACE-I and Loop for her BP control- her family tells me that just prior to the acute worsening of her condition Dr. Criss Rosales change her blood pressure medicine to Tribenzor- which is CCB, HCTZ combo drug- because her pressure was unusually high- she stopped her Lasix and ACE. Her PCP had also been encouraging her to take protein supplements for nutrition due to a falling total protein on routine blood work. I will discuss with primary team. Will need at least some additional information for prognostic purposes. Her 2D echo shows a moderate pericardial effusion-possible  restriction.  1. DNR 2. Consider Diagnostic work-up of large pericardial effusion, pleural effusions in the setting of nephrotic range proteinuria. Her functional status was independent 2-3 weeks ago. There may be a reversible component but difficult to know what underlying process is currently. May be too premature for Hospice-she wants to try in home PT and home health for now- she will need home O2-continues to have desaturations. I overall agree that her prognosis is likely very poor-unclear how much if any of this is reversible. Will need very close outpatient follow. 3. Education provided on hospice transition when the time is right for patient and family- they are still hoping for some level of recovery but in general they are very realistic.  Monica Hacker, DO Palliative Medicine [phone]

## 2014-08-09 NOTE — Progress Notes (Signed)
   SUBJECTIVE: The patient is "a little better" today. SOB appears stable.   At this time, she denies chest pain or any new concerns.  Marland Kitchen amLODipine  10 mg Oral Daily  . antiseptic oral rinse  7 mL Mouth Rinse BID  . aspirin EC  81 mg Oral Daily  . atorvastatin  20 mg Oral q1800  . carvedilol  6.25 mg Oral BID WC  . enoxaparin (LOVENOX) injection  30 mg Subcutaneous Daily  . feeding supplement (ENSURE)  1 Container Oral TID BM  . feeding supplement (GLUCERNA SHAKE)  237 mL Oral TID BM  . hydrALAZINE  50 mg Oral 4 times per day  . isosorbide mononitrate  30 mg Oral Daily  . Linaclotide  145 mcg Oral Daily  . multivitamin with minerals  1 tablet Oral Daily  . sodium chloride  3 mL Intravenous Q12H      OBJECTIVE: Physical Exam: Filed Vitals:   08/08/14 2152 08/08/14 2330 08/09/14 0549 08/09/14 1017  BP: 149/48 138/64 134/58 125/53  Pulse: 68 68 70   Temp: 98.2 F (36.8 C) 98.6 F (37 C) 98.2 F (36.8 C)   TempSrc: Oral Oral Oral   Resp: 18 20 22    Height:      Weight:   110 lb 7.2 oz (50.1 kg)   SpO2: 97% 98% 92%     Intake/Output Summary (Last 24 hours) at 08/09/14 1109 Last data filed at 08/09/14 0830  Gross per 24 hour  Intake    857 ml  Output   1150 ml  Net   -293 ml    Telemetry reveals afib  GEN- The patient is frail and chronically ill appearing, alert and oriented x 3 today.   Head- normocephalic, atraumatic Eyes-  Sclera clear, conjunctiva pink Ears- hearing intact Oropharynx- clear with dry MM Neck- supple  Lungs- Clear to ausculation bilaterally, normal work of breathing, wearing O2 Heart- irregular rate and rhythm  GI- soft, NT, ND, + BS Extremities- no clubbing, cyanosis, trace edema Skin- no rash or lesion Psych- euthymic mood, full affect Neuro- strength and sensation are intact  LABS: Basic Metabolic Panel:  Recent Labs  08/08/14 1320  NA 135  K 3.9  CL 95*  CO2 36*  GLUCOSE 227*  BUN 44*  CREATININE 1.34*  CALCIUM 8.4     ASSESSMENT AND PLAN:  1. Acute systolic heart failure - Echo 08/03/2014 EF 35%, mild MR, moderate pericardial effusion, +PFO   - fluid management has been difficult due to her fragility  - would keep Is and Os about even at this time  2. HTN Stable No change required today  3. Aflutter, rate controlled - not a candidate for anticoagulation  4. Pericardial effusion - as it has been a week since last echo, will repeat echo here tomorrow  5. H/o MI in 2012, unable to gain access via R radial or both femoral arteries despite multiple attempt, medical management  Her prognosis is very poor.  I believe that she is about as tuned as we can get her from a CV standpoint. Palliative care meeting at 2pm noted for today.  Thompson Grayer, MD 08/09/2014 11:09 AM

## 2014-08-09 NOTE — Progress Notes (Signed)
Patient is doing okay tonight.  No signs of distress noted.  O2 level good with 1.5L of [place].  Son in the room with her and other family came to visit earlier this evening.  Emptied the colostomy bag and re-situated patient to be more comfortable.

## 2014-08-10 ENCOUNTER — Telehealth: Payer: Self-pay

## 2014-08-10 ENCOUNTER — Inpatient Hospital Stay (HOSPITAL_COMMUNITY): Payer: Medicare Other

## 2014-08-10 DIAGNOSIS — I319 Disease of pericardium, unspecified: Secondary | ICD-10-CM

## 2014-08-10 LAB — PROTEIN / CREATININE RATIO, URINE
Creatinine, Urine: 15.66 mg/dL
Protein Creatinine Ratio: 1.98 — ABNORMAL HIGH (ref 0.00–0.15)
Total Protein, Urine: 31 mg/dL

## 2014-08-10 MED ORDER — HYDRALAZINE HCL 25 MG PO TABS
25.0000 mg | ORAL_TABLET | Freq: Three times a day (TID) | ORAL | Status: DC
  Administered 2014-08-10 – 2014-08-11 (×3): 25 mg via ORAL
  Filled 2014-08-10 (×5): qty 1

## 2014-08-10 MED ORDER — FUROSEMIDE 10 MG/ML IJ SOLN
40.0000 mg | Freq: Once | INTRAMUSCULAR | Status: AC
  Administered 2014-08-10: 40 mg via INTRAVENOUS
  Filled 2014-08-10: qty 4

## 2014-08-10 MED ORDER — ALBUTEROL SULFATE (2.5 MG/3ML) 0.083% IN NEBU
2.5000 mg | INHALATION_SOLUTION | RESPIRATORY_TRACT | Status: DC | PRN
  Administered 2014-08-10 (×2): 2.5 mg via RESPIRATORY_TRACT
  Filled 2014-08-10 (×2): qty 3

## 2014-08-10 MED ORDER — GLUCERNA SHAKE PO LIQD: 237.0000 mL | ORAL | Status: DC

## 2014-08-10 NOTE — Telephone Encounter (Signed)
-----   Message from Candis Schatz sent at 08/07/2014  1:09 PM EST ----- TOC, CHF dc [facility] 08/07/14

## 2014-08-10 NOTE — Progress Notes (Signed)
FOLLOW-UP NUTRITION ASSESSMENT  DOCUMENTATION CODES Per approved criteria  -Non-severe (moderate) malnutrition in the context of chronic illness   INTERVENTION: Continue Ensure Enlive BID after meals, each supplement provides 350 kcal and 20 grams of protein Continue Glucerna Shakes once daily HS, each supplement provides 220 kcal and 10 grams of protein Continue Multivitamin with minerals daily Encourage PO intake  NUTRITION DIAGNOSIS: Malnutrition related to chronic illness and poor PO intake as evidenced by moderate to severe wasting; ongoing  Goal: Pt to meet >/= 90% of their estimated nutrition needs; being met  Monitor:  PO intake, supplement acceptance, I/O's weight trend, labs  ASSESSMENT: 81 y.o. Female with history of diabetes and COPD, comes in with increasing SOB, orthopnea, and bilateral swelling in her feet.   Pt reports ongoing poor appetite and poor PO intake. Per nursing notes pt is eating 20-50% of most meals. Pt reports eating less than half of most meals but, she has been drinking a Glucerna Shake after each meal which she enjoys and would like to continue. Weight has varied this past week, currently down 1 lb from a week ago.   Labs: low GFR, elevated BUN; Glucose ranging 92 to 227 mg/dL -5.8 L fluid balance since admission  Height: Ht Readings from Last 1 Encounters:  08/02/14 $RemoveB'5\' 1"'[id number]$  (1.549 m)    Weight: Wt Readings from Last 1 Encounters:  08/10/14 114 lb 10.2 oz (52 kg)  08/09/14 110 lb 08/06/14 107 lb 08/03/14 115 lb  BMI:  Body mass index is 21.67 kg/(m^2).  Estimated Nutritional Needs: Kcal: 1500-1700 Protein: 75-85 grams Fluid: 1.5-1.7 L/day  Skin: stage II pressure ulcer on sacrum; +1 RLE and LLE edema  Diet Order: Diet Heart   Intake/Output Summary (Last 24 hours) at 08/10/14 1603 Last data filed at 08/10/14 1301  Gross per 24 hour  Intake    703 ml  Output   1200 ml  Net   -497 ml    Last BM: 2/28  Labs:   Recent  Labs Lab 08/05/14 0433 08/06/14 0341 08/08/14 1320  NA 138 132* 135  K 3.7 4.1 3.9  CL 95* 90* 95*  CO2 35* 32 36*  BUN 44* 48* 44*  CREATININE 1.77* 1.72* 1.34*  CALCIUM 8.7 8.3* 8.4  GLUCOSE 108* 92 227*    CBG (last 3)  No results for input(s): GLUCAP in the last 72 hours.  Scheduled Meds: . amLODipine  10 mg Oral Daily  . antiseptic oral rinse  7 mL Mouth Rinse BID  . aspirin EC  81 mg Oral Daily  . atorvastatin  20 mg Oral q1800  . carvedilol  6.25 mg Oral BID WC  . enoxaparin (LOVENOX) injection  30 mg Subcutaneous Daily  . feeding supplement (ENSURE COMPLETE)  237 mL Oral BID BM  . feeding supplement (ENSURE)  1 Container Oral TID BM  . feeding supplement (GLUCERNA SHAKE)  237 mL Oral TID BM  . hydrALAZINE  25 mg Oral TID  . isosorbide mononitrate  30 mg Oral Daily  . Linaclotide  145 mcg Oral Daily  . multivitamin with minerals  1 tablet Oral Daily  . sodium chloride  3 mL Intravenous Q12H    Continuous Infusions:  Pryor Ochoa RD, LDN Inpatient Clinical Dietitian Pager: [phone] After Hours Pager: [phone]

## 2014-08-10 NOTE — Progress Notes (Signed)
Utilization review complete. Tarra Pence RN CCM Case Mgmt phone [phone] 

## 2014-08-10 NOTE — Progress Notes (Signed)
  Echocardiogram 2D Echocardiogram has been performed.  Monica Rhodes 08/10/2014, 1:05 PM

## 2014-08-10 NOTE — Progress Notes (Signed)
Paged callahan about patient not being able to breath well.. O2 sats on 3L [place] is 92-93%.  Giving 2nd albuterol treatment since 0200.  Her sats while the treatment is going is 95%. Will await any new orders and continue to monitor.

## 2014-08-10 NOTE — Progress Notes (Signed)
Patient Name: Monica Rhodes Date of Encounter: 08/10/2014  Primary Cardiologist: Dr. Ron Parker   Principal Problem:   Acute systolic CHF (congestive heart failure) Active Problems:   History of colectomy   Hypertension   CAD (coronary artery disease)   Malnutrition of moderate degree   Atrial flutter   Pericardial effusion   Congestive heart disease    SUBJECTIVE  Denies any CP. Significant SOB this morning. Bottom sore, family request donut cushion  CURRENT MEDS . amLODipine  10 mg Oral Daily  . antiseptic oral rinse  7 mL Mouth Rinse BID  . aspirin EC  81 mg Oral Daily  . atorvastatin  20 mg Oral q1800  . carvedilol  6.25 mg Oral BID WC  . enoxaparin (LOVENOX) injection  30 mg Subcutaneous Daily  . feeding supplement (ENSURE COMPLETE)  237 mL Oral BID BM  . feeding supplement (ENSURE)  1 Container Oral TID BM  . feeding supplement (GLUCERNA SHAKE)  237 mL Oral TID BM  . furosemide  40 mg Intravenous Once  . hydrALAZINE  50 mg Oral 4 times per day  . isosorbide mononitrate  30 mg Oral Daily  . Linaclotide  145 mcg Oral Daily  . multivitamin with minerals  1 tablet Oral Daily  . sodium chloride  3 mL Intravenous Q12H    OBJECTIVE  Filed Vitals:   08/10/14 0256 08/10/14 0411 08/10/14 0652 08/10/14 0857  BP:  122/57  152/72  Pulse:  81  64  Temp:  97.9 F (36.6 C)  97.6 F (36.4 C)  TempSrc:  Oral  Oral  Resp:  19 21 24   Height:      Weight:  114 lb 10.2 oz (52 kg)    SpO2: 95% 91% 94% 96%    Intake/Output Summary (Last 24 hours) at 08/10/14 0919 Last data filed at 08/10/14 0731  Gross per 24 hour  Intake    960 ml  Output    600 ml  Net    360 ml   Filed Weights   08/08/14 0632 08/09/14 0549 08/10/14 0411  Weight: 110 lb 12.3 oz (50.243 kg) 110 lb 7.2 oz (50.1 kg) 114 lb 10.2 oz (52 kg)    PHYSICAL EXAM  General: Pleasant. Mild distress Neuro: Alert and oriented X 3. Moves all extremities spontaneously. Psych: Normal affect. HEENT:  Normal  Neck:  Supple without bruits or JVD. Lungs:  Resp regular and unlabored. Decreased breath sound bilaterally, tachypneic.   Heart: irregular. no s3, s4, or murmurs. Abdomen: Soft, non-distended, BS + x 4. Colostomy bad present, no sign of infection. Extremities: No clubbing, cyanosis or edema. Wound dressing present  Accessory Clinical Findings  CBC  Recent Labs  08/08/14 1320  WBC 5.5  HGB 11.5*  HCT 36.3  MCV 87.3  PLT 366   Basic Metabolic Panel  Recent Labs  08/08/14 1320  NA 135  K 3.9  CL 95*  CO2 36*  GLUCOSE 227*  BUN 44*  CREATININE 1.34*  CALCIUM 8.4    TELE Off telemetry    ECG  No new EKG  Echocardiogram 08/03/2014  - Left ventricle: Diffuse hypokinesis with inferior wall worse. The cavity size was severely dilated. Wall thickness was normal. The estimated ejection fraction was 35%. - Mitral valve: There was mild regurgitation. - Left atrium: The appendage was moderately to severely dilated. - Right ventricle: Moderate circumferential effusion . No respirometer or phased doppler done. Howerve there dose appear to be an underfilled RV with diastolic  collapse and dilated IVC with blunted contraction with inspiration. Suggestive of tamponade physiology Clinical correlation suggested. - Atrial septum: There was a patent foramen ovale. - Pericardium, extracardiac: Moderate circumferential effusion see above. There was a left pleural effusion.     Radiology/Studies  Dg Chest 2 View  08/04/2014   CLINICAL DATA:  81 year old female with a history of pulmonary edema and weakness.  EXAM: CHEST - 2 VIEW  COMPARISON:  08/02/2014, CT abdomen 04/16/2012, plain film 04/17/2011 and 04/14/2011  FINDINGS: Cardiomediastinal silhouette unchanged in size and contour. Cardiomegaly persists.  Atherosclerotic calcifications of the aortic arch.  Dense opacities at the bilateral lung bases with obscuration of the hemidiaphragms.  Interlobular septal thickening.   Diffuse osteopenia. Degenerative changes of the bilateral shoulders.  IMPRESSION: Persisting pulmonary edema and bibasilar pleural effusions with associated atelectasis.  Cardiomegaly.  Atherosclerosis  Signed,  Dulcy Fanny. Earleen Newport, DO  [facility]  [facility]   Electronically Signed   By: Corrie Mckusick D.O.   On: 08/04/2014 09:12   Dg Chest 2 View  08/02/2014   CLINICAL DATA:  High blood pressure and shortness of breath for 2 days.  EXAM: CHEST  2 VIEW  COMPARISON:  04/17/2011  FINDINGS: The heart is enlarged. There is tortuosity, ectasia and calcification of the thoracic aorta. There are bilateral pleural effusions, right greater than left and interstitial pulmonary edema. The bony thorax is intact  IMPRESSION: CHF with bilateral effusions, right greater than left.   Electronically Signed   By: Marijo Sanes M.D.   On: 08/02/2014 15:14   Dg Chest Port 1 View  08/10/2014   CLINICAL DATA:  Dyspnea.  Pleural effusions and pulmonary edema.  EXAM: PORTABLE CHEST - 1 VIEW  COMPARISON:  08/09/2014 and 08/04/2014  FINDINGS: There is persistent cardiomegaly with bilateral pleural effusions, slightly increased on the right. Pulmonary vascularity is normal. Slight residual haziness in the left perihilar region consistent with pulmonary edema.  No acute osseous abnormalities.  IMPRESSION: 1. Slight residual left perihilar pulmonary edema. 2. Persistent bilateral effusions, slightly increased on the right.   Electronically Signed   By: Lorriane Shire M.D.   On: 08/10/2014 07:16   Dg Chest Port 1 View  08/09/2014   CLINICAL DATA:  Dyspnea.  Shortness of breath today.  EXAM: PORTABLE CHEST - 1 VIEW  COMPARISON:  Two-view chest x-ray 08/04/2014  FINDINGS: The heart is enlarged. There straightening of the AP window compared to prior studies. This raises the possibility of pericardial effusion. Bilateral pleural effusions are similar to the prior study. Mild edema is unchanged.  Bibasilar airspace disease likely reflects atelectasis. Degenerative changes are noted at the shoulders bilaterally.  IMPRESSION: 1. Cardiomegaly with straightening of the AP window air raising concern for a pericardial effusion. 2. Persistent edema and bilateral effusions compatible with congestive heart failure. 3. Bibasilar airspace disease likely reflects atelectasis.   Electronically Signed   By: San Morelle M.D.   On: 08/09/2014 16:08    ASSESSMENT AND PLAN  1. Acute systolic heart failure  - Echo 08/03/2014 EF 35%, mild MR, moderate pericardial effusion, +PFO  - lasix stopped this AM as she had severe weakness, given 250 cc bolus of fluid  - difficult to manage her fluid level, had some over diuresis on 2/25, given soft fluid hydration overnight with some improvement. IVF discontinued on 2/26.   - some respiratory distress this morning, CXR concerning for pulm edema and pleural effusion, will give 1 dose of 40mg  IV  lasix and monitor. Will also obtain repeat echo today to assess degree of pericardial effusion. May need to hold coreg until HF issue resolve.  - overall poor prognosis, seen by palliative care team yesterday.   2. HTN  3. Aflutter, rate controlled  - CHA2DS2-Vasc 6 (age, HTN, DM, HF, female)  - no anticoagulation at this time because of pericardial effusion  4. Pericardial effusion  - Echo 08/03/2014 EF 35%, moderate circumferential effusion, does appear to be an underfilled RV with diastolic collapse and dilated IVC with blunted contraction with inspiration  - pending repeat echo today to assess pericardial effusion.  5. H/o MI in 2012, unable to gain access via R radial or both femoral arteries despite multiple attempt, medical management  6. Significant orthostatic hypotension: SBP dropped from 146 to 100 upon standing  - will need to allow for higher BP when lying down to avoid significant drop in BP upon standing  Signed, Almyra Deforest PA-C Pager:  [phone] As above, patient seen and examined. Patient describes dyspnea this morning but no chest pain. She complains of sacral pain. Chest x-ray shows bilateral pleural effusions. We need to resume Lasix. She did have problems with orthostasis previously. Change hydralazine to 25 mg by mouth 3 times a day to allow more blood pressure. Repeat echocardiogram to reassess pericardial effusion. Overall prognosis appears to be poor. Kirk Ruths

## 2014-08-10 NOTE — Progress Notes (Addendum)
Patient feeling short of breath. Oxygen was 91% on 2L. Bumped up to 3L and it went to 93-94%   Will notify MD and ask for a PRN breathing treatment for patient for SOB   Callahan ordered PRN albuterol. Administering the treatment and will place back on 3L when finished and continue to monitor.

## 2014-08-10 NOTE — Progress Notes (Signed)
Medicare Important Message given? YES  Date Medicare IM given:  08/10/2014 Medicare IM given by: Landrie Beale  

## 2014-08-10 NOTE — Progress Notes (Deleted)
Cardiology Office Note   Date:  08/10/2014   ID:  Rhyder Bratz, DOB 07-08-1927, MRN [id number]  PCP:  Elyn Peers, MD  Cardiologist:  ***  Electrophysiologist:  ***  No chief complaint on file.    History of Present Illness: Monica Rhodes is a 81 y.o. female with a hx of ***   Studies/Reports Reviewed Today:  Echocardiogram 08/10/14  Past Medical History  Diagnosis Date  . Hypertension   . UTI (urinary tract infection)   . SBO (small bowel obstruction)     Recurrent, prolonged hospitalization November, 2012  . Protein calorie malnutrition   . Hyperlipidemia   . DM (diabetes mellitus)   . COPD (chronic obstructive pulmonary disease)   . PVD (peripheral vascular disease)   . Colon cancer   . Bradycardia   . Tobacco abuse   . CAD (coronary artery disease)     Non-STEMI November, 2012,, Cardiac catheterization was attempted, despite significant efforts catheters could not be progressed to the coronary arteries ., Therefore medical therapy  . Carotid artery disease     Doppler  April 16, 2011,, total occlusion LICA, 32-35% R. ICA  . PAD (peripheral artery disease)     Suggestions significant stenosis distal aorta by CT angiogram  November, 2012  . Ejection fraction     EF 60%, echo, November, 2012,  . Mitral regurgitation     Moderate, echo, November, 2012  . Right ventricular dysfunction     Moderate, echo, November, 2012  . Edema     December, 2012  . Rash     Rash  probably related to hydralazine  December, 2012    Past Surgical History  Procedure Laterality Date  . Colostomy    . Abdominoperineal proctocolectomy    . Left heart catheterization with coronary angiogram N/A 04/24/2011    Procedure: LEFT HEART CATHETERIZATION WITH CORONARY ANGIOGRAM;  Surgeon: Josue Hector, MD;  Location: [facility] CATH LAB;  Service: Cardiovascular;  Laterality: N/A;     No current facility-administered medications for this visit.   No current outpatient prescriptions  on file.   Facility-Administered Medications Ordered in Other Visits  Medication Dose Route Frequency Provider Last Rate Last Dose  . 0.9 %  sodium chloride infusion  250 mL Intravenous PRN Geradine Girt, DO      . acetaminophen (TYLENOL) tablet 650 mg  650 mg Oral Q4H PRN Geradine Girt, DO      . albuterol (PROVENTIL) (2.5 MG/3ML) 0.083% nebulizer solution 2.5 mg  2.5 mg Nebulization Q4H PRN Dianne Dun, NP   2.5 mg at 08/10/14 5732  . amLODipine (NORVASC) tablet 10 mg  10 mg Oral Daily Debbe Odea, MD   10 mg at 08/10/14 1029  . antiseptic oral rinse (CPC / CETYLPYRIDINIUM CHLORIDE 0.05%) solution 7 mL  7 mL Mouth Rinse BID Debbe Odea, MD   7 mL at 08/10/14 2200  . aspirin EC tablet 81 mg  81 mg Oral Daily Lelon Perla, MD   81 mg at 08/10/14 1030  . atorvastatin (LIPITOR) tablet 20 mg  20 mg Oral q1800 Lelon Perla, MD   20 mg at 08/10/14 1726  . carvedilol (COREG) tablet 6.25 mg  6.25 mg Oral BID WC Lelon Perla, MD   6.25 mg at 08/10/14 1726  . enoxaparin (LOVENOX) injection 30 mg  30 mg Subcutaneous Daily Crystal Swartz, RPH   30 mg at 08/10/14 1029  . feeding supplement (ENSURE COMPLETE) (ENSURE COMPLETE) liquid  237 mL  237 mL Oral BID BM Baird Lyons, RD   237 mL at 08/10/14 1000  . [START ON 08/11/2014] feeding supplement (GLUCERNA SHAKE) (GLUCERNA SHAKE) liquid 237 mL  237 mL Oral Q24H Reanne [place] Pink, RD      . hydrALAZINE (APRESOLINE) tablet 25 mg  25 mg Oral TID Lelon Perla, MD   25 mg at 08/10/14 2124  . isosorbide mononitrate (IMDUR) 24 hr tablet 30 mg  30 mg Oral Daily Debbe Odea, MD   30 mg at 08/10/14 1030  . Linaclotide (LINZESS) capsule 145 mcg  145 mcg Oral Daily Geradine Girt, DO   145 mcg at 08/10/14 1030  . magnesium hydroxide (MILK OF MAGNESIA) suspension 30 mL  30 mL Oral Daily PRN Debbe Odea, MD   30 mL at 08/03/14 2207  . multivitamin with minerals tablet 1 tablet  1 tablet Oral Daily Baird Lyons, RD   1  tablet at 08/10/14 1030  . ondansetron (ZOFRAN) injection 4 mg  4 mg Intravenous Q6H PRN Geradine Girt, DO   4 mg at 08/04/14 0355  . sodium chloride 0.9 % injection 3 mL  3 mL Intravenous Q12H Geradine Girt, DO   3 mL at 08/10/14 2124  . sodium chloride 0.9 % injection 3 mL  3 mL Intravenous PRN Geradine Girt, DO        Allergies:   Promethazine    Social History:  The patient  reports that she has been smoking Cigarettes.  She has been smoking about 0.50 packs per day. She does not have any smokeless tobacco history on file. She reports that she does not drink alcohol or use illicit drugs.   Family History:  The patient's ***family history includes Coronary artery disease in an other family member.    ROS:   Please see the history of present illness.   ROS    PHYSICAL EXAM: VS:  There were no vitals taken for this visit.    Wt Readings from Last 3 Encounters:  08/10/14 114 lb 10.2 oz (52 kg)  04/18/12 124 lb 4.8 oz (56.382 kg)  11/28/11 119 lb 12.8 oz (54.341 kg)     GEN: Well nourished, well developed, in no acute distress HEENT: normal Neck: *** JVD, ***carotid bruits, no masses Cardiac:  Normal S1/S2, ***RRR; *** murmur ***, *** no rubs or gallops, {NUMBERS; 1+ TO 4+, TRACE/RARE:14493} edema  Respiratory:  ***clear to auscultation bilaterally, no wheezing, rhonchi or rales. GI: ***soft, nontender, nondistended, + BS MS: no deformity or atrophy Skin: warm and dry  Neuro:  CNs II-XII intact, Strength and sensation are intact Psych: Normal affect   EKG:  EKG {ACTION; IS/IS BPZ:02585277} ordered today.  It demonstrates:   ***   Recent Labs: 08/02/2014: ALT 9; B Natriuretic Peptide 2079.5* 08/08/2014: BUN 44*; Creatinine 1.34*; Hemoglobin 11.5*; Platelets 219; Potassium 3.9; Sodium 135    Lipid Panel    Component Value Date/Time   CHOL 184 04/17/2011 0538   TRIG 216* 04/17/2011 0538      ASSESSMENT AND PLAN:  1.  ***   Current medicines are reviewed at  length with the patient today.  The patient {ACTIONS; HAS/DOES NOT HAVE:19233} concerns regarding medicines.  The following changes have been made:  {PLAN; NO CHANGE:13088:s}  Labs/ tests ordered today include: *** No orders of the defined types were placed in this encounter.     Disposition:   FU with *** in {gen number 8-24:235361} {TIME;  UNITS DAY/WEEK/MONTH:19136}   Signed, Versie Starks, MHS 08/10/2014 10:23 PM    [facility] [address], [place], [place]  [address] Phone: [phone]; Fax: [phone]

## 2014-08-10 NOTE — Progress Notes (Signed)
[facility] Progress Note   Monica Rhodes VOZ:[id number] DOB: Jan 27, 1928 DOA: 08/02/2014 PCP: Elyn Peers, MD  Brief narrative: Monica Rhodes is a 81 y.o. female with HTN and A-fib presenting with dyspnea, orthopnea and severe pedal edema. I had a long discussion with her and her son about her medications - they initially believed that she had been taking Lasix at home and this was discontinued about 1 mo ago and she was switched to [place], but after reviewing all of her medications (old and new) it appears she was previously on Triamterene/ HCTZ alone with Amlodipine (separately) which was switched to Tribenzor about 2 wks ago. The son feels that her BP is much better controlled on Tribenzor than it had been in the past.   Subjective: Short of breath this AM  Assessment/Plan: Principal Problem:   Acute systolic congestive heart failure - Echo report reveals that she has systolic heart failure with an EF of 35% - cardiology managing Lasix-- became over diuresed and orthostatic and therefore was hydrated -despite being orthostatic, pleural effusions never resolved- cardiology has resumed lasix today  Active Problems: Orthostatic hypotension/ severe weakness - hydrated well and now improved  Possible tamponade/moderate pericardial effusion -Reviewed echo report today which has findings that may be consistent with tamponade on. The patient ddid not clinically fit the picture of cardiac tamponade -cardiology initially was recommending to repeat ECHO in 1wk-  - They are repeating the ECHO today   Mildly elevated Troponin - likely in related to CHF- no chest pain    Atrial flutter -presuming this is a new diagnosis- noted incidentally on EKG on admission - no h/o palpitations- no record of this in chart and no sign of it on old EKGs that are available in Epic - cont Coreg for rate control - to be managed by cardiology -Have started Lovenox for anticoagulation- cardiology  recommends to avoid anticoagulation at this time- have stopped Lovenox  Malnutrition of moderate degree -  add ensure pudding  Aspiration? Dysphagia? - SLP eval ordered- no obvious signs of aspiration- cont regular diet with thin liquids  Bradycardia HR dropped into 30s and 2.19 sec pause noted on tele- held Clonidine and Digoxin and decreased the dose of Metoprolol - heart rate is currently stable  - continuing to hold clonidine and digoxin- now on Coreg per cadiology  Acute renal failure on CKD 3 - may be due to Lasix- improved when off Lasix  - holding ARB- on hydralazine and Imdur for afterload reduction - palliative care requested further w/u of proteinuria- have ordered U Pro/Cr ratio stat to assess degree of proteinuria- patient had chronic HTN which is in the differential diagnosis      Hypertension - apparently was difficult to control at home but appears to be reasonably controlled here - due to bradycardia, have had to hold Clonidine and due to renal failure, holding ARB - added Hydralazine and Imdur in place of above mentioned held medications   Code Status: full code Family Communication: husband Disposition Plan: home when stable-  DVT prophylaxis:  Lovenox  Consultants: cardiology  Procedures: none  Antibiotics: Anti-infectives    None      Objective: Filed Weights   08/08/14 0632 08/09/14 0549 08/10/14 0411  Weight: 50.243 kg (110 lb 12.3 oz) 50.1 kg (110 lb 7.2 oz) 52 kg (114 lb 10.2 oz)    Intake/Output Summary (Last 24 hours) at 08/10/14 0942 Last data filed at 08/10/14 0731  Gross per 24 hour  Intake  960 ml  Output    600 ml  Net    360 ml     Vitals Filed Vitals:   08/10/14 0256 08/10/14 0411 08/10/14 0652 08/10/14 0857  BP:  122/57  152/72  Pulse:  81  64  Temp:  97.9 F (36.6 C)  97.6 F (36.4 C)  TempSrc:  Oral  Oral  Resp:  19 21 24   Height:      Weight:  52 kg (114 lb 10.2 oz)    SpO2: 95% 91% 94% 96%     Exam: General: AAO x3, No acute respiratory distress Lungs: Clear to auscultation bilaterally without wheezes or crackles Cardiovascular: Regular rate and rhythm without murmur gallop or rub normal S1 and S2 Abdomen: Nontender, nondistended, soft, bowel sounds positive, no rebound, no ascites, no appreciable mass Extremities: No significant cyanosis, clubbing, no further pedal edema  Data Reviewed: Basic Metabolic Panel:  Recent Labs Lab 08/04/14 0425 08/05/14 0433 08/06/14 0341 08/08/14 1320  NA 141 138 132* 135  K 3.4* 3.7 4.1 3.9  CL 99 95* 90* 95*  CO2 36* 35* 32 36*  GLUCOSE 121* 108* 92 227*  BUN 41* 44* 48* 44*  CREATININE 1.57* 1.77* 1.72* 1.34*  CALCIUM 8.7 8.7 8.3* 8.4   Liver Function Tests: No results for input(s): AST, ALT, ALKPHOS, BILITOT, PROT, ALBUMIN in the last 168 hours. No results for input(s): LIPASE, AMYLASE in the last 168 hours. No results for input(s): AMMONIA in the last 168 hours. CBC:  Recent Labs Lab 08/08/14 1320  WBC 5.5  HGB 11.5*  HCT 36.3  MCV 87.3  PLT 219   Cardiac Enzymes: No results for input(s): CKTOTAL, CKMB, CKMBINDEX, TROPONINI in the last 168 hours. BNP (last 3 results)  Recent Labs  08/02/14 1407  BNP 2079.5*    ProBNP (last 3 results) No results for input(s): PROBNP in the last 8760 hours.  CBG: No results for input(s): GLUCAP in the last 168 hours.  No results found for this or any previous visit (from the past 240 hour(s)).   Studies:  Recent x-ray studies have been reviewed in detail by the Attending Physician  Scheduled Meds:  Scheduled Meds: . amLODipine  10 mg Oral Daily  . antiseptic oral rinse  7 mL Mouth Rinse BID  . aspirin EC  81 mg Oral Daily  . atorvastatin  20 mg Oral q1800  . carvedilol  6.25 mg Oral BID WC  . enoxaparin (LOVENOX) injection  30 mg Subcutaneous Daily  . feeding supplement (ENSURE COMPLETE)  237 mL Oral BID BM  . feeding supplement (ENSURE)  1 Container Oral TID BM   . feeding supplement (GLUCERNA SHAKE)  237 mL Oral TID BM  . furosemide  40 mg Intravenous Once  . hydrALAZINE  50 mg Oral 4 times per day  . isosorbide mononitrate  30 mg Oral Daily  . Linaclotide  145 mcg Oral Daily  . multivitamin with minerals  1 tablet Oral Daily  . sodium chloride  3 mL Intravenous Q12H   Continuous Infusions:   Time spent on care of this patient: 35 min   [place], MD 08/10/2014, 9:42 AM  LOS: 8 days   [facility] Office  [phone] Pager - Text Page per [email or web address]  If 7PM-7AM, please contact night-coverage [email or web address]

## 2014-08-10 NOTE — Progress Notes (Signed)
Physical Therapy Treatment Patient Details Name: Monica Rhodes MRN: [id number] DOB: 05/07/1928 Today's Date: 02-Sep-2014    History of Present Illness 81 YO FEMALE ADMITTED 08/02/14 with  increased SOB, bilateral leg swelling, acute systolic CHF, EF 56%, h/o colostomy, afib.    PT Comments    Patient with DOE.  Able to move well - limited by fatigue/dyspnea.  Follow Up Recommendations  Home health PT;Supervision/Assistance - 24 hour     Equipment Recommendations  None recommended by PT    Recommendations for Other Services       Precautions / Restrictions Precautions Precautions: Fall Restrictions Weight Bearing Restrictions: No    Mobility  Bed Mobility Overal bed mobility: Needs Assistance Bed Mobility: Supine to Sit;Sit to Supine     Supine to sit: Supervision;HOB elevated Sit to supine: Min assist;HOB elevated   General bed mobility comments: min assist to lift legs back into bed.  Patient sat EOB x 2 minutes and c/o pain on buttocks with sitting.  Moved to standing.  Transfers Overall transfer level: Needs assistance Equipment used: 1 person hand held assist Transfers: Sit to/from Stand Sit to Stand: Min guard         General transfer comment: Patient stood EOB x 3 minutes.  Noted increase in dyspnea with activity.  Fatigued quickly.  Returned to supine.  Ambulation/Gait             General Gait Details: Declined due to fatigue   Stairs            Wheelchair Mobility    Modified Rankin (Stroke Patients Only)       Balance           Standing balance support: Single extremity supported Standing balance-Leahy Scale: Fair                      Cognition Arousal/Alertness: Awake/alert Behavior During Therapy: WFL for tasks assessed/performed Overall Cognitive Status: Within Functional Limits for tasks assessed                      Exercises      General Comments General comments (skin integrity, edema, etc.):  Buttocks sore.  Son reports nursing is getting her a "donut" for sitting.      Pertinent Vitals/Pain Pain Assessment: 0-10 Pain Score: 5  Pain Location: buttocks Pain Descriptors / Indicators: Sore Pain Intervention(s): Limited activity within patient's tolerance;Repositioned    Home Living                      Prior Function            PT Goals (current goals can now be found in the care plan section) Progress towards PT goals: Progressing toward goals    Frequency  Min 3X/week    PT Plan Current plan remains appropriate    Co-evaluation             End of Session   Activity Tolerance: Patient limited by fatigue;Patient limited by pain Patient left: in bed;with call bell/phone within reach     Time: 1122-1135 PT Time Calculation (min) (ACUTE ONLY): 13 min  Charges:  $Therapeutic Activity: 8-22 mins                    G Codes:      Despina Pole 2014/09/02, 11:45 AM Carita Pian. Sanjuana Kava, [place] Pager [phone]

## 2014-08-10 NOTE — Progress Notes (Signed)
Urine protein: Creatinine ratio 1.98- not consistent with nephrotic range proteinuria

## 2014-08-10 NOTE — Progress Notes (Signed)
Patient is complaining of SOB and feeling being lightheaded. She also stated that she feels like she is going to pass out. BP is 152/72; temp is 97.6; HR is 64; Resp is 24; and O2 on 4L is 96%. Attending MD paged and Cardiology PA notified and made aware. New orders given by PA. Will continue to monitor patient for further changes in condition.

## 2014-08-11 ENCOUNTER — Encounter: Payer: Medicare Other | Admitting: Physician Assistant

## 2014-08-11 LAB — COMPREHENSIVE METABOLIC PANEL
ALBUMIN: 2.7 g/dL — AB (ref 3.5–5.2)
ALK PHOS: 34 U/L — AB (ref 39–117)
ALT: 15 U/L (ref 0–35)
AST: 46 U/L — ABNORMAL HIGH (ref 0–37)
Anion gap: 5 (ref 5–15)
BILIRUBIN TOTAL: 0.6 mg/dL (ref 0.3–1.2)
BUN: 37 mg/dL — AB (ref 6–23)
CHLORIDE: 96 mmol/L (ref 96–112)
CO2: 37 mmol/L — ABNORMAL HIGH (ref 19–32)
Calcium: 8.5 mg/dL (ref 8.4–10.5)
Creatinine, Ser: 1.2 mg/dL — ABNORMAL HIGH (ref 0.50–1.10)
GFR calc non Af Amer: 40 mL/min — ABNORMAL LOW (ref >90)
GFR, EST AFRICAN AMERICAN: 47 mL/min — AB (ref >90)
GLUCOSE: 106 mg/dL — AB (ref 70–99)
POTASSIUM: 4.3 mmol/L (ref 3.5–5.1)
Sodium: 138 mmol/L (ref 135–145)
Total Protein: 5.4 g/dL — ABNORMAL LOW (ref 6.0–8.3)

## 2014-08-11 LAB — PROTEIN ELECTROPH W RFLX QUANT IMMUNOGLOBULINS
ALPHA-1-GLOBULIN: 6.5 % — AB (ref 2.9–4.9)
Albumin ELP: 52.3 % — ABNORMAL LOW (ref 55.8–66.1)
Alpha-2-Globulin: 13.7 % — ABNORMAL HIGH (ref 7.1–11.8)
BETA 2: 5.5 % (ref 3.2–6.5)
BETA GLOBULIN: 8 % — AB (ref 4.7–7.2)
Gamma Globulin: 14 % (ref 11.1–18.8)
M-Spike, %: NOT DETECTED g/dL
TOTAL PROTEIN ELP: 4.8 g/dL — AB (ref 6.0–8.3)

## 2014-08-11 MED ORDER — ATORVASTATIN CALCIUM 20 MG PO TABS
20.0000 mg | ORAL_TABLET | Freq: Every day | ORAL | Status: DC
Start: 2014-08-11 — End: 2014-09-04

## 2014-08-11 MED ORDER — POTASSIUM CHLORIDE CRYS ER 20 MEQ PO TBCR: 20.0000 meq | EXTENDED_RELEASE_TABLET | Freq: Two times a day (BID) | ORAL | Status: DC

## 2014-08-11 MED ORDER — ASPIRIN 81 MG PO TBEC: 81.0000 mg | DELAYED_RELEASE_TABLET | Freq: Every day | ORAL | Status: AC

## 2014-08-11 MED ORDER — FUROSEMIDE 20 MG PO TABS: 20.0000 mg | ORAL_TABLET | Freq: Every day | ORAL | Status: DC

## 2014-08-11 MED ORDER — ENSURE COMPLETE PO LIQD: 237.0000 mL | Freq: Two times a day (BID) | ORAL | Status: AC

## 2014-08-11 MED ORDER — ISOSORBIDE MONONITRATE ER 30 MG PO TB24: 30.0000 mg | ORAL_TABLET | Freq: Every day | ORAL | Status: DC

## 2014-08-11 MED ORDER — CARVEDILOL 6.25 MG PO TABS: 6.2500 mg | ORAL_TABLET | Freq: Two times a day (BID) | ORAL | Status: DC

## 2014-08-11 MED ORDER — CARVEDILOL 6.25 MG PO TABS
6.2500 mg | ORAL_TABLET | Freq: Two times a day (BID) | ORAL | Status: DC
  Administered 2014-08-11: 6.25 mg via ORAL
  Filled 2014-08-11 (×3): qty 1

## 2014-08-11 MED ORDER — HYDRALAZINE HCL 25 MG PO TABS: 25.0000 mg | ORAL_TABLET | Freq: Three times a day (TID) | ORAL | Status: DC

## 2014-08-11 MED ORDER — FUROSEMIDE 20 MG PO TABS
20.0000 mg | ORAL_TABLET | Freq: Every day | ORAL | Status: DC
  Administered 2014-08-11: 20 mg via ORAL
  Filled 2014-08-11: qty 1

## 2014-08-11 NOTE — Telephone Encounter (Signed)
TCM; pt is currently still in hospital under palliative care.

## 2014-08-11 NOTE — Care Management Note (Signed)
CARE MANAGEMENT NOTE 08/11/2014  Patient:  Monica Rhodes, Monica Rhodes   Account Number:  [id number]  Date Initiated:  08/03/2014  Documentation initiated by:  AMERSON,JULIE  Subjective/Objective Assessment:   Pt adm on 08/02/14 with CHF, bradycardia.  PTA, pt lives at home with son.     Action/Plan:   Will follow for dc needs as pt progresses.  PT eval pending.   Anticipated DC Date:  08/11/2014   Anticipated DC Plan:  [place]  CM consult      Choice offered to / List presented to:  C-4 Adult Children   DME arranged  [facility]  3-N-1      DME agency  [place] arranged  HH-1 RN  [place].   Status of service:  Completed, signed off Medicare Important Message given?  YES (If response is "NO", the following Medicare IM given date fields will be blank) Date Medicare IM given:  08/05/2014 Medicare IM given by:  AMERSON,JULIE Date Additional Medicare IM given:  08/09/2014 Additional Medicare IM given by:  [facility] JEFFRIES  Discharge Disposition:  [place]  Per UR Regulation:  Reviewed for med. necessity/level of care/duration of stay  If discussed at [place] of Stay Meetings, dates discussed:    Comments:  08/11/2014 Sats for home oxygen obtained and call placed to [facility] re oxygen and 3:1 as well as plan to d/c this PM. CRoyal RN MPH, case manager, [phone]  08/09/14 10:00 Cm met with pt and two sons in room to offer choice of home health agency. Pt's son, Elenore Rota [phone] lives with pt and will be primary contact. [name] is daughter, [phone] and is secondary contact.  Address is correct on facesheet.  [facility] will render HHPT/RN services and referral called to [facility] rep, [name] with contact information.  No other CM needs were communicated.  Mariane Masters, BSN, [place].  08/07/14 Ellan Lambert, RN, BSN [phone] Met with pt's son and dtr in law to discuss [facility]  arrangements.  They prefer that Case Mgr speak with pt's son that lives with her.  Will ask weekend case mgr to follow up.  [place] HH list left with son at bedside for review.

## 2014-08-11 NOTE — Discharge Summary (Addendum)
Physician Discharge Summary  Monica Rhodes QTM:[id number] DOB: [birth year] DOA: 08/02/2014  PCP: Elyn Peers, MD  Admit date: 08/02/2014 Discharge date: 08/11/2014  Time spent: 45 minutes  Recommendations for Outpatient Follow-up:  1. Going home with home health heart failure RN, PT 2. [facility] will follow as well 3. F/u [facility] as outpt  Discharge Condition: stable Diet recommendation: low sodium, heart healthy  Discharge Diagnoses:  Principal Problem:   Acute systolic and diastolic CHF (congestive heart failure) Active Problems:   History of colectomy   Hypertension   CAD (coronary artery disease)   Malnutrition of moderate degree   Atrial flutter   Pericardial effusion    History of present illness:  Monica Rhodes is a 81 y.o.frail elderly female with HTN and A-fib presenting with dyspnea, orthopnea and severe pedal edema. I had a long discussion with her and her son about her medications - they initially believed that she had been taking Lasix at home and this was discontinued about 1 mo ago and she was switched to [place], but after reviewing all of her medications (old and new) it appears she was previously on Triamterene/ HCTZ alone with Amlodipine (separately) which was switched to Tribenzor about 2 wks ago. The son feels that her BP is much better controlled on Tribenzor than it had been in the past.    Hospital Course:  Principal Problem:  Acute systolic and diastolic congestive heart failure - Echo report reveals that she has systolic heart failure with an EF of 35%- repeat ECHO also mentioned G2 diastolic dysfunction - cardiology managing Lasix-- became over diuresed and orthostatic and therefore was hydrated -despite being orthostatic, pleural effusions never resolved - cardiology has resumed lasix at a low dose to prevent over diuresis- they will follow closely at home - she will go home with O2 as pulse ox drops to 89% when ambulating - cardiology recommended  Palliative care who were consulted-  per Palliative care attending, the patient does not have a qualifying diagnosis and patient's family declined hospice/ palliative care as well -she will have Home health at home  Active Problems: Orthostatic hypotension/ severe weakness - hydrated well and now improved  Possible tamponade/moderate pericardial effusion -Reviewed echo report today which has findings that may be consistent with tamponade on. The patient ddid not clinically fit the picture of cardiac tamponade -cardiology initially was recommending to repeat ECHO in 1wk-  - ECHO repeated here yesterday- effusion unchanged - further work up included  a U pro:Cr ratio- which reveals non- nephrotic range proteinuria of 1.98 gm daily  Mildly elevated Troponin - likely in related to CHF- no chest pain- has not had an ischemic work up- keep on ASA and Lipitor   Atrial flutter -presuming this is a new diagnosis- noted incidentally on EKG on admission - no h/o palpitations- no record of this in chart and no sign of it on old EKGs that are available in [place] for rate control - to be managed by cardiology -Initially started Lovenox for anticoagulation- cardiology recommends to avoid anticoagulation at this time- have stopped Lovenox  Malnutrition of moderate degree with hypoalbuminemia - added ensure   Aspiration? Dysphagia? - SLP eval ordered- no obvious signs of aspiration- cont regular diet with thin liquids  Bradycardia HR dropped into 30s and 2.19 sec pause noted on tele- held Clonidine and Digoxin and decreased the dose of Metoprolol - heart rate is currently stable  - continuing to hold clonidine and digoxin- now on Coreg per  cadiology  Acute renal failure on CKD 3 - may be due to Lasix- improved when off Lasix  - holding ARB- on hydralazine and Imdur for afterload reduction -U pro electrophoresis([facility])  done and is pending-     Hypertension - apparently was  difficult to control at home but appears to be reasonably controlled here - due to bradycardia, have had to hold Clonidine and due to renal failure, holding ARB - added Hydralazine and Imdur in place of above mentioned held medications   Procedures:  ECHO 2/22 Left ventricle: Diffuse hypokinesis with inferior wall worse. The cavity size was severely dilated. Wall thickness was normal. The estimated ejection fraction was 35%. - Mitral valve: There was mild regurgitation. - Left atrium: The appendage was moderately to severely dilated. - Right ventricle: Moderate circumferential effusion . No respirometer or phased doppler done. Howerve there dose appear to be an underfilled RV with diastolic collapse and dilated IVC with blunted contraction with inspiration. Suggestive of tamponade physiology Clinical correlation suggested. - Atrial septum: There was a patent foramen ovale. - Pericardium, extracardiac: Moderate circumferential effusion see above. There was a left pleural effusion.  ECHO 2/29 Normal LV size with EF 35-40%, wall motion abnormalities as above. Moderate diastolic dysfunction. The RV was normal in size and systolic function. Mild MR. There was a moderate pericardial effusion that appeared relatively unchanged compared to the prior study. There was no definite tamponade.  Consultations:  Cardiology  Palliative care  Discharge Exam: Filed Weights   08/09/14 0549 08/10/14 0411 08/11/14 0506  Weight: 50.1 kg (110 lb 7.2 oz) 52 kg (114 lb 10.2 oz) 53.1 kg (117 lb 1 oz)   Filed Vitals:   08/11/14 0506  BP: 146/56  Pulse: 67  Temp: 97.7 F (36.5 C)  Resp: 20    General: AAO x 3, no distress Cardiovascular: RRR, no murmurs  Respiratory: clear to auscultation bilaterally GI: soft, non-tender, non-distended, bowel sound positive MSK: no pedal edema  Discharge Instructions You were cared for by a hospitalist during your hospital stay. If  you have any questions about your discharge medications or the care you received while you were in the hospital after you are discharged, you can call the unit and asked to speak with the hospitalist on call if the hospitalist that took care of you is not available. Once you are discharged, your primary care physician will handle any further medical issues. Please note that NO REFILLS for any discharge medications will be authorized once you are discharged, as it is imperative that you return to your primary care physician (or establish a relationship with a primary care physician if you do not have one) for your aftercare needs so that they can reassess your need for medications and monitor your lab values.  Discharge Instructions    (HEART FAILURE PATIENTS) Call MD:  Anytime you have any of the following symptoms: 1) 3 pound weight gain in 24 hours or 5 pounds in 1 week 2) shortness of breath, with or without a dry hacking cough 3) swelling in the hands, feet or stomach 4) if you have to sleep on extra pillows at night in order to breathe.    Complete by:  As directed      Diet - low sodium heart healthy    Complete by:  As directed      Increase activity slowly    Complete by:  As directed             Medication  List    STOP taking these medications        cloNIDine 0.2 MG tablet  Commonly known as:  CATAPRES     CORICIDIN HBP COLD/FLU PO     digoxin 0.125 MG tablet  Commonly known as:  LANOXIN     enalapril 20 MG tablet- was not taking this  Commonly known as:  VASOTEC     fenofibrate 160 MG tablet     metoprolol 50 MG tablet  Commonly known as:  LOPRESSOR     Olmesartan-Amlodipine-HCTZ 40-5-25 MG Tabs     triamterene-hydrochlorothiazide 37.5-25 MG per capsule- she was not taking this  Commonly known as:  DYAZIDE      TAKE these medications        amLODipine 10 MG tablet  Commonly known as:  NORVASC  Take 1 tablet (10 mg total) by mouth daily.     aspirin 81 MG EC  tablet  Take 1 tablet (81 mg total) by mouth daily.     atorvastatin 20 MG tablet  Commonly known as:  LIPITOR  Take 1 tablet (20 mg total) by mouth daily at 6 PM.     carvedilol 6.25 MG tablet  Commonly known as:  COREG  Take 1 tablet (6.25 mg total) by mouth 2 (two) times daily with a meal.     feeding supplement (ENSURE COMPLETE) Liqd  Take 237 mLs by mouth 2 (two) times daily between meals.     furosemide 20 MG tablet  Commonly known as:  LASIX  Take 1 tablet (20 mg total) by mouth daily.     hydrALAZINE 25 MG tablet  Commonly known as:  APRESOLINE  Take 1 tablet (25 mg total) by mouth 3 (three) times daily.     isosorbide mononitrate 30 MG 24 hr tablet  Commonly known as:  IMDUR  Take 1 tablet (30 mg total) by mouth daily.     LINZESS 145 MCG Caps capsule  Generic drug:  Linaclotide  Take 145 mcg by mouth daily.     nitroGLYCERIN 0.4 MG SL tablet  Commonly known as:  NITROSTAT  Place 0.4 mg under the tongue every 5 (five) minutes as needed. For chest pain     potassium chloride SA 20 MEQ tablet  Commonly known as:  K-DUR,KLOR-CON  Take 1 tablet (20 mEq total) by mouth 2 (two) times daily.       Allergies  Allergen Reactions  . Promethazine Other (See Comments)    Unknown        Follow-up Information    Follow up with [name], PA-C.   Specialty:  Physician Assistant   Why:  8:30 AM   Contact information:   [address] [place] [address] [phone]       Follow up with [facility].   Why:  home health physical therapy and nurse   Contact information:   [address] [place] [address] [phone]        The results of significant diagnostics from this hospitalization (including imaging, microbiology, ancillary and laboratory) are listed below for reference.    Significant Diagnostic Studies: Dg Chest 2 View  08/04/2014   CLINICAL DATA:  81 year old female with a history of pulmonary  edema and weakness.  EXAM: CHEST - 2 VIEW  COMPARISON:  08/02/2014, CT abdomen 04/16/2012, plain film 04/17/2011 and 04/14/2011  FINDINGS: Cardiomediastinal silhouette unchanged in size and contour. Cardiomegaly persists.  Atherosclerotic calcifications of the aortic arch.  Dense opacities at the bilateral lung bases with obscuration of the hemidiaphragms.  Interlobular septal thickening.  Diffuse osteopenia. Degenerative changes of the bilateral shoulders.  IMPRESSION: Persisting pulmonary edema and bibasilar pleural effusions with associated atelectasis.  Cardiomegaly.  Atherosclerosis  Signed,  Dulcy Fanny. Earleen Newport, DO  [facility]  [facility]   Electronically Signed   By: Corrie Mckusick D.O.   On: 08/04/2014 09:12   Dg Chest 2 View  08/02/2014   CLINICAL DATA:  High blood pressure and shortness of breath for 2 days.  EXAM: CHEST  2 VIEW  COMPARISON:  04/17/2011  FINDINGS: The heart is enlarged. There is tortuosity, ectasia and calcification of the thoracic aorta. There are bilateral pleural effusions, right greater than left and interstitial pulmonary edema. The bony thorax is intact  IMPRESSION: CHF with bilateral effusions, right greater than left.   Electronically Signed   By: Marijo Sanes M.D.   On: 08/02/2014 15:14   Dg Chest Port 1 View  08/10/2014   CLINICAL DATA:  Dyspnea.  Pleural effusions and pulmonary edema.  EXAM: PORTABLE CHEST - 1 VIEW  COMPARISON:  08/09/2014 and 08/04/2014  FINDINGS: There is persistent cardiomegaly with bilateral pleural effusions, slightly increased on the right. Pulmonary vascularity is normal. Slight residual haziness in the left perihilar region consistent with pulmonary edema.  No acute osseous abnormalities.  IMPRESSION: 1. Slight residual left perihilar pulmonary edema. 2. Persistent bilateral effusions, slightly increased on the right.   Electronically Signed   By: Lorriane Shire M.D.   On: 08/10/2014 07:16   Dg Chest  Port 1 View  08/09/2014   CLINICAL DATA:  Dyspnea.  Shortness of breath today.  EXAM: PORTABLE CHEST - 1 VIEW  COMPARISON:  Two-view chest x-ray 08/04/2014  FINDINGS: The heart is enlarged. There straightening of the AP window compared to prior studies. This raises the possibility of pericardial effusion. Bilateral pleural effusions are similar to the prior study. Mild edema is unchanged. Bibasilar airspace disease likely reflects atelectasis. Degenerative changes are noted at the shoulders bilaterally.  IMPRESSION: 1. Cardiomegaly with straightening of the AP window air raising concern for a pericardial effusion. 2. Persistent edema and bilateral effusions compatible with congestive heart failure. 3. Bibasilar airspace disease likely reflects atelectasis.   Electronically Signed   By: San Morelle M.D.   On: 08/09/2014 16:08    Microbiology: No results found for this or any previous visit (from the past 240 hour(s)).   Labs: Basic Metabolic Panel:  Recent Labs Lab 08/05/14 0433 08/06/14 0341 08/08/14 1320 08/11/14 0522  NA 138 132* 135 138  K 3.7 4.1 3.9 4.3  CL 95* 90* 95* 96  CO2 35* 32 36* 37*  GLUCOSE 108* 92 227* 106*  BUN 44* 48* 44* 37*  CREATININE 1.77* 1.72* 1.34* 1.20*  CALCIUM 8.7 8.3* 8.4 8.5   Liver Function Tests:  Recent Labs Lab 08/11/14 0522  AST 46*  ALT 15  ALKPHOS 34*  BILITOT 0.6  PROT 5.4*  ALBUMIN 2.7*   No results for input(s): LIPASE, AMYLASE in the last 168 hours. No results for input(s): AMMONIA in the last 168 hours. CBC:  Recent Labs Lab 08/08/14 1320  WBC 5.5  HGB 11.5*  HCT 36.3  MCV 87.3  PLT 219   Cardiac Enzymes: No results for input(s): CKTOTAL, CKMB, CKMBINDEX, TROPONINI in the last 168 hours. BNP: BNP (last 3 results)  Recent Labs  08/02/14 1407  BNP 2079.5*    ProBNP (last  3 results) No results for input(s): PROBNP in the last 8760 hours.  CBG: No results for input(s): GLUCAP in the last 168  hours.     SignedDebbe Odea, MD [facility] 08/11/2014, 11:02 AM

## 2014-08-11 NOTE — Progress Notes (Signed)
Patient Name: Monica Rhodes Date of Encounter: 08/11/2014  Primary Cardiologist: Dr. Ron Parker   Principal Problem:   Acute systolic CHF (congestive heart failure) Active Problems:   History of colectomy   Hypertension   CAD (coronary artery disease)   Malnutrition of moderate degree   Atrial flutter   Pericardial effusion   Congestive heart disease    SUBJECTIVE  Denies any CP. Looking much better compare to yesterday morning. Feeling good today.   CURRENT MEDS . amLODipine  10 mg Oral Daily  . antiseptic oral rinse  7 mL Mouth Rinse BID  . aspirin EC  81 mg Oral Daily  . atorvastatin  20 mg Oral q1800  . carvedilol  6.25 mg Oral BID WC  . enoxaparin (LOVENOX) injection  30 mg Subcutaneous Daily  . feeding supplement (ENSURE COMPLETE)  237 mL Oral BID BM  . feeding supplement (GLUCERNA SHAKE)  237 mL Oral Q24H  . hydrALAZINE  25 mg Oral TID  . isosorbide mononitrate  30 mg Oral Daily  . Linaclotide  145 mcg Oral Daily  . multivitamin with minerals  1 tablet Oral Daily  . sodium chloride  3 mL Intravenous Q12H    OBJECTIVE  Filed Vitals:   08/10/14 1447 08/10/14 1726 08/10/14 2100 08/11/14 0506  BP: 112/62 140/84 147/75 146/56  Pulse: 74 71 75 67  Temp: 97.6 F (36.4 C)  98 F (36.7 C) 97.7 F (36.5 C)  TempSrc: Oral  Oral Oral  Resp: 24  20 20   Height:      Weight:    117 lb 1 oz (53.1 kg)  SpO2: 97%  97% 94%    Intake/Output Summary (Last 24 hours) at 08/11/14 1001 Last data filed at 08/11/14 0832  Gross per 24 hour  Intake   1023 ml  Output   1351 ml  Net   -328 ml   Filed Weights   08/09/14 0549 08/10/14 0411 08/11/14 0506  Weight: 110 lb 7.2 oz (50.1 kg) 114 lb 10.2 oz (52 kg) 117 lb 1 oz (53.1 kg)    PHYSICAL EXAM  General: Pleasant. NAD Neuro: Alert and oriented X 3. Moves all extremities spontaneously. Psych: Normal affect. HEENT:  Normal  Neck: Supple without bruits or JVD. Lungs:  Resp regular and unlabored. Decreased breath sound in  bilateral bases Heart: irregular. no s3, s4, or murmurs. Abdomen: Soft, non-distended, BS + x 4. Colostomy bad present, no sign of infection. Extremities: No clubbing, cyanosis or edema. Wound dressing present  Accessory Clinical Findings  CBC  Recent Labs  08/08/14 1320  WBC 5.5  HGB 11.5*  HCT 36.3  MCV 87.3  PLT 527   Basic Metabolic Panel  Recent Labs  08/08/14 1320 08/11/14 0522  NA 135 138  K 3.9 4.3  CL 95* 96  CO2 36* 37*  GLUCOSE 227* 106*  BUN 44* 37*  CREATININE 1.34* 1.20*  CALCIUM 8.4 8.5    TELE Off telemetry    ECG  No new EKG  Echocardiogram 08/03/2014  - Left ventricle: Diffuse hypokinesis with inferior wall worse. The cavity size was severely dilated. Wall thickness was normal. The estimated ejection fraction was 35%. - Mitral valve: There was mild regurgitation. - Left atrium: The appendage was moderately to severely dilated. - Right ventricle: Moderate circumferential effusion . No respirometer or phased doppler done. Howerve there dose appear to be an underfilled RV with diastolic collapse and dilated IVC with blunted contraction with inspiration. Suggestive of tamponade physiology Clinical  correlation suggested. - Atrial septum: There was a patent foramen ovale. - Pericardium, extracardiac: Moderate circumferential effusion see above. There was a left pleural effusion.     Radiology/Studies  Dg Chest 2 View  08/04/2014   CLINICAL DATA:  81 year old female with a history of pulmonary edema and weakness.  EXAM: CHEST - 2 VIEW  COMPARISON:  08/02/2014, CT abdomen 04/16/2012, plain film 04/17/2011 and 04/14/2011  FINDINGS: Cardiomediastinal silhouette unchanged in size and contour. Cardiomegaly persists.  Atherosclerotic calcifications of the aortic arch.  Dense opacities at the bilateral lung bases with obscuration of the hemidiaphragms.  Interlobular septal thickening.  Diffuse osteopenia. Degenerative changes of the  bilateral shoulders.  IMPRESSION: Persisting pulmonary edema and bibasilar pleural effusions with associated atelectasis.  Cardiomegaly.  Atherosclerosis  Signed,  Dulcy Fanny. Earleen Newport, DO  [facility]  [facility]   Electronically Signed   By: Corrie Mckusick D.O.   On: 08/04/2014 09:12   Dg Chest 2 View  08/02/2014   CLINICAL DATA:  High blood pressure and shortness of breath for 2 days.  EXAM: CHEST  2 VIEW  COMPARISON:  04/17/2011  FINDINGS: The heart is enlarged. There is tortuosity, ectasia and calcification of the thoracic aorta. There are bilateral pleural effusions, right greater than left and interstitial pulmonary edema. The bony thorax is intact  IMPRESSION: CHF with bilateral effusions, right greater than left.   Electronically Signed   By: Marijo Sanes M.D.   On: 08/02/2014 15:14   Dg Chest Port 1 View  08/10/2014   CLINICAL DATA:  Dyspnea.  Pleural effusions and pulmonary edema.  EXAM: PORTABLE CHEST - 1 VIEW  COMPARISON:  08/09/2014 and 08/04/2014  FINDINGS: There is persistent cardiomegaly with bilateral pleural effusions, slightly increased on the right. Pulmonary vascularity is normal. Slight residual haziness in the left perihilar region consistent with pulmonary edema.  No acute osseous abnormalities.  IMPRESSION: 1. Slight residual left perihilar pulmonary edema. 2. Persistent bilateral effusions, slightly increased on the right.   Electronically Signed   By: Lorriane Shire M.D.   On: 08/10/2014 07:16   Dg Chest Port 1 View  08/09/2014   CLINICAL DATA:  Dyspnea.  Shortness of breath today.  EXAM: PORTABLE CHEST - 1 VIEW  COMPARISON:  Two-view chest x-ray 08/04/2014  FINDINGS: The heart is enlarged. There straightening of the AP window compared to prior studies. This raises the possibility of pericardial effusion. Bilateral pleural effusions are similar to the prior study. Mild edema is unchanged. Bibasilar airspace disease likely reflects  atelectasis. Degenerative changes are noted at the shoulders bilaterally.  IMPRESSION: 1. Cardiomegaly with straightening of the AP window air raising concern for a pericardial effusion. 2. Persistent edema and bilateral effusions compatible with congestive heart failure. 3. Bibasilar airspace disease likely reflects atelectasis.   Electronically Signed   By: San Morelle M.D.   On: 08/09/2014 16:08    ASSESSMENT AND PLAN  1. Acute systolic heart failure  - Echo 08/03/2014 EF 35%, mild MR, moderate pericardial effusion, +PFO  - lasix stopped this AM as she had severe weakness, given 250 cc bolus of fluid  - difficult to manage her fluid level, had some over diuresis on 2/25, given soft fluid hydration overnight with some improvement. IVF discontinued on 2/26.   - overall poor prognosis, seen by palliative care team yesterday.  - SOB resolved this morning, she appears to have a very narrow range for euvolemic. Weight not accurate. Will go by symptom.   -  she was previously on triameterine-HCTZ combo. Will start low dose lasix today. Will discuss with MD regarding wall motion abnormality seen on echo, ?if influenced by pericardial effusion. She may need ischemic workup at some point, however does not appear to be urgent given lack of any CP recently and borderline trop on admission. Likely can be discharged later today or tomorrow if she remain stable. Will arrange TOC followup   2. HTN  3. Aflutter, rate controlled  - CHA2DS2-Vasc 6 (age, HTN, DM, HF, female)  - no anticoagulation at this time because of pericardial effusion  4. Pericardial effusion  - Echo 08/03/2014 EF 35%, moderate circumferential effusion, does appear to be an underfilled RV with diastolic collapse and dilated IVC with blunted contraction with inspiration  - Echo 08/10/2014, EF 35-40%, basal inferior, inferolateral, and basal to mid anterolateral hypokinesis, grade 2 diastolic dysfunction, mild MR, no change to  pericardial effusion, no sign of temponade  5. H/o MI in 2012, unable to gain access via R radial or both femoral arteries despite multiple attempt, medical management  6. Significant orthostatic hypotension: SBP dropped from 146 to 100 upon standing  - will need to allow for higher BP when lying down to avoid significant drop in BP upon standing  Signed, Almyra Deforest PA-C Pager: [phone] As above, patient seen and examined. She is much improved today. Mild dyspnea but no chest pain. Repeat echocardiogram yesterday showed no change and moderate pericardial effusion. Patient can be discharged from a cardiac standpoint on present medications. Would discharge on Lasix 20 mg daily and she will need her potassium and renal function checked on March 4. She will need close follow-up with Dr. Ron Parker. We would like to be conservative given her overall medical condition. Kirk Ruths

## 2014-08-11 NOTE — Consult Note (Signed)
Referral received in progression meeting. Met with the patient and her son at bedside to offer [facility] services as benefit of her primary MD provider insurance. Patient agreeable to services and will receive post hospital discharge call and will be evaluated for monthly home visits. Consent form signed and [facility] folder with information given.  Also, folder with [facility] folder with information given.  Of note, [facility] services does not replace or interfere with any services that are arranged by inpatient case management or social work. For additional questions or referrals please contact, Natividad Brood, RN BSN CCM, [facility] at [phone].

## 2014-08-11 NOTE — Progress Notes (Signed)
SATURATION QUALIFICATIONS: (This note is used to comply with regulatory documentation for home oxygen)  Patient Saturations on Room Air at Rest = 87%  Patient Saturations on Room Air while Ambulating = 87%  Patient Saturations on 2 Liters of oxygen while Ambulating = 95%  Please briefly explain why patient needs home oxygen: pt needs home o2 in order to be comfortable

## 2014-08-11 NOTE — Progress Notes (Signed)
Substantial proteinuria, hypoalbuminemia in uncompensated heart failure with pericardial and pleural effusions. Repeat ECHO EF 35-40% unchanged effusion. She remains symptomatic, no plans noted for any interventions for pleural effusions, no plans noted for addition of ACE-I for protienuria,  palliative support with O2 for comfort strongly recommended and back up SL Roxanol 5mg  SL q2 prn for severe dyspnea at home if she becomes in significant distress. Hospice education provided in the event she declines which is inevitable, but for now she is electing home with PT effort and would not likely qualify for Hospice services based on those stated goals for this admission. She is now a DNR. Family expressed confidence in Dr. Criss Rosales who has been her PCP for many years to follow up on her condition and assist with a Hospice transition if that is appropriate based on her goals of care.  Appreciate the willingness of the primary team to explore her persistent and rapid onset peripheral edema and dyspnea thoroughly given the impact both of these have on her QOL and on hospice eligibity. Prognostication also remains difficult.PMT will sign off.   Lane Hacker, DO Palliative Medicine [phone]

## 2014-08-11 NOTE — Progress Notes (Signed)
DC IV, DC Tele, DC Home. Discharge instructions and home medications discussed with patient and patient's sons. Patient and sons denied any questions or concerns at this time. Patient leaving unit via wheelchair with O2 and appears in no acute distress.

## 2014-08-12 ENCOUNTER — Telehealth: Payer: Self-pay | Admitting: Cardiology

## 2014-08-12 NOTE — Telephone Encounter (Signed)
Pt c/o medication issue:  1. Name of Medication: Digoxin  2. How are you currently taking this medication (dosage and times per day)? Has taken this med for the past 20 years  3. Are you having a reaction (difficulty breathing--STAT)? No  4. What is your medication issue? Daughter called, states the Pt went to the ER recently with a list of her medications. While she was in the hospital the Doctor on duty switched all of her medications. She reports there was one pill that wasnt swtiched and she wants to be sure that the pt should continue to take Digoxin or was it a mistake that its still on her meds list being that everything else was changed.   Please call back to discuss

## 2014-08-12 NOTE — Telephone Encounter (Addendum)
[name], the pts daughter, states that the pts Digoxin was stopped at a recent ER visit. She is concerned and wants to know if it is safe for the pt not to take Digoxin.  She is advised, per the pts d/c summary from her ER visit on 08/11/14, that the pts Digoxin is being held because her HR dropped into the 30's and a 2.19 sec pause was noted on telemetry. [name] verbalized understanding and thanked me for my assistance.

## 2014-08-12 NOTE — Telephone Encounter (Signed)
Agree - notes indicate Dig DC'd 2/2 bradycardia [name], PA-C   08/12/2014 5:13 PM

## 2014-08-13 NOTE — Progress Notes (Signed)
This encounter was created in error - please disregard.

## 2014-08-16 ENCOUNTER — Other Ambulatory Visit: Payer: Self-pay | Admitting: Internal Medicine

## 2014-08-16 DIAGNOSIS — I255 Ischemic cardiomyopathy: Secondary | ICD-10-CM | POA: Insufficient documentation

## 2014-08-16 DIAGNOSIS — N183 Chronic kidney disease, stage 3 unspecified: Secondary | ICD-10-CM | POA: Insufficient documentation

## 2014-08-16 NOTE — Progress Notes (Signed)
Cardiology Office Note   Date:  08/17/2014   ID:  Monica Rhodes, DOB Mar 10, 1928, MRN [id number]  PCP:  Elyn Peers, MD  Cardiologist:  Dr. Cleatis Polka     Chief Complaint  Patient presents with  . Congestive Heart Failure  . Pericardial Effusion  . Coronary Artery Disease  . Atrial Flutter  . Hospitalization Follow-up     History of Present Illness: Monica Rhodes is a 81 y.o. female with a hx of CAD s/p NSTEMI in 2012 in setting of bowel obstruction (cath could not engage coronaries due to poor access related to distal calcification of vessels), carotid stenosis with known LICA occlusion, mitral regurgitation, HTN, HL, DM2, COPD, CKD, colon CA.  Last seen by Dr. Cleatis Polka in 2013.  Admitted 2/21-3/1 with a/c combined systolic and diastolic CHF.  She was noted to be in AFlutter upon admission. She was not felt to be a candidate for anticoagulation.  She was treated with rate control strategy. Clonidine and Digoxin were stopped due to bradycardia.  She was noted to have a moderate sized pericardial effusion.  This was stable on repeat echo and no tamponade physiology was noted.  She did become orthostatic with diuresis that had to be held.  She had persistent pleural effusions.  She developed worsening creatinine and ARB was stopped. She was noted to have reduced LVF on echo with EF 35-40%.  She was placed on hydralazine and nitrates.  Creatinine improved when diuresis was held.  She required home O2 for ambulatory hypoxia.  She was seen by palliative care but did not qualify. The family declined hospice or palliative services at home. She was set up for home health.    She returns for FU.  She is here with her son.  Her breathing remains improved.  She is weak but is trying to walk around her house some.  HHPT has not started yet.  She is wearing her O2.  She is sleeping on 2 pillows.  Denies PND.  She denies chest pain, syncope.  No dizziness with standing.  No pleuritic CP, chest  pain while lying supine.  No cough.     Studies/Reports Reviewed Today:  Echocardiogram 08/10/14 - EF 35-40%. Basal Inf, inf-lat, and basal to mid ant-lat HK. Grade 2 diastolic dysfunction. - Mitral valve: There was mild regurgitation. - Left atrium: The atrium was severely dilated. - Right ventricle: The cavity size was normal. Systolic functionwas normal. - Pericardium, extracardiac: Moderate primarily posterior/inferior/RV free wall pericardial effusion. Unchanged from prior study. No definite tamponade. Pleural effusion noted.  Echocardiogram 08/03/14 - Diffuse HK - Inf worse. EF 35%. - Mitral valve: There was mild regurgitation. - Left atrium: The appendage was moderately to severely dilated. - Right ventricle: Moderate circumferential effusion - Findings suggestive of tamponade physiology   - Atrial septum: There was a patent foramen ovale. - Left pleural effusion.  Carotid US 40/9811 LICA 914% (chronic), RICA 40-59%  Cardiac Cath 04/24/11 Not done due to poor access >> med Rx   Past Medical History  Diagnosis Date  . Hypertension   . UTI (urinary tract infection)   . SBO (small bowel obstruction)     Recurrent, prolonged hospitalization November, 2012  . Protein calorie malnutrition   . Hyperlipidemia   . DM (diabetes mellitus)   . COPD (chronic obstructive pulmonary disease)   . PVD (peripheral vascular disease)   . Colon cancer   . Bradycardia   . Tobacco abuse   . CAD (coronary  artery disease)     Non-STEMI November, 2012,, Cardiac catheterization was attempted, despite significant efforts catheters could not be progressed to the coronary arteries ., Therefore medical therapy  . Carotid artery disease     Doppler  April 16, 2011,, total occlusion LICA, 47-42% R. ICA  . PAD (peripheral artery disease)     Suggestions significant stenosis distal aorta by CT angiogram  November, 2012  . Ejection fraction     EF 60%, echo, November, 2012,  . Mitral  regurgitation     Moderate, echo, November, 2012  . Right ventricular dysfunction     Moderate, echo, November, 2012  . Edema     December, 2012  . Rash     Rash  probably related to hydralazine  December, 2012    Past Surgical History  Procedure Laterality Date  . Colostomy    . Abdominoperineal proctocolectomy    . Left heart catheterization with coronary angiogram N/A 04/24/2011    Procedure: LEFT HEART CATHETERIZATION WITH CORONARY ANGIOGRAM;  Surgeon: Josue Hector, MD;  Location[facility] C[facility] LAB;  Service: Cardiovascular;  Laterality: N/A;     Current Outpatient Prescriptions  Medication Sig Dispense Refill  . amLODipine (NORVASC) 10 MG tablet Take 1 tablet (10 mg total) by mouth daily. 30 tablet 0  . aspirin EC 81 MG EC tablet Take 1 tablet (81 mg total) by mouth daily.    Marland Kitchen atorvastatin (LIPITOR) 20 MG tablet Take 1 tablet (20 mg total) by mouth daily at 6 PM. 30 tablet 0  . carvedilol (COREG) 6.25 MG tablet Take 1 tablet (6.25 mg total) by mouth 2 (two) times daily with a meal. 60 tablet 0  . feeding supplement, ENSURE COMPLETE, (ENSURE COMPLETE) LIQD Take 237 mLs by mouth 2 (two) times daily between meals. 60 Bottle 0  . furosemide (LASIX) 20 MG tablet Take 1 tablet (20 mg total) by mouth daily. 30 tablet 0  . hydrALAZINE (APRESOLINE) 25 MG tablet Take 1 tablet (25 mg total) by mouth 3 (three) times daily. 90 tablet 0  . isosorbide mononitrate (IMDUR) 30 MG 24 hr tablet Take 1 tablet (30 mg total) by mouth daily. 30 tablet 0  . LINZESS 145 MCG CAPS capsule Take 145 mcg by mouth daily.  4  . potassium chloride SA (K-DUR,KLOR-CON) 20 MEQ tablet Take 1 tablet (20 mEq total) by mouth 2 (two) times daily. 30 tablet 0  . nitroGLYCERIN (NITROSTAT) 0.4 MG SL tablet Place 0.4 mg under the tongue every 5 (five) minutes as needed. For chest pain     No current facility-administered medications for this visit.    Allergies:   Promethazine    Social History:  The patient  reports  that she has been smoking Cigarettes.  She has been smoking about 0.50 packs per day. She does not have any smokeless tobacco history on file. She reports that she does not drink alcohol or use illicit drugs.   Family History:  The patient's family history includes Coronary artery disease in an other family member; Heart attack in her mother; Hypertension in her father, mother, and sister. There is no history of Stroke.    ROS:   Please see the history of present illness.   Review of Systems  HENT: Positive for hearing loss.   Respiratory: Positive for shortness of breath. Negative for cough.   Hematologic/Lymphatic: Bruises/bleeds easily.  Musculoskeletal: Positive for joint pain.  Neurological: Positive for loss of balance.  All other systems reviewed and are  negative.    PHYSICAL EXAM: VS:  BP 110/50 mmHg  Pulse 74  Ht 5\' 1"  (1.549 m)  Wt 118 lb (53.524 kg)  BMI 22.31 kg/m2    Wt Readings from Last 3 Encounters:  08/17/14 118 lb (53.524 kg)  08/11/14 117 lb 1 oz (53.1 kg)  04/18/12 124 lb 4.8 oz (56.382 kg)     GEN: Well nourished, well developed, in no acute distress HEENT: normal Neck: no JVD at 90 degrees, no masses Cardiac:  Normal S1/S2, irreg irreg rhythm; no murmur, no rubs or gallops, trace to 1+ bilat LE edema  Respiratory:  Decreased breath sounds at the bases bilaterally, no wheezing, rhonchi, ? Faint crackles at the bases GI: soft, nontender, nondistended  MS: no deformity or atrophy Skin: warm and dry  Neuro:  CNs II-XII intact, Strength and sensation are intact Psych: Normal affect   EKG:  EKG is ordered today.  It demonstrates:   Atrial fibrillation, HR 74   Recent Labs: 08/02/2014: B Natriuretic Peptide 2079.5* 08/08/2014: Hemoglobin 11.5*; Platelets 219 08/11/2014: ALT 15; BUN 37*; Creatinine 1.20*; Potassium 4.3; Sodium 138    Lipid Panel    Component Value Date/Time   CHOL 184 04/17/2011 0538   TRIG 216* 04/17/2011 0538      ASSESSMENT AND  PLAN:  Chronic combined systolic and diastolic CHF (congestive heart failure):  Volume stable.  Weights at home 113-114.  She continues to have improved breathing and no further PND.  Continue current dose of Lasix.  We discussed how to take lasix on a sliding scale.    -  BMET today.    -  Continue O2.  Ischemic cardiomyopathy:  This is being managed conservatively.  She is off of ACEI/ARB due to CKD.  Continue beta blocker, hydralazine, nitrates.      -  She is not a candidate for AICD.  Coronary artery disease:  No angina.  Continue ASA, statin, beta blocker, nitrates.  Pericardial effusion:  No tamponade physiology noted on repeat echo in the hospital.    -  Arrange repeat echo (limited) in 2-3 weeks.   Atrial fibrillation and flutter:   She is felt to be a poor candidate for anticoagulation.  HR is controlled.  Continue ASA only.   CKD (chronic kidney disease), unspecified stage:  Repeat BMET today.  Essential hypertension:  Controlled.   Bilateral carotid artery disease:  Continue ASA, statin.   Hyperlipidemia:  Continue statin.    Current medicines are reviewed at length with the patient today.  The patient does not have concerns regarding medicines.  The following changes have been made:  As above.  Labs/ tests ordered today include:  Orders Placed This Encounter  Procedures  . Basic Metabolic Panel (BMET)  . EKG 12-Lead  . 2D Echocardiogram without contrast    Disposition:   FU with Dr. Cleatis Polka 3-4 weeks.   Signed, Versie Starks, MHS 08/17/2014 8:28 AM    [facility] [address], [place], [place]  [address] Phone: [phone]; Fax: [phone]

## 2014-08-17 ENCOUNTER — Ambulatory Visit (INDEPENDENT_AMBULATORY_CARE_PROVIDER_SITE_OTHER): Payer: Medicare Other | Admitting: Physician Assistant

## 2014-08-17 ENCOUNTER — Encounter: Payer: Self-pay | Admitting: Physician Assistant

## 2014-08-17 ENCOUNTER — Telehealth: Payer: Self-pay | Admitting: *Deleted

## 2014-08-17 VITALS — BP 110/50 | HR 74 | Ht 61.0 in | Wt 118.0 lb

## 2014-08-17 DIAGNOSIS — I739 Peripheral vascular disease, unspecified: Secondary | ICD-10-CM

## 2014-08-17 DIAGNOSIS — I5042 Chronic combined systolic (congestive) and diastolic (congestive) heart failure: Secondary | ICD-10-CM

## 2014-08-17 DIAGNOSIS — I2510 Atherosclerotic heart disease of native coronary artery without angina pectoris: Secondary | ICD-10-CM | POA: Diagnosis not present

## 2014-08-17 DIAGNOSIS — I779 Disorder of arteries and arterioles, unspecified: Secondary | ICD-10-CM

## 2014-08-17 DIAGNOSIS — N189 Chronic kidney disease, unspecified: Secondary | ICD-10-CM | POA: Diagnosis not present

## 2014-08-17 DIAGNOSIS — I10 Essential (primary) hypertension: Secondary | ICD-10-CM

## 2014-08-17 DIAGNOSIS — I3139 Other pericardial effusion (noninflammatory): Secondary | ICD-10-CM

## 2014-08-17 DIAGNOSIS — E785 Hyperlipidemia, unspecified: Secondary | ICD-10-CM

## 2014-08-17 DIAGNOSIS — I4891 Unspecified atrial fibrillation: Secondary | ICD-10-CM

## 2014-08-17 DIAGNOSIS — I319 Disease of pericardium, unspecified: Secondary | ICD-10-CM

## 2014-08-17 DIAGNOSIS — I255 Ischemic cardiomyopathy: Secondary | ICD-10-CM

## 2014-08-17 DIAGNOSIS — I4892 Unspecified atrial flutter: Secondary | ICD-10-CM

## 2014-08-17 DIAGNOSIS — I313 Pericardial effusion (noninflammatory): Secondary | ICD-10-CM

## 2014-08-17 LAB — BASIC METABOLIC PANEL
BUN: 46 mg/dL — AB (ref 6–23)
CO2: 28 mEq/L (ref 19–32)
Calcium: 8.9 mg/dL (ref 8.4–10.5)
Chloride: 107 mEq/L (ref 96–112)
Creatinine, Ser: 1.07 mg/dL (ref 0.40–1.20)
GFR: 51.83 mL/min — ABNORMAL LOW (ref >60.00)
GLUCOSE: 118 mg/dL — AB (ref 70–99)
POTASSIUM: 5.2 meq/L — AB (ref 3.5–5.1)
Sodium: 138 mEq/L (ref 135–145)

## 2014-08-17 MED ORDER — POTASSIUM CHLORIDE CRYS ER 20 MEQ PO TBCR: 20.0000 meq | EXTENDED_RELEASE_TABLET | Freq: Every day | ORAL | Status: DC

## 2014-08-17 NOTE — Patient Instructions (Signed)
Your physician has requested that you have an LIMITED echocardiogram TO REEVALUATE PERICARDIAL EFFUSION, THIS IS TO BE DONE IN 2-3 WEEKS. Echocardiography is a painless test that uses sound waves to create images of your heart. It provides your doctor with information about the size and shape of your heart and how well your heart's chambers and valves are working. This procedure takes approximately one hour. There are no restrictions for this procedure.  Your physician recommends that you schedule a follow-up appointment in: 3-4 WEEKS WITH DR. KATZ  LAB WORK TODAY; BMET  MONITOR WEIGHT AND CALL [phone] IF YOUR WEIGHT IS INCREASED 3 LB'S IN 1 DAY OR 5 LB'S IN 1 WEEK; IF THIS HAPPENS THEN TAKE AN EXTRA DOSE OF LASIX THAT DAY.  WE WILL CHECK WITH [facility] TO SEE IF THEY CAN GET YOU A HUMIDIFIER

## 2014-08-17 NOTE — Telephone Encounter (Signed)
I called pt to go over lab results when the pt asked if I would go over results w/her daughter due to she was [facility]. I s/w [name] advised of results and recommendations.  BMET 3/15. Hold K+ 3/8, resume at K+ 20 meq daily. [name] verbalized understanding.

## 2014-08-19 ENCOUNTER — Telehealth: Payer: Self-pay | Admitting: Cardiology

## 2014-08-19 NOTE — Telephone Encounter (Addendum)
LMTCB

## 2014-08-19 NOTE — Telephone Encounter (Signed)
New Message  Pt daughter called request to [facility] nurse to come in and complete the lab from home

## 2014-08-19 NOTE — Telephone Encounter (Signed)
Monica Rhodes is advised that I spoke with Fraser Din at [facility] and that a nurse is coming to the pts home on 3/15 and while there they will draw a BMET. Monica Rhodes verbalized understanding.

## 2014-08-25 ENCOUNTER — Other Ambulatory Visit: Payer: Medicare Other

## 2014-08-25 ENCOUNTER — Telehealth: Payer: Self-pay | Admitting: Cardiology

## 2014-08-25 MED ORDER — FUROSEMIDE 40 MG PO TABS: 40.0000 mg | ORAL_TABLET | Freq: Every day | ORAL | Status: DC

## 2014-08-25 NOTE — Telephone Encounter (Signed)
[facility] called stating that they started following pt on 08/13/14 and pt weighed 114 lbs and as of today pt weighs 125.8 lbs, pt has bilateral lower extremity edema, negative sob, negative cough. Pt is taking lasix 20mg  1 x per day. If Dr. Ron Parker wants to make any changes please call back and advise.

## 2014-08-25 NOTE — Telephone Encounter (Addendum)
**Note De-Identified  Obfuscation** Per [name], PA-C Monica Rhodes [facility]) is advised that the pt needs to have a BMET drawn today Monica Rhodes states that she drew the BMET today while at the pts home), to increase the pts Lasix to 60 mg daily X 3 days then decrease to 40 mg daily there after and to have a BMET drawn in 1 week from today. Monica Rhodes and the pts daughter, [name], both verbalized understanding and Monica Rhodes states that they will draw the BMET while at the pts home next week.

## 2014-08-26 ENCOUNTER — Encounter: Payer: Self-pay | Admitting: Cardiology

## 2014-08-28 ENCOUNTER — Other Ambulatory Visit: Payer: Self-pay | Admitting: Internal Medicine

## 2014-08-28 ENCOUNTER — Telehealth: Payer: Self-pay | Admitting: *Deleted

## 2014-08-28 DIAGNOSIS — I5042 Chronic combined systolic (congestive) and diastolic (congestive) heart failure: Secondary | ICD-10-CM

## 2014-08-28 MED ORDER — FUROSEMIDE 40 MG PO TABS: 40.0000 mg | ORAL_TABLET | ORAL | Status: DC

## 2014-08-28 NOTE — Telephone Encounter (Signed)
s/w pt's daughter [name] in regards to lab results and me changes . Aware to stop K+, alternate lasix 40 mg QOD with 20 mg QOD, Bmet when she see's Dr. Ron Parker.

## 2014-08-31 ENCOUNTER — Telehealth: Payer: Self-pay | Admitting: Cardiology

## 2014-08-31 DIAGNOSIS — I5042 Chronic combined systolic (congestive) and diastolic (congestive) heart failure: Secondary | ICD-10-CM

## 2014-08-31 MED ORDER — FUROSEMIDE 40 MG PO TABS: ORAL_TABLET | ORAL | Status: DC

## 2014-08-31 NOTE — Telephone Encounter (Signed)
New Message   Per pt daughter- Pt fluid pill does not seem to be working. Please call back and discuss.

## 2014-08-31 NOTE — Telephone Encounter (Signed)
The pt is scheduled to see Dr Ron Parker on 09/04/14 (this Friday).

## 2014-08-31 NOTE — Telephone Encounter (Signed)
**Note De-Identified  Obfuscation** [name] states that the pts weights are as follows:  08/11/14  Weight= 117 lbs 08/14/14  Weight= 116 lbs.  08/26/14  Weight= 124 lbs. 08/27/14  Weight= 124 lbs. 08/28/14  Weight= 124 lbs. 08/29/14  Weight= 124 lbs 08/30/14  Weight= 124 lbs. 08/31/14  Weight= 124 lbs. She insist that the pt adds no salt to her food at all and only drinks diet coke and coffee. [name] is advised to start reading the labels on all of the foods and drinks that the pt consumes as soft drinks are full of sodium. [name] states that the pts legs are not worse just that they are not getting better and that the pt now has a pin size whole in the swollen area on her leg and her sock gets a little wet from time to time.  She is advised that I will discuss with [name], PA-C and call her back.

## 2014-08-31 NOTE — Telephone Encounter (Signed)
**Note De-Identified  Obfuscation** The pts daughter states that the pts legs are swelling more everyday. She does not know the pts weight and is unsure about how much salt she is eating. She states she is on her way to the pts house after she makes a couple of stops and will call me back with the pts weight and her diet.

## 2014-08-31 NOTE — Telephone Encounter (Signed)
Per [name], PA-C Monica Rhodes is advised to increase the pt Furosemide back to 40 mg daily X 3 days then decrease back down to 40 mg one day and alternate with 20 mg the next day, to restrict salt in her diet and to elevate her legs as much as possible. Monica Rhodes verbalized understanding and agrees with plan.

## 2014-09-01 ENCOUNTER — Telehealth: Payer: Self-pay | Admitting: Physician Assistant

## 2014-09-01 DIAGNOSIS — I5042 Chronic combined systolic (congestive) and diastolic (congestive) heart failure: Secondary | ICD-10-CM

## 2014-09-01 NOTE — Telephone Encounter (Signed)
[place] with [facility] called ( Requests to have this message go to [employer] nurse/office)  states that the pt's wt is not going down it is currently 126.4 lbs. She also reports the pt has bilateral edema and her right leg is seeping serous fluid. She also requests orders to wrap the pt's leg. Please call back to discuss.

## 2014-09-01 NOTE — Telephone Encounter (Signed)
I spoke with Pamala Hurry, [facility].  She asked me to call pt's son, she was unable to look in her computer and get medical information at this time.

## 2014-09-01 NOTE — Telephone Encounter (Signed)
LMTCB for pt and son, Fritz Pickerel.

## 2014-09-01 NOTE — Telephone Encounter (Signed)
F/U        Pt son is returning call from today.     Please call.

## 2014-09-01 NOTE — Telephone Encounter (Signed)
I reviewed with [name], PA,c.  He recommended pt take lasix 40mg  bid today and tomorrow, then take lasix 40mg  daily until office visit with Dr Ron Parker 09/04/14, BMET on 09/03/14 when pt comes for echo.   Pt's son Fritz Pickerel advised, verbalized understanding. Fritz Pickerel states he will discuss with patient.

## 2014-09-01 NOTE — Telephone Encounter (Signed)
Pt states her SOB is about the same as yesterday and  her lower extremity edema is about the same. Pt states she continues to have a pinprick size area around her right ankle that is seeping fluid that saturates her sock.   According to weights reported by [facility] pt's weight is up 2.4lbs from yesterday, this was confirmed by [facility], Pamala Hurry.

## 2014-09-02 ENCOUNTER — Telehealth: Payer: Self-pay | Admitting: Cardiology

## 2014-09-02 ENCOUNTER — Encounter: Payer: Medicare Other | Admitting: Physician Assistant

## 2014-09-02 NOTE — Telephone Encounter (Signed)
New Message  [place] with [facility] called. She reports the pts legs are still weeping and requests a call back for orders to wrapp the patients legs

## 2014-09-02 NOTE — Telephone Encounter (Signed)
**Note De-Identified  Obfuscation** The pts was advised per [name], PA-C yesterday and today to increase Lasix to 40 mg BID until Thursday then decrease to 40 mg once daily until she is seen by Dr Ron Parker in [place] on 3/25. Today the pts HHN states that the pt legs are weeping and that her sock gets wet. Will discuss with Dr Ron Parker.

## 2014-09-02 NOTE — Telephone Encounter (Addendum)
Per Dr Ron Parker this pt needs to be put on tomorrows (09/03/14) flex schedule due to her daily weight gain since her hosp d/c on 3/1. The weeping in her legs can be addressed at that time.   The pts HHN is aware and will advise the family that someone from this office will contact them tomorrow to schedule an appt with the flex for 3/24.

## 2014-09-03 ENCOUNTER — Ambulatory Visit (HOSPITAL_COMMUNITY): Payer: Medicare Other | Attending: Interventional Cardiology

## 2014-09-03 ENCOUNTER — Encounter: Payer: Self-pay | Admitting: Physician Assistant

## 2014-09-03 ENCOUNTER — Other Ambulatory Visit: Payer: Medicare Other

## 2014-09-03 ENCOUNTER — Other Ambulatory Visit: Payer: Self-pay | Admitting: *Deleted

## 2014-09-03 ENCOUNTER — Other Ambulatory Visit (INDEPENDENT_AMBULATORY_CARE_PROVIDER_SITE_OTHER): Payer: Medicare Other

## 2014-09-03 ENCOUNTER — Telehealth: Payer: Self-pay | Admitting: Cardiology

## 2014-09-03 DIAGNOSIS — I359 Nonrheumatic aortic valve disorder, unspecified: Secondary | ICD-10-CM | POA: Diagnosis not present

## 2014-09-03 DIAGNOSIS — E119 Type 2 diabetes mellitus without complications: Secondary | ICD-10-CM | POA: Diagnosis not present

## 2014-09-03 DIAGNOSIS — I10 Essential (primary) hypertension: Secondary | ICD-10-CM | POA: Insufficient documentation

## 2014-09-03 DIAGNOSIS — Z720 Tobacco use: Secondary | ICD-10-CM | POA: Diagnosis not present

## 2014-09-03 DIAGNOSIS — E785 Hyperlipidemia, unspecified: Secondary | ICD-10-CM | POA: Insufficient documentation

## 2014-09-03 DIAGNOSIS — I3139 Other pericardial effusion (noninflammatory): Secondary | ICD-10-CM

## 2014-09-03 DIAGNOSIS — I319 Disease of pericardium, unspecified: Secondary | ICD-10-CM | POA: Insufficient documentation

## 2014-09-03 DIAGNOSIS — I313 Pericardial effusion (noninflammatory): Secondary | ICD-10-CM

## 2014-09-03 DIAGNOSIS — I5042 Chronic combined systolic (congestive) and diastolic (congestive) heart failure: Secondary | ICD-10-CM | POA: Diagnosis not present

## 2014-09-03 NOTE — Telephone Encounter (Signed)
New message    Per requested from [facility] Director to add patient to flex schedule today per request from nurse Jeani Hawking [place],    Patient is scheduled to see Dr. Ron Parker on  3.25.2016 @ 10:00 am     Called patient at home number that listed. Spoke with son Fritz Pickerel regarding his mother need to be seen today per MD request. Patient son advise since she was already in the office for echo patient still have appt that scheduled for tomorrow. Patient will be seen on tomorrow.   Monica Rhodes was notified of situation.  Message attached to [employer] .

## 2014-09-03 NOTE — Telephone Encounter (Signed)
s/w pt's daughter [name] about lab and echo results. [name] verbalized understanding to results given today and was advised to have pt keep her appt 3/25 with Dr. Ron Parker to discuss further echo results in regards to effusion. [name] said thank you.

## 2014-09-03 NOTE — Patient Outreach (Signed)
Transition of care call (discharged 08/11/14) week 4:  Spoke with daughter Monica Rhodes [facility] consent form) who states pt still retaining fluid (legs), they are working on that- switching milligrams- taking 40mg  of Torsemide twice a day.  Daughter states med working, last couple of days lost lbs overnight.  Daughter states pt is at  MD office today for  Ultrasound of heart and to see Dr. Ron Parker tomorrow.   Plan:   RN CM to f/u again with daughter Monica Rhodes on  4/1 for final transition of care as daughter states pt does not always hear people on the phone, needs a hearing aide but will not get one.   RN CM discussed [facility] services which include monthly home visits from a nurse to which daughter states she will talk to family about this and relay back to RN CM on next week's call.

## 2014-09-03 NOTE — Progress Notes (Signed)
2D Echo completed. 09/03/2014 

## 2014-09-04 ENCOUNTER — Encounter: Payer: Self-pay | Admitting: Cardiology

## 2014-09-04 ENCOUNTER — Ambulatory Visit (INDEPENDENT_AMBULATORY_CARE_PROVIDER_SITE_OTHER): Payer: Medicare Other | Admitting: Cardiology

## 2014-09-04 ENCOUNTER — Other Ambulatory Visit: Payer: Medicare Other

## 2014-09-04 VITALS — BP 112/46 | HR 44 | Ht 61.0 in | Wt 126.8 lb

## 2014-09-04 DIAGNOSIS — I4892 Unspecified atrial flutter: Secondary | ICD-10-CM

## 2014-09-04 DIAGNOSIS — I2510 Atherosclerotic heart disease of native coronary artery without angina pectoris: Secondary | ICD-10-CM

## 2014-09-04 DIAGNOSIS — I5042 Chronic combined systolic (congestive) and diastolic (congestive) heart failure: Secondary | ICD-10-CM

## 2014-09-04 DIAGNOSIS — I4891 Unspecified atrial fibrillation: Secondary | ICD-10-CM | POA: Insufficient documentation

## 2014-09-04 DIAGNOSIS — I319 Disease of pericardium, unspecified: Secondary | ICD-10-CM

## 2014-09-04 DIAGNOSIS — I313 Pericardial effusion (noninflammatory): Secondary | ICD-10-CM

## 2014-09-04 DIAGNOSIS — I5022 Chronic systolic (congestive) heart failure: Secondary | ICD-10-CM

## 2014-09-04 DIAGNOSIS — I255 Ischemic cardiomyopathy: Secondary | ICD-10-CM

## 2014-09-04 DIAGNOSIS — I3139 Other pericardial effusion (noninflammatory): Secondary | ICD-10-CM

## 2014-09-04 DIAGNOSIS — R609 Edema, unspecified: Secondary | ICD-10-CM

## 2014-09-04 DIAGNOSIS — N189 Chronic kidney disease, unspecified: Secondary | ICD-10-CM

## 2014-09-04 MED ORDER — ATORVASTATIN CALCIUM 20 MG PO TABS: 20.0000 mg | ORAL_TABLET | Freq: Every day | ORAL | Status: AC

## 2014-09-04 MED ORDER — FUROSEMIDE 40 MG PO TABS: ORAL_TABLET | ORAL | Status: AC

## 2014-09-04 MED ORDER — NITROGLYCERIN 0.4 MG SL SUBL: 0.4000 mg | SUBLINGUAL_TABLET | SUBLINGUAL | Status: AC | PRN

## 2014-09-04 MED ORDER — CARVEDILOL 6.25 MG PO TABS: 6.2500 mg | ORAL_TABLET | Freq: Two times a day (BID) | ORAL | Status: AC

## 2014-09-04 MED ORDER — HYDRALAZINE HCL 25 MG PO TABS: 25.0000 mg | ORAL_TABLET | Freq: Three times a day (TID) | ORAL | Status: DC

## 2014-09-04 MED ORDER — ISOSORBIDE MONONITRATE ER 30 MG PO TB24: 30.0000 mg | ORAL_TABLET | Freq: Every day | ORAL | Status: AC

## 2014-09-04 MED ORDER — AMLODIPINE BESYLATE 10 MG PO TABS: 10.0000 mg | ORAL_TABLET | Freq: Every day | ORAL | Status: AC

## 2014-09-04 NOTE — Progress Notes (Signed)
Cardiology Office Note   Date:  09/04/2014   ID:  Monica Rhodes, DOB 1927/10/31, MRN [id number]  PCP:  Elyn Peers, MD  Cardiologist:  Dola Argyle, MD   Chief Complaint  Patient presents with  . Appointment    Follow-up CHF      History of Present Illness: Monica Rhodes is a 81 y.o. female who presents today to follow up CHF. She has left ventricular dysfunction and a persistent pericardial effusion. She was recently hospitalized and her meds were adjusted. She was seen back in the office by Mr. Kathlen Mody on August 17, 2014. Her meds were adjusted further. She has called in on several occasions with her family stating that she had significant edema. She had continuous weeping from her edema from her right leg. She's not having any marked shortness of breath. A repeat two-dimensional echo was done. I have personally reviewed her echo is from 2012, and several echoes in March of 2016. The patient has had a moderate posterior pericardial effusion since mid February, 2016. She has left ventricular dysfunction. We had been aware of this. The effusion was primarily posterior. There was no significant hemodynamic effect.  Today she is here with her son. She still has significant edema. There is significant weeping from the right lower leg. There is no infection in the leg. The patient's son has a very good understanding of the fact that she actually responds well when we increase her diuretic dosing.    Past Medical History  Diagnosis Date  . Hypertension   . UTI (urinary tract infection)   . SBO (small bowel obstruction)     Recurrent, prolonged hospitalization November, 2012  . Protein calorie malnutrition   . Hyperlipidemia   . DM (diabetes mellitus)   . COPD (chronic obstructive pulmonary disease)   . PVD (peripheral vascular disease)   . Colon cancer   . Bradycardia   . Tobacco abuse   . CAD (coronary artery disease)     Non-STEMI November, 2012,, Cardiac catheterization was  attempted, despite significant efforts catheters could not be progressed to the coronary arteries ., Therefore medical therapy  . Carotid artery disease     Doppler  April 16, 2011,, total occlusion LICA, 28-78% R. ICA  . PAD (peripheral artery disease)     Suggestions significant stenosis distal aorta by CT angiogram  November, 2012  . Ejection fraction     EF 60%, echo, November, 2012,  . Mitral regurgitation     Moderate, echo, November, 2012  . Right ventricular dysfunction     Moderate, echo, November, 2012  . Edema     December, 2012  . Rash     Rash  probably related to hydralazine  December, 2012  . Hx of echocardiogram     Echo 3/16:  EF 35-40%, ant-lat HK, mod MR, mod LAE, mod to large pericardial effusion with no evidence of tamponade (unchanged from prior echo)    Past Surgical History  Procedure Laterality Date  . Colostomy    . Abdominoperineal proctocolectomy    . Left heart catheterization with coronary angiogram N/A 04/24/2011    Procedure: LEFT HEART CATHETERIZATION WITH CORONARY ANGIOGRAM;  Surgeon: Josue Hector, MD;  Location: [facility] CATH LAB;  Service: Cardiovascular;  Laterality: N/A;    Patient Active Problem List   Diagnosis Date Noted  . Chronic systolic CHF (congestive heart failure) 09/04/2014  . Atrial fibrillation and flutter 09/04/2014  . Ischemic cardiomyopathy 08/16/2014  . CKD (chronic kidney disease)  08/16/2014  . Pericardial effusion 08/05/2014  . Malnutrition of moderate degree 08/04/2014  . Carotid artery disease   . Edema   . Rash   . CAD (coronary artery disease)   . Hyperlipidemia   . DM (diabetes mellitus)   . COPD (chronic obstructive pulmonary disease)   . Tobacco abuse   . PAD (peripheral artery disease)   . Ejection fraction   . Mitral regurgitation   . Right ventricular dysfunction   . Protein calorie malnutrition 04/20/2011  . SBO (small bowel obstruction) 04/16/2011  . UTI (urinary tract infection) 04/16/2011  . History  of colectomy   . Hypertension       Current Outpatient Prescriptions  Medication Sig Dispense Refill  . amLODipine (NORVASC) 10 MG tablet Take 1 tablet (10 mg total) by mouth daily. 90 tablet 1  . aspirin EC 81 MG EC tablet Take 1 tablet (81 mg total) by mouth daily.    Marland Kitchen atorvastatin (LIPITOR) 20 MG tablet Take 1 tablet (20 mg total) by mouth daily at 6 PM. 90 tablet 1  . carvedilol (COREG) 6.25 MG tablet Take 1 tablet (6.25 mg total) by mouth 2 (two) times daily with a meal. 180 tablet 1  . feeding supplement, ENSURE COMPLETE, (ENSURE COMPLETE) LIQD Take 237 mLs by mouth 2 (two) times daily between meals. 60 Bottle 0  . furosemide (LASIX) 40 MG tablet Take 40 mg BID until weight is 121 lbs then take 40 mg daily 180 tablet 1  . hydrALAZINE (APRESOLINE) 25 MG tablet Take 1 tablet (25 mg total) by mouth 3 (three) times daily. 90 tablet 1  . isosorbide mononitrate (IMDUR) 30 MG 24 hr tablet Take 1 tablet (30 mg total) by mouth daily. 90 tablet 1  . LINZESS 145 MCG CAPS capsule Take 145 mcg by mouth daily.  4  . nitroGLYCERIN (NITROSTAT) 0.4 MG SL tablet Place 1 tablet (0.4 mg total) under the tongue every 5 (five) minutes as needed. For chest pain 25 tablet 3   No current facility-administered medications for this visit.    Allergies:   Promethazine    Social History:  The patient  reports that she quit smoking about 2 months ago. Her smoking use included Cigarettes. She smoked 0.50 packs per day. She does not have any smokeless tobacco history on file. She reports that she does not drink alcohol or use illicit drugs.   Family History:  The patient's family history includes Coronary artery disease in an other family member; Heart attack in her mother; Hypertension in her father, mother, and sister. There is no history of Stroke.    ROS:  Please see the history of present illness.   The patient denies fever, chills, headache, sweats, rash, change in vision, change in hearing, chest pain,  cough, nausea or vomiting, urinary symptoms. All other systems are reviewed and are negative.     PHYSICAL EXAM: VS:  BP 112/46 mmHg  Pulse 44  Ht 5\' 1"  (1.549 m)  Wt 126 lb 12.8 oz (57.516 kg)  BMI 23.97 kg/m2 , The patient is frail. She is in a wheelchair. She is here with her son. Head is atraumatic. Sclera and conjunctiva are normal. There is no jugulovenous distention. Lungs reveal a few scattered rhonchi. She is wearing her home O2. Cardiac exam reveals S1 and S2. The abdomen is soft. She has 2+ bilateral peripheral edema. There is a serous drainage from her right leg. There is no significant wound. She just drains continuously from  a small spot in her skin. The drainage is significant. Neurologic is grossly intact. She has kyphosis of the spine.  EKG:   EKG is not done today.   Recent Labs: 08/02/2014: B Natriuretic Peptide 2079.5* 08/08/2014: Hemoglobin 11.5*; Platelets 219 08/11/2014: ALT 15 08/17/2014: BUN 46*; Creatinine 1.07; Potassium 5.2*; Sodium 138    Lipid Panel    Component Value Date/Time   CHOL 184 04/17/2011 0538   TRIG 216* 04/17/2011 0538      Wt Readings from Last 3 Encounters:  09/04/14 126 lb 12.8 oz (57.516 kg)  08/17/14 118 lb (53.524 kg)  08/11/14 117 lb 1 oz (53.1 kg)      Current medicines are reviewed  The patient's son understands her medications.     ASSESSMENT AND PLAN:

## 2014-09-04 NOTE — Patient Instructions (Addendum)
Your physician has recommended you make the following change in your medication: take 40 mg of Lasix (Furosemide) twice daily until weight is down to 121 lbs. Then take 40 mg daily.  Please elevate your legs as often as possible and limit your salt intake. Please see hand outs given to you at todays office visit.  Your physician recommends that you schedule a follow-up appointment in: 3 to 4 weeks

## 2014-09-04 NOTE — Assessment & Plan Note (Signed)
Patient has history of coronary disease. She had a non-STEMI in November, 2012. Catheterization attempt was unsuccessful. Catheters could not be progressed to the coronary arteries. It was felt that further attempts should not be made. It was noted that the patient had an ST elevation MI, the left radial could be tried. Coronaries are stable at this time.

## 2014-09-04 NOTE — Assessment & Plan Note (Signed)
Patient was recently noted to have atrial fib in atrial flutter. Her rate is controlled. She is not a candidate for anticoagulation.

## 2014-09-04 NOTE — Assessment & Plan Note (Signed)
The patient is elderly and has some renal dysfunction. Fortunately her most recent creatinine remains in the range of 1.1. This is okay for her.

## 2014-09-04 NOTE — Assessment & Plan Note (Signed)
The patient does have a significant pericardial effusion. However it has been chronic. I'm hopeful that we can diurese her well and eventually have a decrease in her pericardial effusion. There is suggestion of pleural fluid also by echo.

## 2014-09-04 NOTE — Assessment & Plan Note (Signed)
Her edema is very significant. I feel that her legs should not be wrapped. She needs to have free drainage of the serous fluid that drains from her leg.

## 2014-09-04 NOTE — Assessment & Plan Note (Addendum)
Patient has chronic systolic CHF. She has significant edema. We have been watching her renal function carefully. Her creatinine has remained stable. Her BUN has been elevated mildly. The patient's son and I agree that we can push her diuretics higher. We decided that we would pick 121 pounds as a dry weight team for at this time. Her weight at home is in the range of 124. She will take Lasix 40 twice a day if her weight is above 121 pounds. If her weight is 121 pounds or lower, she will take 40 mg once a day. The patient's son will report to [place] about her progress. It may turn out that 121 pounds is not her optimal dry weight. We also talked a lot about keeping her feet elevated and limiting her overall salt and fluid intake.  As part of today's evaluation I did an extensive review of her records. I reviewed current and old echoes. In addition I spent greater than 25 minutes of total care directly with her. More than half of this time was spent with discussing her overall situation concerning her fluids and her weeping leg and her medications.

## 2014-09-11 ENCOUNTER — Telehealth: Payer: Self-pay | Admitting: Cardiology

## 2014-09-11 ENCOUNTER — Other Ambulatory Visit: Payer: Self-pay | Admitting: *Deleted

## 2014-09-11 NOTE — Telephone Encounter (Signed)
Spoke with pt's son, Monica Rhodes, [place] on file. Son states that his mother's weights are as follows for the week: Sunday- 123 lbs Monday- 125.8 lbs Tuesday- 124.2 lbs Wednesday- 124.4 lbs Thursday- 121.4 lbs Friday- 124.9 lbs Son wanted to make Dr. Ron Parker aware of wts due to weight gain from Thurs-Fri. Informed son that Dr. Ron Parker is out of the office today but that I would route this information to him for review and advisement. Son verbalized understanding and was in agreement with this plan.

## 2014-09-11 NOTE — Patient Outreach (Signed)
Final transition of care call:   Spoke with daughter [name] ([place], on [facility] consent form).   Daughter states pt is doing much better, f/u with Dr. Ron Parker.   Daughter states MD increased her Lasix, working, lost a few pounds in one day, not gaining more.  Daughter states BP is good.   Daughter reports pt is compliant with meds, low salt diet.   Discussed with daughter this is pt's last transition of care call, discussed again  [facility] services, receiving monthly home visits from a nurse.  Daughter states she did not talk to her family about [facility] services but does not feel pt needs it.  Daughter states [place] RN and HHPT are coming. Daughter states pt is taking care of herself, got in trouble  with new medicine she was given, waited and got worse.    Plan to close pt's case as no further intervention is needed.         Zara Chess.   [facility]  [phone]

## 2014-09-11 NOTE — Telephone Encounter (Signed)
New Message  Pt son called states the Pt is really had a  Weight gain overnight. Pt is now 3 LBS. Please call back to discuss

## 2014-09-14 NOTE — Telephone Encounter (Signed)
Fritz Pickerel, the pts son, states that the pt was concerned about her weight on 4/1 and asked him to call office to report her weight. He states that since then her weight is back down. Per Dr Bartolo Darter is advised that the pt should continue to take Furosemide 40 mg bid until weight is down to 121 lbs. Fritz Pickerel verbalized understanding.

## 2014-09-30 ENCOUNTER — Ambulatory Visit (INDEPENDENT_AMBULATORY_CARE_PROVIDER_SITE_OTHER): Payer: Medicare Other | Admitting: Cardiology

## 2014-09-30 ENCOUNTER — Encounter: Payer: Self-pay | Admitting: Cardiology

## 2014-09-30 ENCOUNTER — Ambulatory Visit: Payer: Medicare Other | Admitting: Cardiology

## 2014-09-30 VITALS — BP 112/50 | HR 64 | Ht 61.0 in | Wt 124.0 lb

## 2014-09-30 DIAGNOSIS — I313 Pericardial effusion (noninflammatory): Secondary | ICD-10-CM

## 2014-09-30 DIAGNOSIS — I319 Disease of pericardium, unspecified: Secondary | ICD-10-CM

## 2014-09-30 DIAGNOSIS — I4892 Unspecified atrial flutter: Secondary | ICD-10-CM

## 2014-09-30 DIAGNOSIS — I5022 Chronic systolic (congestive) heart failure: Secondary | ICD-10-CM

## 2014-09-30 DIAGNOSIS — N183 Chronic kidney disease, stage 3 unspecified: Secondary | ICD-10-CM

## 2014-09-30 DIAGNOSIS — I4891 Unspecified atrial fibrillation: Secondary | ICD-10-CM

## 2014-09-30 DIAGNOSIS — I255 Ischemic cardiomyopathy: Secondary | ICD-10-CM | POA: Diagnosis not present

## 2014-09-30 DIAGNOSIS — I3139 Other pericardial effusion (noninflammatory): Secondary | ICD-10-CM

## 2014-09-30 LAB — BASIC METABOLIC PANEL
BUN: 53 mg/dL — ABNORMAL HIGH (ref 6–23)
CO2: 36 meq/L — AB (ref 19–32)
Calcium: 9 mg/dL (ref 8.4–10.5)
Chloride: 101 mEq/L (ref 96–112)
Creatinine, Ser: 1.24 mg/dL — ABNORMAL HIGH (ref 0.40–1.20)
GFR: 43.71 mL/min — ABNORMAL LOW (ref >60.00)
Glucose, Bld: 112 mg/dL — ABNORMAL HIGH (ref 70–99)
Potassium: 4 mEq/L (ref 3.5–5.1)
Sodium: 141 mEq/L (ref 135–145)

## 2014-09-30 NOTE — Assessment & Plan Note (Addendum)
She is not having any significant symptoms. She still has significant edema. However this is actually greatly improved. I feel that we should still pushed for a home weight in the range of 120-121. Her son understands that he will continue her twice a day dosing of diuretics, she drops below this range. Chemistry will be checked today. We discussed again keeping her feet elevated. I think it will not be wise to try to push for complete resolution of her edema.

## 2014-09-30 NOTE — Assessment & Plan Note (Signed)
Rate is controlled. No change in therapy. She has not candidate for anticoagulation.

## 2014-09-30 NOTE — Progress Notes (Signed)
Cardiology Office Note   Date:  09/30/2014   ID:  Monica Rhodes, DOB [birth year], MRN [id number]  PCP:  Elyn Peers, MD  Cardiologist:  Dola Argyle, MD   Chief Complaint  Patient presents with  . Appointment    Follow-up CHF      History of Present Illness: Monica Rhodes is a 81 y.o. female who presents today to follow-up her significant edema. I saw her last September 04, 2014 with a very complete note. We increased her diuretic with the plan to use 40 twice a day until her weight gets down to 121 pounds at home. Her son is very good in helping with the meds. She did reach 121, and he wanted to see it at that level for a period of time before reducing her dose. This was wise on his part as she did have a slight increase since then. We had a careful discussion today about keeping her feet elevated in her recliner during the day as much as possible. Overall her legs are definitely improved without active weeping of fluid. She still has edema.    Past Medical History  Diagnosis Date  . Hypertension   . UTI (urinary tract infection)   . SBO (small bowel obstruction)     Recurrent, prolonged hospitalization November, 2012  . Protein calorie malnutrition   . Hyperlipidemia   . DM (diabetes mellitus)   . COPD (chronic obstructive pulmonary disease)   . PVD (peripheral vascular disease)   . Colon cancer   . Bradycardia   . Tobacco abuse   . CAD (coronary artery disease)     Non-STEMI November, 2012,, Cardiac catheterization was attempted, despite significant efforts catheters could not be progressed to the coronary arteries ., Therefore medical therapy  . Carotid artery disease     Doppler  April 16, 2011,, total occlusion LICA, 29-52% R. ICA  . PAD (peripheral artery disease)     Suggestions significant stenosis distal aorta by CT angiogram  November, 2012  . Ejection fraction     EF 60%, echo, November, 2012,  . Mitral regurgitation     Moderate, echo, November, 2012  .  Right ventricular dysfunction     Moderate, echo, November, 2012  . Edema     December, 2012  . Rash     Rash  probably related to hydralazine  December, 2012  . Hx of echocardiogram     Echo 3/16:  EF 35-40%, ant-lat HK, mod MR, mod LAE, mod to large pericardial effusion with no evidence of tamponade (unchanged from prior echo)    Past Surgical History  Procedure Laterality Date  . Colostomy    . Abdominoperineal proctocolectomy    . Left heart catheterization with coronary angiogram N/A 04/24/2011    Procedure: LEFT HEART CATHETERIZATION WITH CORONARY ANGIOGRAM;  Surgeon: Josue Hector, MD;  Location: [facility] CATH LAB;  Service: Cardiovascular;  Laterality: N/A;    Patient Active Problem List   Diagnosis Date Noted  . Chronic systolic CHF (congestive heart failure) 09/04/2014  . Atrial fibrillation and flutter 09/04/2014  . Ischemic cardiomyopathy 08/16/2014  . CKD (chronic kidney disease) 08/16/2014  . Pericardial effusion 08/05/2014  . Malnutrition of moderate degree 08/04/2014  . Carotid artery disease   . Edema   . Rash   . CAD (coronary artery disease)   . Hyperlipidemia   . DM (diabetes mellitus)   . COPD (chronic obstructive pulmonary disease)   . Tobacco abuse   . PAD (peripheral artery  disease)   . Ejection fraction   . Mitral regurgitation   . Right ventricular dysfunction   . Protein calorie malnutrition 04/20/2011  . SBO (small bowel obstruction) 04/16/2011  . UTI (urinary tract infection) 04/16/2011  . History of colectomy   . Hypertension       Current Outpatient Prescriptions  Medication Sig Dispense Refill  . amLODipine (NORVASC) 10 MG tablet Take 1 tablet (10 mg total) by mouth daily. 90 tablet 1  . aspirin EC 81 MG EC tablet Take 1 tablet (81 mg total) by mouth daily.    Marland Kitchen atorvastatin (LIPITOR) 20 MG tablet Take 1 tablet (20 mg total) by mouth daily at 6 PM. 90 tablet 1  . carvedilol (COREG) 6.25 MG tablet Take 1 tablet (6.25 mg total) by mouth 2  (two) times daily with a meal. 180 tablet 1  . feeding supplement, ENSURE COMPLETE, (ENSURE COMPLETE) LIQD Take 237 mLs by mouth 2 (two) times daily between meals. 60 Bottle 0  . furosemide (LASIX) 40 MG tablet Take 40 mg BID until weight is 121 lbs then take 40 mg daily 180 tablet 1  . hydrALAZINE (APRESOLINE) 25 MG tablet Take 1 tablet (25 mg total) by mouth 3 (three) times daily. 90 tablet 1  . isosorbide mononitrate (IMDUR) 30 MG 24 hr tablet Take 1 tablet (30 mg total) by mouth daily. 90 tablet 1  . LINZESS 145 MCG CAPS capsule Take 145 mcg by mouth as needed (take as needed for IBS).   4  . nitroGLYCERIN (NITROSTAT) 0.4 MG SL tablet Place 1 tablet (0.4 mg total) under the tongue every 5 (five) minutes as needed. For chest pain 25 tablet 3   No current facility-administered medications for this visit.    Allergies:   Promethazine    Social History:  The patient  reports that she quit smoking about 3 months ago. Her smoking use included Cigarettes. She smoked 0.50 packs per day. She does not have any smokeless tobacco history on file. She reports that she does not drink alcohol or use illicit drugs.   Family History:  The patient's family history includes Coronary artery disease in an other family member; Heart attack in her mother; Hypertension in her father, mother, and sister. There is no history of Stroke.    ROS:  Please see the history of present illness.     Patient denies fever, chills, headache, sweats, rash, change in vision, change in hearing, chest pain, cough, nausea or vomiting, urinary symptoms. All other systems are reviewed and are negative.   PHYSICAL EXAM: VS:  BP 112/50 mmHg  Pulse 64  Ht 5\' 1"  (1.549 m)  Wt 124 lb (56.246 kg)  BMI 23.44 kg/m2 , The patient is here with her son. She is frail. She is in a wheelchair. Head is atraumatic. Sclera and conjunctiva are normal. There is no jugulovenous distention. Lungs are clear. Respiratory effort is nonlabored. Cardiac  exam reveals an irregularly irregular rhythm. The rate is controlled. Abdomen is soft. The patient has 1-2 + edema bilaterally. However this is definitely improved since the last visit. The skin is no longer tense. She is not having the continuous weeping from her leg that she had before. She has significant kyphosis of the spine. She has some mild ecchymoses on her legs. Neurologic exam was not done.  EKG:   EKG is not done today.   Recent Labs: 08/02/2014: B Natriuretic Peptide 2079.5* 08/08/2014: Hemoglobin 11.5*; Platelets 219 08/11/2014: ALT 15 08/17/2014: BUN  46*; Creatinine 1.07; Potassium 5.2*; Sodium 138    Lipid Panel    Component Value Date/Time   CHOL 184 04/17/2011 0538   TRIG 216* 04/17/2011 0538      Wt Readings from Last 3 Encounters:  09/30/14 124 lb (56.246 kg)  09/04/14 126 lb 12.8 oz (57.516 kg)  08/17/14 118 lb (53.524 kg)      Current medicines are reviewed  The patient's son understands her medicines well.     ASSESSMENT AND PLAN:

## 2014-09-30 NOTE — Patient Instructions (Signed)
Medication Instructions:  None  Labwork: Today (BMET)  Testing/Procedures: None  Follow-Up: Your physician recommends that you schedule a follow-up appointment in: 3 months.

## 2014-09-30 NOTE — Assessment & Plan Note (Addendum)
Chemistry will be checked today after her recent modest diuresis.Marland Kitchen

## 2014-09-30 NOTE — Assessment & Plan Note (Signed)
I will consider follow-up echo in a later date.

## 2014-10-01 ENCOUNTER — Encounter: Payer: Self-pay | Admitting: Cardiology

## 2014-10-02 ENCOUNTER — Telehealth: Payer: Self-pay | Admitting: Cardiology

## 2014-10-02 NOTE — Telephone Encounter (Signed)
The pts son, Fritz Pickerel, has been advised of the pts lab results per Dr Ron Parker. He verbalized understanding.

## 2014-10-02 NOTE — Telephone Encounter (Signed)
New message         Pt son returning Lynn's call

## 2014-10-21 ENCOUNTER — Ambulatory Visit: Payer: Medicare Other | Admitting: Cardiology

## 2014-11-02 ENCOUNTER — Other Ambulatory Visit: Payer: Self-pay | Admitting: Cardiology

## 2014-12-03 ENCOUNTER — Encounter (HOSPITAL_COMMUNITY): Payer: Self-pay | Admitting: Emergency Medicine

## 2014-12-03 ENCOUNTER — Emergency Department (HOSPITAL_COMMUNITY): Payer: Medicare Other

## 2014-12-03 ENCOUNTER — Inpatient Hospital Stay (HOSPITAL_COMMUNITY)
Admission: EM | Admit: 2014-12-03 | Discharge: 2014-12-11 | DRG: 480 | Disposition: E | Payer: Medicare Other | Attending: Internal Medicine | Admitting: Internal Medicine

## 2014-12-03 DIAGNOSIS — K7200 Acute and subacute hepatic failure without coma: Secondary | ICD-10-CM | POA: Diagnosis not present

## 2014-12-03 DIAGNOSIS — Z87891 Personal history of nicotine dependence: Secondary | ICD-10-CM

## 2014-12-03 DIAGNOSIS — Z781 Physical restraint status: Secondary | ICD-10-CM | POA: Diagnosis not present

## 2014-12-03 DIAGNOSIS — J811 Chronic pulmonary edema: Secondary | ICD-10-CM

## 2014-12-03 DIAGNOSIS — K5900 Constipation, unspecified: Secondary | ICD-10-CM | POA: Diagnosis not present

## 2014-12-03 DIAGNOSIS — N183 Chronic kidney disease, stage 3 unspecified: Secondary | ICD-10-CM | POA: Diagnosis present

## 2014-12-03 DIAGNOSIS — I4892 Unspecified atrial flutter: Secondary | ICD-10-CM | POA: Diagnosis present

## 2014-12-03 DIAGNOSIS — J9622 Acute and chronic respiratory failure with hypercapnia: Secondary | ICD-10-CM | POA: Diagnosis present

## 2014-12-03 DIAGNOSIS — E1122 Type 2 diabetes mellitus with diabetic chronic kidney disease: Secondary | ICD-10-CM | POA: Diagnosis present

## 2014-12-03 DIAGNOSIS — S72141A Displaced intertrochanteric fracture of right femur, initial encounter for closed fracture: Secondary | ICD-10-CM | POA: Diagnosis not present

## 2014-12-03 DIAGNOSIS — E876 Hypokalemia: Secondary | ICD-10-CM | POA: Diagnosis not present

## 2014-12-03 DIAGNOSIS — R34 Anuria and oliguria: Secondary | ICD-10-CM | POA: Diagnosis not present

## 2014-12-03 DIAGNOSIS — I252 Old myocardial infarction: Secondary | ICD-10-CM | POA: Diagnosis not present

## 2014-12-03 DIAGNOSIS — I493 Ventricular premature depolarization: Secondary | ICD-10-CM | POA: Diagnosis present

## 2014-12-03 DIAGNOSIS — Z885 Allergy status to narcotic agent status: Secondary | ICD-10-CM | POA: Diagnosis not present

## 2014-12-03 DIAGNOSIS — I482 Chronic atrial fibrillation: Secondary | ICD-10-CM | POA: Diagnosis present

## 2014-12-03 DIAGNOSIS — Z933 Colostomy status: Secondary | ICD-10-CM | POA: Diagnosis not present

## 2014-12-03 DIAGNOSIS — I313 Pericardial effusion (noninflammatory): Secondary | ICD-10-CM | POA: Diagnosis present

## 2014-12-03 DIAGNOSIS — Z7982 Long term (current) use of aspirin: Secondary | ICD-10-CM

## 2014-12-03 DIAGNOSIS — Z419 Encounter for procedure for purposes other than remedying health state, unspecified: Secondary | ICD-10-CM

## 2014-12-03 DIAGNOSIS — R410 Disorientation, unspecified: Secondary | ICD-10-CM | POA: Diagnosis not present

## 2014-12-03 DIAGNOSIS — W19XXXA Unspecified fall, initial encounter: Secondary | ICD-10-CM

## 2014-12-03 DIAGNOSIS — G9340 Encephalopathy, unspecified: Secondary | ICD-10-CM | POA: Diagnosis not present

## 2014-12-03 DIAGNOSIS — I5022 Chronic systolic (congestive) heart failure: Secondary | ICD-10-CM

## 2014-12-03 DIAGNOSIS — Z85038 Personal history of other malignant neoplasm of large intestine: Secondary | ICD-10-CM | POA: Diagnosis not present

## 2014-12-03 DIAGNOSIS — I340 Nonrheumatic mitral (valve) insufficiency: Secondary | ICD-10-CM | POA: Diagnosis present

## 2014-12-03 DIAGNOSIS — S72141D Displaced intertrochanteric fracture of right femur, subsequent encounter for closed fracture with routine healing: Secondary | ICD-10-CM | POA: Diagnosis not present

## 2014-12-03 DIAGNOSIS — Z79899 Other long term (current) drug therapy: Secondary | ICD-10-CM | POA: Diagnosis not present

## 2014-12-03 DIAGNOSIS — E872 Acidosis: Secondary | ICD-10-CM | POA: Diagnosis present

## 2014-12-03 DIAGNOSIS — I4891 Unspecified atrial fibrillation: Secondary | ICD-10-CM | POA: Diagnosis present

## 2014-12-03 DIAGNOSIS — E43 Unspecified severe protein-calorie malnutrition: Secondary | ICD-10-CM | POA: Diagnosis present

## 2014-12-03 DIAGNOSIS — Y92000 Kitchen of unspecified non-institutional (private) residence as  the place of occurrence of the external cause: Secondary | ICD-10-CM | POA: Diagnosis not present

## 2014-12-03 DIAGNOSIS — J9602 Acute respiratory failure with hypercapnia: Secondary | ICD-10-CM

## 2014-12-03 DIAGNOSIS — E119 Type 2 diabetes mellitus without complications: Secondary | ICD-10-CM

## 2014-12-03 DIAGNOSIS — Z66 Do not resuscitate: Secondary | ICD-10-CM | POA: Diagnosis present

## 2014-12-03 DIAGNOSIS — Z4659 Encounter for fitting and adjustment of other gastrointestinal appliance and device: Secondary | ICD-10-CM

## 2014-12-03 DIAGNOSIS — R4182 Altered mental status, unspecified: Secondary | ICD-10-CM

## 2014-12-03 DIAGNOSIS — T148XXA Other injury of unspecified body region, initial encounter: Secondary | ICD-10-CM

## 2014-12-03 DIAGNOSIS — D649 Anemia, unspecified: Secondary | ICD-10-CM | POA: Diagnosis present

## 2014-12-03 DIAGNOSIS — I959 Hypotension, unspecified: Secondary | ICD-10-CM | POA: Diagnosis not present

## 2014-12-03 DIAGNOSIS — Z888 Allergy status to other drugs, medicaments and biological substances status: Secondary | ICD-10-CM

## 2014-12-03 DIAGNOSIS — J9621 Acute and chronic respiratory failure with hypoxia: Secondary | ICD-10-CM | POA: Diagnosis not present

## 2014-12-03 DIAGNOSIS — I739 Peripheral vascular disease, unspecified: Secondary | ICD-10-CM | POA: Diagnosis present

## 2014-12-03 DIAGNOSIS — L89152 Pressure ulcer of sacral region, stage 2: Secondary | ICD-10-CM | POA: Diagnosis present

## 2014-12-03 DIAGNOSIS — N179 Acute kidney failure, unspecified: Secondary | ICD-10-CM | POA: Diagnosis not present

## 2014-12-03 DIAGNOSIS — I129 Hypertensive chronic kidney disease with stage 1 through stage 4 chronic kidney disease, or unspecified chronic kidney disease: Secondary | ICD-10-CM | POA: Diagnosis present

## 2014-12-03 DIAGNOSIS — R451 Restlessness and agitation: Secondary | ICD-10-CM | POA: Diagnosis not present

## 2014-12-03 DIAGNOSIS — I517 Cardiomegaly: Secondary | ICD-10-CM | POA: Diagnosis present

## 2014-12-03 DIAGNOSIS — I5023 Acute on chronic systolic (congestive) heart failure: Secondary | ICD-10-CM | POA: Diagnosis not present

## 2014-12-03 DIAGNOSIS — I5043 Acute on chronic combined systolic (congestive) and diastolic (congestive) heart failure: Secondary | ICD-10-CM | POA: Diagnosis present

## 2014-12-03 DIAGNOSIS — J449 Chronic obstructive pulmonary disease, unspecified: Secondary | ICD-10-CM | POA: Diagnosis not present

## 2014-12-03 DIAGNOSIS — E785 Hyperlipidemia, unspecified: Secondary | ICD-10-CM | POA: Diagnosis present

## 2014-12-03 DIAGNOSIS — J438 Other emphysema: Secondary | ICD-10-CM | POA: Diagnosis not present

## 2014-12-03 DIAGNOSIS — R0602 Shortness of breath: Secondary | ICD-10-CM | POA: Diagnosis present

## 2014-12-03 DIAGNOSIS — I2510 Atherosclerotic heart disease of native coronary artery without angina pectoris: Secondary | ICD-10-CM | POA: Diagnosis present

## 2014-12-03 DIAGNOSIS — W1830XA Fall on same level, unspecified, initial encounter: Secondary | ICD-10-CM | POA: Diagnosis present

## 2014-12-03 DIAGNOSIS — J9601 Acute respiratory failure with hypoxia: Secondary | ICD-10-CM

## 2014-12-03 DIAGNOSIS — Z01810 Encounter for preprocedural cardiovascular examination: Secondary | ICD-10-CM

## 2014-12-03 DIAGNOSIS — IMO0002 Reserved for concepts with insufficient information to code with codable children: Secondary | ICD-10-CM | POA: Diagnosis present

## 2014-12-03 DIAGNOSIS — T790XXA Air embolism (traumatic), initial encounter: Secondary | ICD-10-CM

## 2014-12-03 DIAGNOSIS — I5041 Acute combined systolic (congestive) and diastolic (congestive) heart failure: Secondary | ICD-10-CM | POA: Diagnosis not present

## 2014-12-03 DIAGNOSIS — R5383 Other fatigue: Secondary | ICD-10-CM

## 2014-12-03 DIAGNOSIS — L8990 Pressure ulcer of unspecified site, unspecified stage: Secondary | ICD-10-CM | POA: Diagnosis present

## 2014-12-03 DIAGNOSIS — R69 Illness, unspecified: Secondary | ICD-10-CM | POA: Diagnosis not present

## 2014-12-03 DIAGNOSIS — R0682 Tachypnea, not elsewhere classified: Secondary | ICD-10-CM

## 2014-12-03 DIAGNOSIS — Z515 Encounter for palliative care: Secondary | ICD-10-CM | POA: Diagnosis not present

## 2014-12-03 DIAGNOSIS — S72001A Fracture of unspecified part of neck of right femur, initial encounter for closed fracture: Secondary | ICD-10-CM

## 2014-12-03 DIAGNOSIS — I10 Essential (primary) hypertension: Secondary | ICD-10-CM | POA: Diagnosis present

## 2014-12-03 HISTORY — DX: Other specified postprocedural states: Z98.890

## 2014-12-03 HISTORY — DX: Heart failure, unspecified: I50.9

## 2014-12-03 HISTORY — DX: Other specified postprocedural states: R11.2

## 2014-12-03 LAB — BASIC METABOLIC PANEL
ANION GAP: 10 (ref 5–15)
BUN: 53 mg/dL — AB (ref 6–20)
CHLORIDE: 96 mmol/L — AB (ref 101–111)
CO2: 37 mmol/L — AB (ref 22–32)
Calcium: 8.5 mg/dL — ABNORMAL LOW (ref 8.9–10.3)
Creatinine, Ser: 1.31 mg/dL — ABNORMAL HIGH (ref 0.44–1.00)
GFR calc Af Amer: 42 mL/min — ABNORMAL LOW (ref >60)
GFR calc non Af Amer: 36 mL/min — ABNORMAL LOW (ref >60)
Glucose, Bld: 173 mg/dL — ABNORMAL HIGH (ref 65–99)
POTASSIUM: 3.9 mmol/L (ref 3.5–5.1)
Sodium: 143 mmol/L (ref 135–145)

## 2014-12-03 LAB — URINALYSIS, ROUTINE W REFLEX MICROSCOPIC
Bilirubin Urine: NEGATIVE
GLUCOSE, UA: NEGATIVE mg/dL
Hgb urine dipstick: NEGATIVE
KETONES UR: NEGATIVE mg/dL
LEUKOCYTES UA: NEGATIVE
Nitrite: NEGATIVE
Protein, ur: 100 mg/dL — AB
Specific Gravity, Urine: 1.013 (ref 1.005–1.030)
Urobilinogen, UA: 0.2 mg/dL (ref 0.0–1.0)
pH: 5.5 (ref 5.0–8.0)

## 2014-12-03 LAB — CBC WITH DIFFERENTIAL/PLATELET
BASOS ABS: 0 10*3/uL (ref 0.0–0.1)
Basophils Relative: 0 % (ref 0–1)
EOS ABS: 0.1 10*3/uL (ref 0.0–0.7)
EOS PCT: 1 % (ref 0–5)
HCT: 32.7 % — ABNORMAL LOW (ref 36.0–46.0)
Hemoglobin: 9.6 g/dL — ABNORMAL LOW (ref 12.0–15.0)
LYMPHS PCT: 16 % (ref 12–46)
Lymphs Abs: 1.1 10*3/uL (ref 0.7–4.0)
MCH: 27.2 pg (ref 26.0–34.0)
MCHC: 29.4 g/dL — AB (ref 30.0–36.0)
MCV: 92.6 fL (ref 78.0–100.0)
Monocytes Absolute: 0.8 10*3/uL (ref 0.1–1.0)
Monocytes Relative: 11 % (ref 3–12)
Neutro Abs: 5.3 10*3/uL (ref 1.7–7.7)
Neutrophils Relative %: 72 % (ref 43–77)
PLATELETS: 168 10*3/uL (ref 150–400)
RBC: 3.53 MIL/uL — ABNORMAL LOW (ref 3.87–5.11)
RDW: 13.8 % (ref 11.5–15.5)
WBC: 7.3 10*3/uL (ref 4.0–10.5)

## 2014-12-03 LAB — ABO/RH: ABO/RH(D): O POS

## 2014-12-03 LAB — BRAIN NATRIURETIC PEPTIDE: B NATRIURETIC PEPTIDE 5: 766.5 pg/mL — AB (ref 0.0–100.0)

## 2014-12-03 LAB — PROTIME-INR
INR: 1.02 (ref 0.00–1.49)
Prothrombin Time: 13.6 seconds (ref 11.6–15.2)

## 2014-12-03 LAB — TYPE AND SCREEN
ABO/RH(D): O POS
Antibody Screen: NEGATIVE

## 2014-12-03 LAB — URINE MICROSCOPIC-ADD ON

## 2014-12-03 MED ORDER — DILTIAZEM LOAD VIA INFUSION
15.0000 mg | Freq: Once | INTRAVENOUS | Status: DC
  Filled 2014-12-03: qty 15

## 2014-12-03 MED ORDER — FUROSEMIDE 10 MG/ML IJ SOLN
40.0000 mg | Freq: Once | INTRAMUSCULAR | Status: AC
  Administered 2014-12-03: 40 mg via INTRAVENOUS
  Filled 2014-12-03: qty 4

## 2014-12-03 MED ORDER — FENTANYL CITRATE (PF) 100 MCG/2ML IJ SOLN
50.0000 ug | INTRAMUSCULAR | Status: DC | PRN
  Administered 2014-12-03: 50 ug via INTRAVENOUS

## 2014-12-03 MED ORDER — SODIUM CHLORIDE 0.9 % IV SOLN
1000.0000 mL | INTRAVENOUS | Status: DC
  Administered 2014-12-03: 1000 mL via INTRAVENOUS

## 2014-12-03 MED ORDER — LABETALOL HCL 5 MG/ML IV SOLN
10.0000 mg | Freq: Once | INTRAVENOUS | Status: AC
Start: 2014-12-03 — End: 2014-12-03
  Administered 2014-12-03: 10 mg via INTRAVENOUS
  Filled 2014-12-03: qty 4

## 2014-12-03 MED ORDER — CEFAZOLIN SODIUM-DEXTROSE 2-3 GM-% IV SOLR: 2.0000 g | INTRAVENOUS | Status: DC

## 2014-12-03 MED ORDER — FENTANYL CITRATE (PF) 100 MCG/2ML IJ SOLN
50.0000 ug | INTRAMUSCULAR | Status: AC | PRN
  Administered 2014-12-03 (×2): 50 ug via INTRAVENOUS
  Filled 2014-12-03 (×2): qty 2

## 2014-12-03 MED ORDER — ONDANSETRON HCL 4 MG/2ML IJ SOLN
4.0000 mg | Freq: Once | INTRAMUSCULAR | Status: AC
  Administered 2014-12-03: 4 mg via INTRAVENOUS
  Filled 2014-12-03: qty 2

## 2014-12-03 MED ORDER — DEXTROSE 5 % IV SOLN
5.0000 mg/h | INTRAVENOUS | Status: DC
  Filled 2014-12-03: qty 100

## 2014-12-03 NOTE — H&P (Signed)
[facility] History and Physical  Monica Rhodes TSV:[id number] DOB: [birth year] DOA: 11/11/2014  Referring physician: Theodoro Grist, PA PCP: Elyn Peers, MD   Chief Complaint: Hip Pain  HPI: Monica Rhodes is a 81 y.o. female with history of CHF CAD COPD DM HTN Atrial Fibrillation presents with hip pain. Patient apparently took a fall while she was walking with her walker in the kitchen. She did not lose consciousness. She did not have any chest pain and she did not have any seizure activity. She landed on her right side and now has a Right intertrochanteric fracture. She also has multiple medical problems including CHF with an EF of 35-40% Atrial fibrillation CAD. In the ED she was noted to be in Atrial fibrillation heart running in the 80s to low 110s initially but later became RVR in the 130s-140s.    Review of Systems:  12 point ROS performed and is unremarkable other than HPI  Past Medical History  Diagnosis Date  . Hypertension   . UTI (urinary tract infection)   . SBO (small bowel obstruction)     Recurrent, prolonged hospitalization November, 2012  . Protein calorie malnutrition   . Hyperlipidemia   . DM (diabetes mellitus)   . COPD (chronic obstructive pulmonary disease)   . PVD (peripheral vascular disease)   . Colon cancer   . Bradycardia   . Tobacco abuse   . CAD (coronary artery disease)     Non-STEMI November, 2012,, Cardiac catheterization was attempted, despite significant efforts catheters could not be progressed to the coronary arteries ., Therefore medical therapy  . Carotid artery disease     Doppler  April 16, 2011,, total occlusion LICA, 92-33% R. ICA  . PAD (peripheral artery disease)     Suggestions significant stenosis distal aorta by CT angiogram  November, 2012  . Ejection fraction     EF 60%, echo, November, 2012,  . Mitral regurgitation     Moderate, echo, November, 2012  . Right ventricular dysfunction     Moderate, echo,  November, 2012  . Edema     December, 2012  . Rash     Rash  probably related to hydralazine  December, 2012  . Hx of echocardiogram     Echo 3/16:  EF 35-40%, ant-lat HK, mod MR, mod LAE, mod to large pericardial effusion with no evidence of tamponade (unchanged from prior echo)   Past Surgical History  Procedure Laterality Date  . Colostomy    . Abdominoperineal proctocolectomy    . Left heart catheterization with coronary angiogram N/A 04/24/2011    Procedure: LEFT HEART CATHETERIZATION WITH CORONARY ANGIOGRAM;  Surgeon: Josue Hector, MD;  Location: [facility] CATH LAB;  Service: Cardiovascular;  Laterality: N/A;   Social History:  reports that she quit smoking about 5 months ago. Her smoking use included Cigarettes. She smoked 0.50 packs per day. She does not have any smokeless tobacco history on file. She reports that she does not drink alcohol or use illicit drugs.  Allergies  Allergen Reactions  . Promethazine Other (See Comments)    Unknown     Family History  Problem Relation Age of Onset  . Coronary artery disease    . Heart attack Mother   . Hypertension Mother   . Hypertension Father   . Hypertension Sister   . Stroke Neg Hx      Prior to Admission medications   Medication Sig Start Date End Date Taking? Authorizing Provider  amLODipine (NORVASC) 10  MG tablet Take 1 tablet (10 mg total) by mouth daily. 09/04/14  Yes Carlena Bjornstad, MD  aspirin EC 81 MG EC tablet Take 1 tablet (81 mg total) by mouth daily. 08/11/14  Yes Debbe Odea, MD  atorvastatin (LIPITOR) 20 MG tablet Take 1 tablet (20 mg total) by mouth daily at 6 PM. 09/04/14  Yes Carlena Bjornstad, MD  carvedilol (COREG) 6.25 MG tablet Take 1 tablet (6.25 mg total) by mouth 2 (two) times daily with a meal. 09/04/14  Yes Carlena Bjornstad, MD  feeding supplement, ENSURE COMPLETE, (ENSURE COMPLETE) LIQD Take 237 mLs by mouth 2 (two) times daily between meals. 08/11/14  Yes Debbe Odea, MD  furosemide (LASIX) 40 MG tablet Take  40 mg BID until weight is 121 lbs then take 40 mg daily Patient taking differently: Take 40 mg by mouth daily.  09/04/14  Yes Carlena Bjornstad, MD  hydrALAZINE (APRESOLINE) 25 MG tablet TAKE 1 TABLET (25 MG TOTAL) BY MOUTH 3 (THREE) TIMES DAILY. 11/02/14  Yes Carlena Bjornstad, MD  isosorbide mononitrate (IMDUR) 30 MG 24 hr tablet Take 1 tablet (30 mg total) by mouth daily. 09/04/14  Yes Carlena Bjornstad, MD  LINZESS 145 MCG CAPS capsule Take 145 mcg by mouth as needed (take as needed for IBS).  07/19/14  Yes Historical Provider, MD  nitroGLYCERIN (NITROSTAT) 0.4 MG SL tablet Place 1 tablet (0.4 mg total) under the tongue every 5 (five) minutes as needed. For chest pain 09/04/14 09/04/15 Yes Carlena Bjornstad, MD   Physical Exam: Filed Vitals:   12/09/2014 1821 11/23/2014 2032  BP: 141/51 132/70  Pulse: 60 117  Temp: 97.5 F (36.4 C)   Resp: 14 20  Height: 5\' 2"  (1.575 m)   Weight: 55.792 kg (123 lb)   SpO2: 93% 81%    Wt Readings from Last 3 Encounters:  12/02/2014 55.792 kg (123 lb)  09/30/14 56.246 kg (124 lb)  09/04/14 57.516 kg (126 lb 12.8 oz)    General:  Appears anxious Eyes: PERRL, normal lids, irises & conjunctiva ENT: hard of hearing Neck: no LAD, masses or thyromegaly Cardiovascular: IRR, no m/r/g.  Respiratory: CTA bilaterally, no w/r/r. Normal respiratory effort. Abdomen: soft, ntnd ostomy bag Skin: no rash or induration seen on limited exam Musculoskeletal: RLE shortened and externally rotated Psychiatric: appears anxious Neurologic: unable to assess.          Labs on Admission:  Basic Metabolic Panel:  Recent Labs Lab 12/01/2014 1903  NA 143  K 3.9  CL 96*  CO2 37*  GLUCOSE 173*  BUN 53*  CREATININE 1.31*  CALCIUM 8.5*   Liver Function Tests: No results for input(s): AST, ALT, ALKPHOS, BILITOT, PROT, ALBUMIN in the last 168 hours. No results for input(s): LIPASE, AMYLASE in the last 168 hours. No results for input(s): AMMONIA in the last 168 hours. CBC:  Recent  Labs Lab 12/04/2014 1903  WBC 7.3  NEUTROABS 5.3  HGB 9.6*  HCT 32.7*  MCV 92.6  PLT 168   Cardiac Enzymes: No results for input(s): CKTOTAL, CKMB, CKMBINDEX, TROPONINI in the last 168 hours.  BNP (last 3 results)  Recent Labs  08/02/14 1407  BNP 2079.5*    ProBNP (last 3 results) No results for input(s): PROBNP in the last 8760 hours.  CBG: No results for input(s): GLUCAP in the last 168 hours.  Radiological Exams on Admission: Ct Head Wo Contrast  11/27/2014   CLINICAL DATA:  Status post fall today.  EXAM:  CT HEAD WITHOUT CONTRAST  CT CERVICAL SPINE WITHOUT CONTRAST  TECHNIQUE: Multidetector CT imaging of the head and cervical spine was performed following the standard protocol without intravenous contrast. Multiplanar CT image reconstructions of the cervical spine were also generated.  COMPARISON:  Head CT scan 11/30/2005.  FINDINGS: CT HEAD FINDINGS  Chronic microvascular ischemic change is identified. There is no evidence of acute intracranial abnormality including hemorrhage, infarct, mass lesion, mass effect, midline shift or abnormal extra-axial fluid collection. The calvarium is intact. Imaged paranasal sinuses and mastoid air cells are clear.  CT CERVICAL SPINE FINDINGS  No cervical spine fracture is seen. 0.3 cm anterolisthesis C3 on C4 is due to facet arthropathy. There is marked loss of disc space height and endplate spurring at X4-1 and C6-7. Lung apices demonstrate partial visualization of layering bilateral pleural effusions.  IMPRESSION: No acute abnormality head or cervical spine.  Layering bilateral pleural effusions.  Atrophy and chronic microvascular ischemic change.  Cervical spondylosis.   Electronically Signed   By: Inge Rise M.D.   On: 12/02/2014 19:40   Ct Cervical Spine Wo Contrast  11/30/2014   CLINICAL DATA:  Status post fall today.  EXAM: CT HEAD WITHOUT CONTRAST  CT CERVICAL SPINE WITHOUT CONTRAST  TECHNIQUE: Multidetector CT imaging of the head and  cervical spine was performed following the standard protocol without intravenous contrast. Multiplanar CT image reconstructions of the cervical spine were also generated.  COMPARISON:  Head CT scan 11/30/2005.  FINDINGS: CT HEAD FINDINGS  Chronic microvascular ischemic change is identified. There is no evidence of acute intracranial abnormality including hemorrhage, infarct, mass lesion, mass effect, midline shift or abnormal extra-axial fluid collection. The calvarium is intact. Imaged paranasal sinuses and mastoid air cells are clear.  CT CERVICAL SPINE FINDINGS  No cervical spine fracture is seen. 0.3 cm anterolisthesis C3 on C4 is due to facet arthropathy. There is marked loss of disc space height and endplate spurring at O8-7 and C6-7. Lung apices demonstrate partial visualization of layering bilateral pleural effusions.  IMPRESSION: No acute abnormality head or cervical spine.  Layering bilateral pleural effusions.  Atrophy and chronic microvascular ischemic change.  Cervical spondylosis.   Electronically Signed   By: Inge Rise M.D.   On: 12/08/2014 19:40   Dg Hip Unilat  With Pelvis 2-3 Views Right  11/11/2014   CLINICAL DATA:  Status post fall today. Right hip pain. Initial encounter.  EXAM: RIGHT HIP (WITH PELVIS) 2-3 VIEWS  COMPARISON:  None.  FINDINGS: The patient has an acute right intertrochanteric fracture. The lesser trochanter is a separate fragment. No other acute bony or joint abnormality is identified.  IMPRESSION: Acute right intertrochanteric fracture.   Electronically Signed   By: Inge Rise M.D.   On: 11/30/2014 18:52      Assessment/Plan Principal Problem:   Fracture, intertrochanteric, right femur Active Problems:   Hypertension   Hyperlipidemia   DM (diabetes mellitus)   COPD (chronic obstructive pulmonary disease)   CKD (chronic kidney disease) stage 3, GFR 30-59 ml/min   Chronic systolic CHF (congestive heart failure)   Atrial fibrillation and  flutter   1. Right Intertrochanteric Fracture -will be transferred to [facility] per orthopedics request -hold ASA anticoagulants for now surgery planned for tomorrow  2. HTN -will continue with antihypertensives -monitor pressures  3. Diabetes Mellitus II -monitor FSBS -SSI as needed -will check A1C  4. CKD III -monitor labs  5. COPD -currently stable -oxygen as needed -PRN inhalers as needed  6. Atrial  Fibrillation -while in the ED she developed Rapid A fibrillation to 140 -will need cardiology consult will see on arrival at [facility] -start on Beta Blocker now per Cards recomendation  7. Chronic Systolic Heart failure -continue lasix as ordered -CXR shows increased infiltrates c/w CHF  8. Hyperlipidemia -on statins  9. Ostomy  -from prior history of Colon cancer    Code Status: Full Code (must indicate code status--if unknown or must be presumed, indicate so) DVT Prophylaxis:SCD Family Communication:  (indicate person spoken with, if applicable, with phone number if by telephone) Disposition Plan: SNF (indicate anticipated LOS)  Time spent: 52min  Monica Rhodes A [facility] Pager [phone]

## 2014-12-03 NOTE — ED Notes (Addendum)
Pt presents to ED via EMS following a fall at home.  She bumped her right chin and landed on her right side.  Her right arm is sore and bruised, and her right hip is very sore and her leg is externally rotated and slightly shorter than the uninjured left leg.  Pain is currently 5/10. No LOC.  Pt is a/o x 4. Pt received 63mcg Fentanyl en route and is on 4L oxygen nasal cannula.  Normally uses 2L at home. 20g IV in left forearm.

## 2014-12-03 NOTE — Clinical Social Work Note (Signed)
Clinical Social Work Assessment  Patient Details  Name: Monica Rhodes MRN: [id number] Date of Birth: [birth year]  Date of referral:  12/01/2014               Reason for consult:   (Fall.)                Permission sought to share information with:   (None.) Permission granted to share information::  No  Name::        Agency::     Relationship::     Contact Information:     Housing/Transportation Living arrangements for the past 2 months:  Single Family Home (Son states that pt lives at home with other son in [place].) Source of Information:  Adult Children (The pt's 2 sons were present. All information collected was obtained from them.) Patient Interpreter Needed:  None Criminal Activity/Legal Involvement Pertinent to Current Situation/Hospitalization:  No - Comment as needed Significant Relationships:  Adult Children Lives with:    Do you feel safe going back to the place where you live?  Yes Need for family participation in patient care:   (Sons state the pt has a great support system, which consist of her children.)  Care giving concerns:  Per son, the pt currently lives at home with her other son in [place]. He states that the pt daughter often comes to visit and will help the pt with completing her ADL's as needed. Son states that the pt has a good support system. They informed CSW that they are not interested in a facility for pt. Family did not express any care giving concerns.   Social Worker assessment / plan:  CSW attempted to speak with pt at bedside. However, she was not communicative. Sons were present. Sons confirms that the pt presents to [facility] tonight due to falling. Son states " She said her walker slipped out from under her."  Son informed CSW that the pt lives with another son in [place], [place].   Employment status:  Retired Forensic scientist:  Medicare PT Recommendations:  Not assessed at this time Information / Referral to community resources:   (Sons  state that they are not interested in facility information.)  Patient/Family's Response to care:  Both sons are aware that the pt will be admitted. Their response is appropriate at this time.  Patient/Family's Understanding of and Emotional Response to Diagnosis, Current Treatment, and Prognosis:  Sons are knowledgeable about what brings the pt to [facility]. They express understanding of diagnosis.    Emotional Assessment Appearance:  Appears stated age Attitude/Demeanor/Rapport:  Unable to Assess (The pt was not effectively communicative at bedside.) Affect (typically observed):  Unable to Assess Orientation:   (Unable to assess.) Alcohol / Substance use:   (Son states that the pt never consumed alcohol. However, they state that she is a past smoker.) Psych involvement (Current and /or in the community):  No (Comment)  Discharge Needs  Concerns to be addressed:  Adjustment to Illness Readmission within the last 30 days:  No Current discharge risk:  None Barriers to Discharge:  No Barriers Identified   Bernita Buffy, LCSW 11/17/2014, 10:04 PM

## 2014-12-03 NOTE — Progress Notes (Signed)
CSW attempted to meet with pt. Nurse is present at bedside.   CSW will try and visit pt again later.  Willette Brace [phone] ED CSW 11/28/2014 6:24 PM

## 2014-12-03 NOTE — Progress Notes (Signed)
I arrived to place Pt on BiPAP but [facility] was already in room prepping the Pt for transport to [facility].  Decision was made to go ahead and place Pt on their BiPAP machine so she would be ready for transport.  Pt placed on BiPAP with settings of IPAP - 12 cm H2O, EPAP - 6 cm H2O, FiO2 - 100%, and back up rate of 16.  Pt stablized with HR -102, RR - 22, B/P - 130/64, and SpO2 - 97%.

## 2014-12-03 NOTE — Consult Note (Signed)
ORTHOPAEDIC CONSULTATION  REQUESTING PHYSICIAN: Allyne Gee, MD  Chief Complaint: Right hip pain  HPI: Monica Rhodes is a 81 y.o. female who complains of  acute right hip pain after mechanical fall today when she tripped over her walker. Pain is severe, difficulty walking, she normally is a household ambulator with a walker. She denies any other injuries. She has multiple medical problems at baseline. Pain is better with IV pain medications. Worse with movement.  Past Medical History  Diagnosis Date  . Hypertension   . UTI (urinary tract infection)   . SBO (small bowel obstruction)     Recurrent, prolonged hospitalization November, 2012  . Protein calorie malnutrition   . Hyperlipidemia   . DM (diabetes mellitus)   . COPD (chronic obstructive pulmonary disease)   . PVD (peripheral vascular disease)   . Colon cancer   . Bradycardia   . Tobacco abuse   . CAD (coronary artery disease)     Non-STEMI November, 2012,, Cardiac catheterization was attempted, despite significant efforts catheters could not be progressed to the coronary arteries ., Therefore medical therapy  . Carotid artery disease     Doppler  April 16, 2011,, total occlusion LICA, 90-24% R. ICA  . PAD (peripheral artery disease)     Suggestions significant stenosis distal aorta by CT angiogram  November, 2012  . Ejection fraction     EF 60%, echo, November, 2012,  . Mitral regurgitation     Moderate, echo, November, 2012  . Right ventricular dysfunction     Moderate, echo, November, 2012  . Edema     December, 2012  . Rash     Rash  probably related to hydralazine  December, 2012  . Hx of echocardiogram     Echo 3/16:  EF 35-40%, ant-lat HK, mod MR, mod LAE, mod to large pericardial effusion with no evidence of tamponade (unchanged from prior echo)   Past Surgical History  Procedure Laterality Date  . Colostomy    . Abdominoperineal proctocolectomy    . Left heart catheterization with coronary  angiogram N/A 04/24/2011    Procedure: LEFT HEART CATHETERIZATION WITH CORONARY ANGIOGRAM;  Surgeon: Josue Hector, MD;  Location: [facility] CATH LAB;  Service: Cardiovascular;  Laterality: N/A;   History   Social History  . Marital Status: Widowed    Spouse Name: N/A  . Number of Children: 5  . Years of Education: N/A   Social History Main Topics  . Smoking status: Former Smoker -- 0.50 packs/day    Types: Cigarettes    Quit date: 06/12/2014  . Smokeless tobacco: Not on file  . Alcohol Use: No  . Drug Use: No  . Sexual Activity: Not Currently   Other Topics Concern  . None   Social History Narrative   Family History  Problem Relation Age of Onset  . Coronary artery disease    . Heart attack Mother   . Hypertension Mother   . Hypertension Father   . Hypertension Sister   . Stroke Neg Hx    Allergies  Allergen Reactions  . Promethazine Other (See Comments)    Unknown    Prior to Admission medications   Medication Sig Start Date End Date Taking? Authorizing Provider  amLODipine (NORVASC) 10 MG tablet Take 1 tablet (10 mg total) by mouth daily. 09/04/14  Yes Carlena Bjornstad, MD  aspirin EC 81 MG EC tablet Take 1 tablet (81 mg total) by mouth daily. 08/11/14  Yes Debbe Odea, MD  atorvastatin (LIPITOR) 20 MG tablet Take 1 tablet (20 mg total) by mouth daily at 6 PM. 09/04/14  Yes Carlena Bjornstad, MD  carvedilol (COREG) 6.25 MG tablet Take 1 tablet (6.25 mg total) by mouth 2 (two) times daily with a meal. 09/04/14  Yes Carlena Bjornstad, MD  feeding supplement, ENSURE COMPLETE, (ENSURE COMPLETE) LIQD Take 237 mLs by mouth 2 (two) times daily between meals. 08/11/14  Yes Debbe Odea, MD  furosemide (LASIX) 40 MG tablet Take 40 mg BID until weight is 121 lbs then take 40 mg daily Patient taking differently: Take 40 mg by mouth daily.  09/04/14  Yes Carlena Bjornstad, MD  hydrALAZINE (APRESOLINE) 25 MG tablet TAKE 1 TABLET (25 MG TOTAL) BY MOUTH 3 (THREE) TIMES DAILY. 11/02/14  Yes Carlena Bjornstad, MD  isosorbide mononitrate (IMDUR) 30 MG 24 hr tablet Take 1 tablet (30 mg total) by mouth daily. 09/04/14  Yes Carlena Bjornstad, MD  LINZESS 145 MCG CAPS capsule Take 145 mcg by mouth as needed (take as needed for IBS).  07/19/14  Yes Historical Provider, MD  nitroGLYCERIN (NITROSTAT) 0.4 MG SL tablet Place 1 tablet (0.4 mg total) under the tongue every 5 (five) minutes as needed. For chest pain 09/04/14 09/04/15 Yes Carlena Bjornstad, MD   Dg Chest 1 View  11/28/2014   CLINICAL DATA:  Shortness of breath.  EXAM: CHEST  1 VIEW  COMPARISON:  August 10, 2014.  FINDINGS: Stable cardiomegaly. No pneumothorax is noted. Increased interstitial and airspace opacities are noted in both lungs concerning for pneumonia or edema. Stable bilateral pleural effusions are noted. Bony thorax is intact.  IMPRESSION: Increased bilateral lung opacities are noted concerning for pneumonia or edema. Stable cardiomegaly and bilateral pleural effusions are noted.   Electronically Signed   By: Marijo Conception, M.D.   On: 11/29/2014 20:38   Ct Head Wo Contrast  12/02/2014   CLINICAL DATA:  Status post fall today.  EXAM: CT HEAD WITHOUT CONTRAST  CT CERVICAL SPINE WITHOUT CONTRAST  TECHNIQUE: Multidetector CT imaging of the head and cervical spine was performed following the standard protocol without intravenous contrast. Multiplanar CT image reconstructions of the cervical spine were also generated.  COMPARISON:  Head CT scan 11/30/2005.  FINDINGS: CT HEAD FINDINGS  Chronic microvascular ischemic change is identified. There is no evidence of acute intracranial abnormality including hemorrhage, infarct, mass lesion, mass effect, midline shift or abnormal extra-axial fluid collection. The calvarium is intact. Imaged paranasal sinuses and mastoid air cells are clear.  CT CERVICAL SPINE FINDINGS  No cervical spine fracture is seen. 0.3 cm anterolisthesis C3 on C4 is due to facet arthropathy. There is marked loss of disc space height and  endplate spurring at K0-2 and C6-7. Lung apices demonstrate partial visualization of layering bilateral pleural effusions.  IMPRESSION: No acute abnormality head or cervical spine.  Layering bilateral pleural effusions.  Atrophy and chronic microvascular ischemic change.  Cervical spondylosis.   Electronically Signed   By: Inge Rise M.D.   On: 11/30/2014 19:40   Ct Cervical Spine Wo Contrast  11/21/2014   CLINICAL DATA:  Status post fall today.  EXAM: CT HEAD WITHOUT CONTRAST  CT CERVICAL SPINE WITHOUT CONTRAST  TECHNIQUE: Multidetector CT imaging of the head and cervical spine was performed following the standard protocol without intravenous contrast. Multiplanar CT image reconstructions of the cervical spine were also generated.  COMPARISON:  Head CT scan 11/30/2005.  FINDINGS: CT HEAD FINDINGS  Chronic microvascular  ischemic change is identified. There is no evidence of acute intracranial abnormality including hemorrhage, infarct, mass lesion, mass effect, midline shift or abnormal extra-axial fluid collection. The calvarium is intact. Imaged paranasal sinuses and mastoid air cells are clear.  CT CERVICAL SPINE FINDINGS  No cervical spine fracture is seen. 0.3 cm anterolisthesis C3 on C4 is due to facet arthropathy. There is marked loss of disc space height and endplate spurring at U1-3 and C6-7. Lung apices demonstrate partial visualization of layering bilateral pleural effusions.  IMPRESSION: No acute abnormality head or cervical spine.  Layering bilateral pleural effusions.  Atrophy and chronic microvascular ischemic change.  Cervical spondylosis.   Electronically Signed   By: Inge Rise M.D.   On: 12/09/2014 19:40   Dg Knee Complete 4 Views Right  12/02/2014   CLINICAL DATA:  Fall at home.  Right knee pain.  Right hip fracture  EXAM: RIGHT KNEE - COMPLETE 4+ VIEW  COMPARISON:  Pelvic radiograph same day  FINDINGS: There is narrowing of the medial compartment and lateral compartment. No  acute fracture. No joint effusion. Atherosclerotic calcification  IMPRESSION: No acute findings of the right knee.   Electronically Signed   By: Suzy Bouchard M.D.   On: 11/13/2014 20:38   Dg Hip Unilat  With Pelvis 2-3 Views Right  12/01/2014   CLINICAL DATA:  Status post fall today. Right hip pain. Initial encounter.  EXAM: RIGHT HIP (WITH PELVIS) 2-3 VIEWS  COMPARISON:  None.  FINDINGS: The patient has an acute right intertrochanteric fracture. The lesser trochanter is a separate fragment. No other acute bony or joint abnormality is identified.  IMPRESSION: Acute right intertrochanteric fracture.   Electronically Signed   By: Inge Rise M.D.   On: 11/18/2014 18:52    Positive ROS: All other systems have been reviewed and were otherwise negative with the exception of those mentioned in the HPI and as above.  Physical Exam: General: She is extremely frail, sitting in bed, in no acute distress although she is on a nonrebreather mask, with oxygen. Cardiovascular: No significant pedal edema Respiratory:mild cyanosis, positive use of accessory musculature on oxygen. Gastrointestinal exam: No organomegaly, abdomen is soft and non-tender Skin: No lesions in the area of chief complaint with the exception of bruising over the right hip. Neurologic: Sensation intact distally Psychiatric: Patient interacts appropriately with me, although it is hard to determine if she is competent for consent. Her sons are with her at the bedside and present for the entire discussion participating actively.  Lymphatic: No axillary or cervical lymphadenopathy  MUSCULOSKELETAL right leg has positive logroll, EHL and FHL are firing, sensation intact distally, shortening and external rotation present on the right leg.  Assessment: Acute right displaced intertrochanteric hip fracture in a household ambulator with severe medical comorbidities as indicated above.  Plan: This is an acute severe injury to his her  function as well as independence. I recommended surgical intervention in order to restore her capacity to ambulate if at all possible, and also to minimize the risks for DVT and pneumonia and decubitus ulcer. I have discussed the options for nonsurgical management, however the family is very clearly not interested in that and wishes to be able to offer her the opportunity to walk again.  The risks benefits and alternatives were discussed with the patient including but not limited to the risks of nonoperative treatment, versus surgical intervention including infection, bleeding, nerve injury, malunion, nonunion, the need for revision surgery, hardware prominence, hardware failure, the need  for hardware removal, blood clots, cardiopulmonary complications, morbidity, mortality, among others, and they were willing to proceed.    Surgical intervention will likely be tomorrow if she is optimized per the [facility]. Appreciate their help on managing her complex medical problems.  Johnny Bridge, MD Cell [phone]   11/20/2014 9:39 PM

## 2014-12-03 NOTE — ED Provider Notes (Signed)
CSN: [id number]     Arrival date & time 11/20/2014  1804 History   First MD Initiated Contact with Patient 11/14/2014 1816     Chief Complaint  Patient presents with  . Fall  . Hip Pain     (Consider location/radiation/quality/duration/timing/severity/associated sxs/prior Treatment) Patient is a 81 y.o. female presenting with fall and hip pain. The history is provided by the patient and medical records. No language interpreter was used.  Fall Associated symptoms include arthralgias (right hip). Pertinent negatives include no abdominal pain, chest pain, coughing, diaphoresis, fatigue, fever, headaches, nausea, rash or vomiting.  Hip Pain Associated symptoms include arthralgias (right hip). Pertinent negatives include no abdominal pain, chest pain, coughing, diaphoresis, fatigue, fever, headaches, nausea, rash or vomiting.     VENNIE SALSBURY is a 81 y.o. female  with a hx of HTN, UTI, SBO, NIDDM, COPD, PVD & PAD, CAD presents to the Emergency Department complaining of acute right hip pain after mechanical fall just PTA.  Pt reports she walks with a walker at home and was turning to reach something in the kitchen when she lost her balance and fell.  Pt reports she hit her face on the floor, but did not have an LOC.  She denies blood thinners and there are none listed on her medication list.  Associated symptoms include shortening and rotation of the right leg.  She also reports subjective decrease in sensation to the RLE.  Pt reports that nothing makes better and movement makes it worse.  Pt denies fever, chills, neck pain, chest pain, SOB, abd pain, N/V/D.  Marland Kitchen     Past Medical History  Diagnosis Date  . Hypertension   . UTI (urinary tract infection)   . SBO (small bowel obstruction)     Recurrent, prolonged hospitalization November, 2012  . Protein calorie malnutrition   . Hyperlipidemia   . DM (diabetes mellitus)   . COPD (chronic obstructive pulmonary disease)   . PVD (peripheral vascular  disease)   . Colon cancer   . Bradycardia   . Tobacco abuse   . CAD (coronary artery disease)     Non-STEMI November, 2012,, Cardiac catheterization was attempted, despite significant efforts catheters could not be progressed to the coronary arteries ., Therefore medical therapy  . Carotid artery disease     Doppler  April 16, 2011,, total occlusion LICA, 09-47% R. ICA  . PAD (peripheral artery disease)     Suggestions significant stenosis distal aorta by CT angiogram  November, 2012  . Ejection fraction     EF 60%, echo, November, 2012,  . Mitral regurgitation     Moderate, echo, November, 2012  . Right ventricular dysfunction     Moderate, echo, November, 2012  . Edema     December, 2012  . Rash     Rash  probably related to hydralazine  December, 2012  . Hx of echocardiogram     Echo 3/16:  EF 35-40%, ant-lat HK, mod MR, mod LAE, mod to large pericardial effusion with no evidence of tamponade (unchanged from prior echo)   Past Surgical History  Procedure Laterality Date  . Colostomy    . Abdominoperineal proctocolectomy    . Left heart catheterization with coronary angiogram N/A 04/24/2011    Procedure: LEFT HEART CATHETERIZATION WITH CORONARY ANGIOGRAM;  Surgeon: Josue Hector, MD;  Location: [facility] CATH LAB;  Service: Cardiovascular;  Laterality: N/A;   Family History  Problem Relation Age of Onset  . Coronary artery disease    .  Heart attack Mother   . Hypertension Mother   . Hypertension Father   . Hypertension Sister   . Stroke Neg Hx    History  Substance Use Topics  . Smoking status: Former Smoker -- 0.50 packs/day    Types: Cigarettes    Quit date: 06/12/2014  . Smokeless tobacco: Not on file  . Alcohol Use: No   OB History    No data available     Review of Systems  Constitutional: Negative for fever, diaphoresis, appetite change, fatigue and unexpected weight change.  HENT: Positive for facial swelling (right chin). Negative for mouth sores.   Eyes:  Negative for visual disturbance.  Respiratory: Negative for cough, chest tightness, shortness of breath and wheezing.   Cardiovascular: Negative for chest pain.  Gastrointestinal: Negative for nausea, vomiting, abdominal pain, diarrhea and constipation.  Endocrine: Negative for polydipsia, polyphagia and polyuria.  Genitourinary: Negative for dysuria, urgency, frequency and hematuria.  Musculoskeletal: Positive for arthralgias (right hip). Negative for back pain and neck stiffness.  Skin: Positive for wound ( skin tear right chin). Negative for rash.  Allergic/Immunologic: Negative for immunocompromised state.  Neurological: Negative for syncope, light-headedness and headaches.  Hematological: Does not bruise/bleed easily.  Psychiatric/Behavioral: Negative for sleep disturbance. The patient is not nervous/anxious.       Allergies  Promethazine  Home Medications   Prior to Admission medications   Medication Sig Start Date End Date Taking? Authorizing Provider  amLODipine (NORVASC) 10 MG tablet Take 1 tablet (10 mg total) by mouth daily. 09/04/14  Yes Carlena Bjornstad, MD  aspirin EC 81 MG EC tablet Take 1 tablet (81 mg total) by mouth daily. 08/11/14  Yes Debbe Odea, MD  atorvastatin (LIPITOR) 20 MG tablet Take 1 tablet (20 mg total) by mouth daily at 6 PM. 09/04/14  Yes Carlena Bjornstad, MD  carvedilol (COREG) 6.25 MG tablet Take 1 tablet (6.25 mg total) by mouth 2 (two) times daily with a meal. 09/04/14  Yes Carlena Bjornstad, MD  feeding supplement, ENSURE COMPLETE, (ENSURE COMPLETE) LIQD Take 237 mLs by mouth 2 (two) times daily between meals. 08/11/14  Yes Debbe Odea, MD  furosemide (LASIX) 40 MG tablet Take 40 mg BID until weight is 121 lbs then take 40 mg daily Patient taking differently: Take 40 mg by mouth daily.  09/04/14  Yes Carlena Bjornstad, MD  hydrALAZINE (APRESOLINE) 25 MG tablet TAKE 1 TABLET (25 MG TOTAL) BY MOUTH 3 (THREE) TIMES DAILY. 11/02/14  Yes Carlena Bjornstad, MD  isosorbide  mononitrate (IMDUR) 30 MG 24 hr tablet Take 1 tablet (30 mg total) by mouth daily. 09/04/14  Yes Carlena Bjornstad, MD  LINZESS 145 MCG CAPS capsule Take 145 mcg by mouth as needed (take as needed for IBS).  07/19/14  Yes Historical Provider, MD  nitroGLYCERIN (NITROSTAT) 0.4 MG SL tablet Place 1 tablet (0.4 mg total) under the tongue every 5 (five) minutes as needed. For chest pain 09/04/14 09/04/15 Yes Carlena Bjornstad, MD   BP 132/70 mmHg  Pulse 117  Temp(Src) 97.5 F (36.4 C)  Resp 20  Ht 5\' 2"  (1.575 m)  Wt 123 lb (55.792 kg)  BMI 22.49 kg/m2  SpO2 81% Physical Exam  Constitutional: She is oriented to person, place, and time. She appears well-developed and well-nourished. No distress.  Awake, alert, nontoxic appearance  HENT:  Head: Normocephalic.  Right Ear: Tympanic membrane, external ear and ear canal normal.  Left Ear: Tympanic membrane, external ear and ear  canal normal.  Nose: Nose normal. No epistaxis. Right sinus exhibits no maxillary sinus tenderness and no frontal sinus tenderness. Left sinus exhibits no maxillary sinus tenderness and no frontal sinus tenderness.  Mouth/Throat: Uvula is midline, oropharynx is clear and moist and mucous membranes are normal. Mucous membranes are not pale and not cyanotic. No oropharyngeal exudate, posterior oropharyngeal edema, posterior oropharyngeal erythema or tonsillar abscesses.  Skin tear to the right chin without large laceration  Eyes: Conjunctivae are normal. Pupils are equal, round, and reactive to light. No scleral icterus.  Neck: Normal range of motion and full passive range of motion without pain. Neck supple.  No TTP of the midline or paraspinal muscles No step-off or deformity  Cardiovascular: Normal rate, normal heart sounds and intact distal pulses.  An irregular rhythm present.  Pulses:      Radial pulses are 2+ on the right side, and 2+ on the left side.       Dorsalis pedis pulses are 2+ on the right side, and 2+ on the left  side.  Capillary refill < 3 sec BLE  Pulmonary/Chest: Effort normal and breath sounds normal. No stridor. No respiratory distress. She has no wheezes.  Equal chest expansion  Abdominal: Soft. Bowel sounds are normal. She exhibits no mass. There is no tenderness. There is no rebound and no guarding.  Soft and nontender Colostomy in place with brown stool  Musculoskeletal: Normal range of motion. She exhibits no edema.  TTP of the right hip - rotation and shortening of the right leg; palpable deformity of the right hip; pelvis stable  Lymphadenopathy:    She has no cervical adenopathy.  Neurological: She is alert and oriented to person, place, and time.  Speech is clear and goal oriented Moves extremities without ataxia 5/5 in the BUE and LLE; unable to strength test RLE due to pain in the right hip/leg  Skin: Skin is warm and dry. No rash noted. She is not diaphoretic.  Psychiatric: She has a normal mood and affect.  Nursing note and vitals reviewed.   ED Course  Procedures (including critical care time) Labs Review Labs Reviewed  BASIC METABOLIC PANEL - Abnormal; Notable for the following:    Chloride 96 (*)    CO2 37 (*)    Glucose, Bld 173 (*)    BUN 53 (*)    Creatinine, Ser 1.31 (*)    Calcium 8.5 (*)    GFR calc non Af Amer 36 (*)    GFR calc Af Amer 42 (*)    All other components within normal limits  CBC WITH DIFFERENTIAL/PLATELET - Abnormal; Notable for the following:    RBC 3.53 (*)    Hemoglobin 9.6 (*)    HCT 32.7 (*)    MCHC 29.4 (*)    All other components within normal limits  PROTIME-INR  URINALYSIS, ROUTINE W REFLEX MICROSCOPIC (NOT AT [facility])  BRAIN NATRIURETIC PEPTIDE  TYPE AND SCREEN  ABO/RH    Imaging Review Dg Chest 1 View  12/07/2014   CLINICAL DATA:  Shortness of breath.  EXAM: CHEST  1 VIEW  COMPARISON:  August 10, 2014.  FINDINGS: Stable cardiomegaly. No pneumothorax is noted. Increased interstitial and airspace opacities are noted in both  lungs concerning for pneumonia or edema. Stable bilateral pleural effusions are noted. Bony thorax is intact.  IMPRESSION: Increased bilateral lung opacities are noted concerning for pneumonia or edema. Stable cardiomegaly and bilateral pleural effusions are noted.   Electronically Signed   By:  Marijo Conception, M.D.   On: 11/30/2014 20:38   Ct Head Wo Contrast  11/24/2014   CLINICAL DATA:  Status post fall today.  EXAM: CT HEAD WITHOUT CONTRAST  CT CERVICAL SPINE WITHOUT CONTRAST  TECHNIQUE: Multidetector CT imaging of the head and cervical spine was performed following the standard protocol without intravenous contrast. Multiplanar CT image reconstructions of the cervical spine were also generated.  COMPARISON:  Head CT scan 11/30/2005.  FINDINGS: CT HEAD FINDINGS  Chronic microvascular ischemic change is identified. There is no evidence of acute intracranial abnormality including hemorrhage, infarct, mass lesion, mass effect, midline shift or abnormal extra-axial fluid collection. The calvarium is intact. Imaged paranasal sinuses and mastoid air cells are clear.  CT CERVICAL SPINE FINDINGS  No cervical spine fracture is seen. 0.3 cm anterolisthesis C3 on C4 is due to facet arthropathy. There is marked loss of disc space height and endplate spurring at A5-6 and C6-7. Lung apices demonstrate partial visualization of layering bilateral pleural effusions.  IMPRESSION: No acute abnormality head or cervical spine.  Layering bilateral pleural effusions.  Atrophy and chronic microvascular ischemic change.  Cervical spondylosis.   Electronically Signed   By: Inge Rise M.D.   On: 11/27/2014 19:40   Ct Cervical Spine Wo Contrast  12/02/2014   CLINICAL DATA:  Status post fall today.  EXAM: CT HEAD WITHOUT CONTRAST  CT CERVICAL SPINE WITHOUT CONTRAST  TECHNIQUE: Multidetector CT imaging of the head and cervical spine was performed following the standard protocol without intravenous contrast. Multiplanar CT image  reconstructions of the cervical spine were also generated.  COMPARISON:  Head CT scan 11/30/2005.  FINDINGS: CT HEAD FINDINGS  Chronic microvascular ischemic change is identified. There is no evidence of acute intracranial abnormality including hemorrhage, infarct, mass lesion, mass effect, midline shift or abnormal extra-axial fluid collection. The calvarium is intact. Imaged paranasal sinuses and mastoid air cells are clear.  CT CERVICAL SPINE FINDINGS  No cervical spine fracture is seen. 0.3 cm anterolisthesis C3 on C4 is due to facet arthropathy. There is marked loss of disc space height and endplate spurring at P7-9 and C6-7. Lung apices demonstrate partial visualization of layering bilateral pleural effusions.  IMPRESSION: No acute abnormality head or cervical spine.  Layering bilateral pleural effusions.  Atrophy and chronic microvascular ischemic change.  Cervical spondylosis.   Electronically Signed   By: Inge Rise M.D.   On: 12/02/2014 19:40   Dg Knee Complete 4 Views Right  11/24/2014   CLINICAL DATA:  Fall at home.  Right knee pain.  Right hip fracture  EXAM: RIGHT KNEE - COMPLETE 4+ VIEW  COMPARISON:  Pelvic radiograph same day  FINDINGS: There is narrowing of the medial compartment and lateral compartment. No acute fracture. No joint effusion. Atherosclerotic calcification  IMPRESSION: No acute findings of the right knee.   Electronically Signed   By: Suzy Bouchard M.D.   On: 11/17/2014 20:38   Dg Hip Unilat  With Pelvis 2-3 Views Right  12/01/2014   CLINICAL DATA:  Status post fall today. Right hip pain. Initial encounter.  EXAM: RIGHT HIP (WITH PELVIS) 2-3 VIEWS  COMPARISON:  None.  FINDINGS: The patient has an acute right intertrochanteric fracture. The lesser trochanter is a separate fragment. No other acute bony or joint abnormality is identified.  IMPRESSION: Acute right intertrochanteric fracture.   Electronically Signed   By: Inge Rise M.D.   On: 11/18/2014 18:52      EKG Interpretation   Date/Time:  Thursday December 03 2014 21:22:07 EDT Ventricular Rate:  113 PR Interval:    QRS Duration: 104 QT Interval:  352 QTC Calculation: 483 R Axis:   89 Text Interpretation:  Atrial fibrillation Ventricular premature complex  Confirmed by Jeneen Rinks  MD, [place] ([id number]) on 12/04/2014 9:27:47 PM    CRITICAL CARE Performed by: Abigail Butts Total critical care time: 39min Critical care time was exclusive of separately billable procedures and treating other patients. Critical care was necessary to treat or prevent imminent or life-threatening deterioration. Critical care was time spent personally by me on the following activities: development of treatment plan with patient and/or surrogate as well as nursing, discussions with consultants, evaluation of patient's response to treatment, examination of patient, obtaining history from patient or surrogate, ordering and performing treatments and interventions, ordering and review of laboratory studies, ordering and review of radiographic studies, pulse oximetry and re-evaluation of patient's condition.   MDM   Final diagnoses:  SOB (shortness of breath)  Closed right hip fracture, initial encounter  Chronic systolic CHF (congestive heart failure)  Chronic obstructive pulmonary disease, unspecified COPD, unspecified chronic bronchitis type   Monica Rhodes presents after fall at home.  Pt reports this was mechanical.  Pt with skin tear on the chin, will CT head and c-spine.  Palpable deformity to the right hip.  Will obtain blood work, x-rays and give pain control.  Suspect right hip fracture.    7:15PM X-ray with acute intertrochanteric fracture.  Discussed with family who is at bedside.  Fall was witnessed and pt did not have and LOC.  She has no previous relationship with an orthopedist.    7:45PM CT head/cervical spine without acute abnormality.  Pt also with increased work of breathing and CT with layering  bilateral effusions.  Pt takes lasix at home; will give lasix 40mg  IV.  CXR pending.  Pt is afebrile.  Oxygen saturations ~90% on 4l via [place].  EKG with A. fib. Record review shows that patient has a history of the same. She is not anticoagulated.  8:38 PM Discussed with Dr. Mardelle Matte who will evaluate tonight with plan for repair tomorrow afternoon at [facility].  Pt will need transfer.  Discussed with Dr. Humphrey Rolls who will admit to [facility] and transfer patient to [facility].    The patient was discussed with and seen by Dr. Regenia Skeeter who agrees with the treatment plan.  9:30PM Patient rechecked. Now with tachycardia into the 140s. Repeat EKG now A. fib with RVR.  Dr. Humphrey Rolls notified.    10:00PM Patient discussed with cardiology who recommends no calcium channel blocker but instead beta blocker. Will give labetalol. Patient also given Lasix and she takes this at home for diuresis. She remains mildly hypoxic. Oxygen increased to 6 L via nasal cannula.  10:45PM Patient continues to have hypoxia. She is switched to a nonrebreather at 15 L/m. Her oxygen saturations improved into the low 90s. We'll continue to monitor. She does not need BiPAP at this time.  Patient reevaluated by myself and Dr. Regenia Skeeter.  11:56 PM Pt with continued decrease in oxygen saturations even with nonrebreather. Her heart rate has improved after labetalol. Patient will require BiPAP at this time. She is to be transferred to [facility] at this time.  She will need a stepdown bed.    BP 144/57 mmHg  Pulse 114  Temp(Src) 97.5 F (36.4 C)  Resp 19  Ht 5\' 2"  (1.575 m)  Wt 123 lb (55.792 kg)  BMI 22.49 kg/m2  SpO2  94%   Abigail Butts, PA-C 12/04/14 0000  Sherwood Gambler, MD 12/08/14 1438

## 2014-12-03 NOTE — ED Notes (Signed)
Bed: WA21 Expected date:  Expected time:  Means of arrival:  Comments: EMS fall  

## 2014-12-03 NOTE — ED Notes (Signed)
Called report to RN Nicolasa Ducking at [facility]. Pt en route via [facility].  Also called son Jori Moll to notify of new room assignment.

## 2014-12-03 NOTE — ED Notes (Signed)
Called report to Terri, Therapist, sports at [facility] and to [facility] for transfer.  [facility] ETA 1hr+.

## 2014-12-03 NOTE — ED Notes (Signed)
Pr Dr Regenia Skeeter, called respiratory to request BiPap.

## 2014-12-03 NOTE — ED Notes (Signed)
Pt c/o continued pain.  Provider notified.

## 2014-12-03 NOTE — Progress Notes (Signed)
EDCM spoke to patient and her two sons at bedside.  One son Monica Rhodes [phone].  Patient presents to Ed post fall at home with injury to right hip. Per patient's sons, patient lives with her son Monica Rhodes at home.  Patient has has [place] recently for home health services but services have completed per patient's sons.  Patient has a walker, lift chair, bedside comode, shower chair and oxygen concentrator at home.  Oxygen supplied by [facility].  Patient's son reports patient wears oxygen continuously at home.  Patient's son reports patient's daughter Monica Rhodes goes to patient's home and assists patient with washing and dressing and cleaning the home.  Patient is currently without  home health services.  Patient's sons confirm patient's pcp is Dr. Beatrix Shipper.  No further EDCM needs at this time.

## 2014-12-04 ENCOUNTER — Inpatient Hospital Stay (HOSPITAL_COMMUNITY): Payer: Medicare Other

## 2014-12-04 ENCOUNTER — Encounter (HOSPITAL_COMMUNITY): Payer: Self-pay | Admitting: *Deleted

## 2014-12-04 ENCOUNTER — Ambulatory Visit (HOSPITAL_COMMUNITY): Payer: Medicare Other

## 2014-12-04 DIAGNOSIS — E43 Unspecified severe protein-calorie malnutrition: Secondary | ICD-10-CM | POA: Diagnosis present

## 2014-12-04 DIAGNOSIS — J9601 Acute respiratory failure with hypoxia: Secondary | ICD-10-CM | POA: Diagnosis present

## 2014-12-04 DIAGNOSIS — J9602 Acute respiratory failure with hypercapnia: Secondary | ICD-10-CM

## 2014-12-04 DIAGNOSIS — I5022 Chronic systolic (congestive) heart failure: Secondary | ICD-10-CM

## 2014-12-04 DIAGNOSIS — I4892 Unspecified atrial flutter: Secondary | ICD-10-CM

## 2014-12-04 DIAGNOSIS — L8990 Pressure ulcer of unspecified site, unspecified stage: Secondary | ICD-10-CM | POA: Diagnosis present

## 2014-12-04 DIAGNOSIS — J449 Chronic obstructive pulmonary disease, unspecified: Secondary | ICD-10-CM

## 2014-12-04 DIAGNOSIS — I5041 Acute combined systolic (congestive) and diastolic (congestive) heart failure: Secondary | ICD-10-CM

## 2014-12-04 DIAGNOSIS — I4891 Unspecified atrial fibrillation: Secondary | ICD-10-CM

## 2014-12-04 DIAGNOSIS — I10 Essential (primary) hypertension: Secondary | ICD-10-CM

## 2014-12-04 LAB — COMPREHENSIVE METABOLIC PANEL
ALBUMIN: 3.1 g/dL — AB (ref 3.5–5.0)
ALK PHOS: 61 U/L (ref 38–126)
ALT: 12 U/L — ABNORMAL LOW (ref 14–54)
AST: 33 U/L (ref 15–41)
Anion gap: 10 (ref 5–15)
BILIRUBIN TOTAL: 0.5 mg/dL (ref 0.3–1.2)
BUN: 49 mg/dL — AB (ref 6–20)
CHLORIDE: 98 mmol/L — AB (ref 101–111)
CO2: 34 mmol/L — ABNORMAL HIGH (ref 22–32)
Calcium: 8.5 mg/dL — ABNORMAL LOW (ref 8.9–10.3)
Creatinine, Ser: 1.61 mg/dL — ABNORMAL HIGH (ref 0.44–1.00)
GFR calc Af Amer: 33 mL/min — ABNORMAL LOW (ref >60)
GFR calc non Af Amer: 28 mL/min — ABNORMAL LOW (ref >60)
Glucose, Bld: 142 mg/dL — ABNORMAL HIGH (ref 65–99)
POTASSIUM: 4.5 mmol/L (ref 3.5–5.1)
Sodium: 142 mmol/L (ref 135–145)
Total Protein: 6.5 g/dL (ref 6.5–8.1)

## 2014-12-04 LAB — MAGNESIUM: Magnesium: 1.8 mg/dL (ref 1.7–2.4)

## 2014-12-04 LAB — BLOOD GAS, ARTERIAL
Acid-Base Excess: 6.7 mmol/L — ABNORMAL HIGH (ref 0.0–2.0)
Acid-Base Excess: 6.5 mmol/L — ABNORMAL HIGH (ref 0.0–2.0)
BICARBONATE: 33.3 meq/L — AB (ref 20.0–24.0)
BICARBONATE: 33 meq/L — AB (ref 20.0–24.0)
DRAWN BY: 437071
Delivery systems: POSITIVE
Delivery systems: POSITIVE
Drawn by: 437071
EXPIRATORY PAP: 6
EXPIRATORY PAP: 8
FIO2: 1 %
FIO2: 0.5 %
INSPIRATORY PAP: 12
Inspiratory PAP: 16
Mode: POSITIVE
O2 Saturation: 91.8 %
O2 Saturation: 96.8 %
PATIENT TEMPERATURE: 97.5
PCO2 ART: 71.2 mmHg — AB (ref 35.0–45.0)
PH ART: 7.281 — AB (ref 7.350–7.450)
PH ART: 7.285 — AB (ref 7.350–7.450)
PO2 ART: 97.1 mmHg (ref 80.0–100.0)
Patient temperature: 97.7
RATE: 8 resp/min
TCO2: 35.6 mmol/L (ref 0–100)
TCO2: 35.2 mmol/L (ref 0–100)
pCO2 arterial: 72.5 mmHg (ref 35.0–45.0)
pO2, Arterial: 69 mmHg — ABNORMAL LOW (ref 80.0–100.0)

## 2014-12-04 LAB — SURGICAL PCR SCREEN
MRSA, PCR: NEGATIVE
STAPHYLOCOCCUS AUREUS: POSITIVE — AB

## 2014-12-04 LAB — GLUCOSE, CAPILLARY
GLUCOSE-CAPILLARY: 110 mg/dL — AB (ref 65–99)
GLUCOSE-CAPILLARY: 88 mg/dL (ref 65–99)
GLUCOSE-CAPILLARY: 132 mg/dL — AB (ref 65–99)
Glucose-Capillary: 144 mg/dL — ABNORMAL HIGH (ref 65–99)

## 2014-12-04 LAB — TSH: TSH: 3.656 u[IU]/mL (ref 0.350–4.500)

## 2014-12-04 LAB — CBC WITH DIFFERENTIAL/PLATELET
BASOS PCT: 0 % (ref 0–1)
Basophils Absolute: 0 10*3/uL (ref 0.0–0.1)
Eosinophils Absolute: 0 10*3/uL (ref 0.0–0.7)
Eosinophils Relative: 0 % (ref 0–5)
HCT: 33.2 % — ABNORMAL LOW (ref 36.0–46.0)
Hemoglobin: 9.8 g/dL — ABNORMAL LOW (ref 12.0–15.0)
Lymphocytes Relative: 8 % — ABNORMAL LOW (ref 12–46)
Lymphs Abs: 0.6 10*3/uL — ABNORMAL LOW (ref 0.7–4.0)
MCH: 27.5 pg (ref 26.0–34.0)
MCHC: 29.5 g/dL — AB (ref 30.0–36.0)
MCV: 93 fL (ref 78.0–100.0)
Monocytes Absolute: 0.7 10*3/uL (ref 0.1–1.0)
Monocytes Relative: 9 % (ref 3–12)
NEUTROS ABS: 6.6 10*3/uL (ref 1.7–7.7)
Neutrophils Relative %: 83 % — ABNORMAL HIGH (ref 43–77)
PLATELETS: 153 10*3/uL (ref 150–400)
RBC: 3.57 MIL/uL — ABNORMAL LOW (ref 3.87–5.11)
RDW: 13.8 % (ref 11.5–15.5)
WBC: 7.9 10*3/uL (ref 4.0–10.5)

## 2014-12-04 LAB — PHOSPHORUS: Phosphorus: 4.5 mg/dL (ref 2.5–4.6)

## 2014-12-04 MED ORDER — CHLORHEXIDINE GLUCONATE 0.12 % MT SOLN: 15.0000 mL | Freq: Two times a day (BID) | OROMUCOSAL | Status: DC

## 2014-12-04 MED ORDER — VITAL AF 1.2 CAL PO LIQD: 1000.0000 mL | ORAL | Status: DC

## 2014-12-04 MED ORDER — ATORVASTATIN CALCIUM 20 MG PO TABS
20.0000 mg | ORAL_TABLET | Freq: Every day | ORAL | Status: DC
  Administered 2014-12-05 – 2014-12-08 (×5): 20 mg
  Filled 2014-12-04 (×8): qty 1

## 2014-12-04 MED ORDER — CETYLPYRIDINIUM CHLORIDE 0.05 % MT LIQD: 7.0000 mL | Freq: Two times a day (BID) | OROMUCOSAL | Status: DC

## 2014-12-04 MED ORDER — METOPROLOL TARTRATE 1 MG/ML IV SOLN
2.5000 mg | INTRAVENOUS | Status: DC | PRN
  Filled 2014-12-04 (×2): qty 5

## 2014-12-04 MED ORDER — SODIUM CHLORIDE 0.9 % IV SOLN
25.0000 ug/h | INTRAVENOUS | Status: DC
  Administered 2014-12-04: 50 ug/h via INTRAVENOUS
  Filled 2014-12-04: qty 50

## 2014-12-04 MED ORDER — ALBUTEROL SULFATE (2.5 MG/3ML) 0.083% IN NEBU: 2.5000 mg | INHALATION_SOLUTION | RESPIRATORY_TRACT | Status: DC | PRN

## 2014-12-04 MED ORDER — FENTANYL CITRATE (PF) 100 MCG/2ML IJ SOLN
50.0000 ug | INTRAMUSCULAR | Status: DC | PRN
  Filled 2014-12-04: qty 2

## 2014-12-04 MED ORDER — SODIUM CHLORIDE 0.9 % IV SOLN
INTRAVENOUS | Status: DC
  Administered 2014-12-04 – 2014-12-05 (×3): via INTRAVENOUS

## 2014-12-04 MED ORDER — FENTANYL BOLUS VIA INFUSION
25.0000 ug | INTRAVENOUS | Status: DC | PRN
  Filled 2014-12-04: qty 25

## 2014-12-04 MED ORDER — NITROGLYCERIN 0.4 MG SL SUBL: 0.4000 mg | SUBLINGUAL_TABLET | SUBLINGUAL | Status: DC | PRN

## 2014-12-04 MED ORDER — MIDAZOLAM HCL 2 MG/2ML IJ SOLN
1.0000 mg | INTRAMUSCULAR | Status: DC | PRN
  Administered 2014-12-06: 1 mg via INTRAVENOUS

## 2014-12-04 MED ORDER — FENTANYL CITRATE (PF) 100 MCG/2ML IJ SOLN: 25.0000 ug | INTRAMUSCULAR | Status: DC | PRN

## 2014-12-04 MED ORDER — FUROSEMIDE 40 MG PO TABS
40.0000 mg | ORAL_TABLET | Freq: Every day | ORAL | Status: DC
  Filled 2014-12-04: qty 1

## 2014-12-04 MED ORDER — OXYCODONE HCL 5 MG PO TABS: 5.0000 mg | ORAL_TABLET | ORAL | Status: DC | PRN

## 2014-12-04 MED ORDER — DEXTROSE 5 % IV SOLN
500.0000 mg | INTRAVENOUS | Status: DC
  Filled 2014-12-04: qty 500

## 2014-12-04 MED ORDER — FENTANYL CITRATE (PF) 100 MCG/2ML IJ SOLN: 25.0000 ug | Freq: Two times a day (BID) | INTRAMUSCULAR | Status: DC | PRN

## 2014-12-04 MED ORDER — POLYETHYLENE GLYCOL 3350 17 G PO PACK
17.0000 g | PACK | Freq: Every day | ORAL | Status: DC | PRN
  Filled 2014-12-04: qty 1

## 2014-12-04 MED ORDER — LIDOCAINE HCL (CARDIAC) 20 MG/ML IV SOLN
INTRAVENOUS | Status: AC
  Filled 2014-12-04: qty 5

## 2014-12-04 MED ORDER — SUCCINYLCHOLINE CHLORIDE 20 MG/ML IJ SOLN
INTRAMUSCULAR | Status: AC
  Filled 2014-12-04: qty 1

## 2014-12-04 MED ORDER — INSULIN ASPART 100 UNIT/ML ~~LOC~~ SOLN: 0.0000 [IU] | Freq: Three times a day (TID) | SUBCUTANEOUS | Status: DC

## 2014-12-04 MED ORDER — PRO-STAT SUGAR FREE PO LIQD
30.0000 mL | Freq: Two times a day (BID) | ORAL | Status: AC
  Administered 2014-12-04 (×2): 30 mL
  Filled 2014-12-04 (×2): qty 30

## 2014-12-04 MED ORDER — ISOSORBIDE MONONITRATE ER 30 MG PO TB24
30.0000 mg | ORAL_TABLET | Freq: Every day | ORAL | Status: DC
  Filled 2014-12-04: qty 1

## 2014-12-04 MED ORDER — ETOMIDATE 2 MG/ML IV SOLN
INTRAVENOUS | Status: AC
  Administered 2014-12-04: 20 mg
  Filled 2014-12-04: qty 20

## 2014-12-04 MED ORDER — FENTANYL CITRATE (PF) 100 MCG/2ML IJ SOLN
50.0000 ug | Freq: Once | INTRAMUSCULAR | Status: AC
  Administered 2014-12-04: 100 ug via INTRAVENOUS

## 2014-12-04 MED ORDER — INSULIN ASPART 100 UNIT/ML ~~LOC~~ SOLN
0.0000 [IU] | SUBCUTANEOUS | Status: DC
  Administered 2014-12-04 – 2014-12-05 (×4): 2 [IU] via SUBCUTANEOUS
  Administered 2014-12-05: 3 [IU] via SUBCUTANEOUS
  Administered 2014-12-05 (×2): 2 [IU] via SUBCUTANEOUS
  Administered 2014-12-06: 3 [IU] via SUBCUTANEOUS
  Administered 2014-12-06: 5 [IU] via SUBCUTANEOUS
  Administered 2014-12-06: 2 [IU] via SUBCUTANEOUS
  Administered 2014-12-06: 5 [IU] via SUBCUTANEOUS
  Administered 2014-12-07: 2 [IU] via SUBCUTANEOUS
  Administered 2014-12-07: 5 [IU] via SUBCUTANEOUS
  Administered 2014-12-07: 2 [IU] via SUBCUTANEOUS
  Administered 2014-12-07: 3 [IU] via SUBCUTANEOUS
  Administered 2014-12-07: 2 [IU] via SUBCUTANEOUS
  Administered 2014-12-08: 3 [IU] via SUBCUTANEOUS
  Administered 2014-12-08: 2 [IU] via SUBCUTANEOUS

## 2014-12-04 MED ORDER — METOPROLOL TARTRATE 1 MG/ML IV SOLN: 2.5000 mg | Freq: Four times a day (QID) | INTRAVENOUS | Status: DC

## 2014-12-04 MED ORDER — MIDAZOLAM HCL 2 MG/2ML IJ SOLN
1.0000 mg | INTRAMUSCULAR | Status: DC | PRN
  Administered 2014-12-04: 2 mg via INTRAVENOUS
  Filled 2014-12-04: qty 2

## 2014-12-04 MED ORDER — CARVEDILOL 6.25 MG PO TABS
6.2500 mg | ORAL_TABLET | Freq: Two times a day (BID) | ORAL | Status: DC
  Filled 2014-12-04 (×3): qty 1

## 2014-12-04 MED ORDER — METHOCARBAMOL 500 MG PO TABS
500.0000 mg | ORAL_TABLET | Freq: Four times a day (QID) | ORAL | Status: DC | PRN
  Administered 2014-12-07: 500 mg via ORAL
  Filled 2014-12-04 (×3): qty 1

## 2014-12-04 MED ORDER — AMLODIPINE BESYLATE 10 MG PO TABS: 10.0000 mg | ORAL_TABLET | Freq: Every day | ORAL | Status: DC

## 2014-12-04 MED ORDER — FERROUS SULFATE 325 (65 FE) MG PO TABS
325.0000 mg | ORAL_TABLET | Freq: Three times a day (TID) | ORAL | Status: DC
  Administered 2014-12-04 – 2014-12-09 (×14): 325 mg via ORAL
  Filled 2014-12-04 (×24): qty 1

## 2014-12-04 MED ORDER — HYDROCODONE-ACETAMINOPHEN 5-325 MG PO TABS: 1.0000 | ORAL_TABLET | Freq: Four times a day (QID) | ORAL | Status: DC | PRN

## 2014-12-04 MED ORDER — FENTANYL CITRATE (PF) 100 MCG/2ML IJ SOLN
INTRAMUSCULAR | Status: AC
  Administered 2014-12-05: 50 ug via INTRAVENOUS
  Filled 2014-12-04: qty 4

## 2014-12-04 MED ORDER — ROCURONIUM BROMIDE 50 MG/5ML IV SOLN
INTRAVENOUS | Status: AC
  Filled 2014-12-04: qty 2

## 2014-12-04 MED ORDER — ATORVASTATIN CALCIUM 20 MG PO TABS
20.0000 mg | ORAL_TABLET | Freq: Every day | ORAL | Status: DC
  Filled 2014-12-04: qty 1

## 2014-12-04 MED ORDER — FUROSEMIDE 10 MG/ML IJ SOLN
120.0000 mg | INTRAVENOUS | Status: AC
  Administered 2014-12-04: 120 mg via INTRAVENOUS
  Filled 2014-12-04: qty 12

## 2014-12-04 MED ORDER — LINACLOTIDE 145 MCG PO CAPS
145.0000 ug | ORAL_CAPSULE | Freq: Every day | ORAL | Status: DC | PRN
  Administered 2014-12-08: 145 ug via ORAL
  Filled 2014-12-04 (×5): qty 1

## 2014-12-04 MED ORDER — ENSURE ENLIVE PO LIQD
237.0000 mL | Freq: Two times a day (BID) | ORAL | Status: DC
  Administered 2014-12-06 – 2014-12-08 (×4): 237 mL via ORAL

## 2014-12-04 MED ORDER — HYDRALAZINE HCL 25 MG PO TABS
25.0000 mg | ORAL_TABLET | Freq: Three times a day (TID) | ORAL | Status: DC
  Filled 2014-12-04 (×4): qty 1

## 2014-12-04 MED ORDER — CETYLPYRIDINIUM CHLORIDE 0.05 % MT LIQD
7.0000 mL | Freq: Four times a day (QID) | OROMUCOSAL | Status: DC
  Administered 2014-12-04 – 2014-12-09 (×19): 7 mL via OROMUCOSAL

## 2014-12-04 MED ORDER — FENTANYL CITRATE (PF) 100 MCG/2ML IJ SOLN
50.0000 ug | INTRAMUSCULAR | Status: DC | PRN
  Administered 2014-12-05 (×4): 50 ug via INTRAVENOUS
  Administered 2014-12-09: 25 ug via INTRAVENOUS
  Filled 2014-12-04 (×5): qty 2

## 2014-12-04 MED ORDER — CHLORHEXIDINE GLUCONATE 0.12 % MT SOLN
15.0000 mL | Freq: Two times a day (BID) | OROMUCOSAL | Status: DC
  Administered 2014-12-04 – 2014-12-08 (×8): 15 mL via OROMUCOSAL
  Filled 2014-12-04 (×8): qty 15

## 2014-12-04 MED ORDER — MORPHINE SULFATE 2 MG/ML IJ SOLN
0.5000 mg | INTRAMUSCULAR | Status: DC | PRN
  Filled 2014-12-04: qty 1

## 2014-12-04 MED ORDER — HYDRALAZINE HCL 20 MG/ML IJ SOLN: 10.0000 mg | INTRAMUSCULAR | Status: DC | PRN

## 2014-12-04 MED ORDER — METHOCARBAMOL 1000 MG/10ML IJ SOLN
500.0000 mg | Freq: Four times a day (QID) | INTRAVENOUS | Status: DC | PRN
  Filled 2014-12-04: qty 5

## 2014-12-04 MED ORDER — PANTOPRAZOLE SODIUM 40 MG IV SOLR
40.0000 mg | Freq: Every day | INTRAVENOUS | Status: DC
  Administered 2014-12-04 – 2014-12-05 (×2): 40 mg via INTRAVENOUS
  Filled 2014-12-04 (×4): qty 40

## 2014-12-04 MED ORDER — MIDAZOLAM HCL 2 MG/2ML IJ SOLN
INTRAMUSCULAR | Status: AC
  Filled 2014-12-04: qty 4

## 2014-12-04 MED ORDER — DEXMEDETOMIDINE HCL IN NACL 200 MCG/50ML IV SOLN
0.0000 ug/kg/h | INTRAVENOUS | Status: AC
  Administered 2014-12-04 (×3): 0.4 ug/kg/h via INTRAVENOUS
  Administered 2014-12-05: 0.6 ug/kg/h via INTRAVENOUS
  Administered 2014-12-05: 0.4 ug/kg/h via INTRAVENOUS
  Administered 2014-12-05: 0.6 ug/kg/h via INTRAVENOUS
  Administered 2014-12-06: 0.9 ug/kg/h via INTRAVENOUS
  Administered 2014-12-06: 0.7 ug/kg/h via INTRAVENOUS
  Administered 2014-12-06: 0.6 ug/kg/h via INTRAVENOUS
  Administered 2014-12-06: 0.7 ug/kg/h via INTRAVENOUS
  Administered 2014-12-07 (×2): 1.2 ug/kg/h via INTRAVENOUS
  Filled 2014-12-04 (×13): qty 50

## 2014-12-04 MED ORDER — CETYLPYRIDINIUM CHLORIDE 0.05 % MT LIQD: 7.0000 mL | Freq: Four times a day (QID) | OROMUCOSAL | Status: DC

## 2014-12-04 MED ORDER — FENTANYL CITRATE (PF) 100 MCG/2ML IJ SOLN
25.0000 ug | INTRAMUSCULAR | Status: DC | PRN
  Administered 2014-12-04: 50 ug via INTRAVENOUS
  Filled 2014-12-04: qty 2

## 2014-12-04 MED ORDER — HYDRALAZINE HCL 20 MG/ML IJ SOLN: 10.0000 mg | Freq: Four times a day (QID) | INTRAMUSCULAR | Status: DC | PRN

## 2014-12-04 MED ORDER — VITAL AF 1.2 CAL PO LIQD
1000.0000 mL | ORAL | Status: DC
  Administered 2014-12-04 – 2014-12-06 (×3): 1000 mL
  Filled 2014-12-04 (×8): qty 1000

## 2014-12-04 NOTE — Progress Notes (Signed)
258mL Fentanyl gtt wasted in the sink. Conley Simmonds, RN witnessed.

## 2014-12-04 NOTE — Progress Notes (Signed)
Echocardiogram 2D Echocardiogram has been performed.  Monica Rhodes 12/04/2014, 12:25 PM

## 2014-12-04 NOTE — Progress Notes (Signed)
Orthopedic Tech Progress Note Patient Details:  Monica Rhodes 10-Jan-1928 [id number]  Patient ID: Monica Rhodes, female   DOB: Jun 21, 1927, 81 y.o.   MRN: [id number]   Irish Elders 12/04/2014, 8:52 AMunable to use devices

## 2014-12-04 NOTE — Progress Notes (Addendum)
Just seen as consult a little earlier this morning. A fibrillation is chronic and the rate is poorly controlled with VR 115 bpm. CXR suggests CHF with effusions and edema.  Impression:   If needed digoxin can be added for rate control if low BP. If BP okay, IV beta blocker more appropriate. Avoid diltiazem.  Urine output is low.  Pre-operative risk assessment suggest high risk for CV complications with surgery given current poor clinical state.   Plan:  IV dig or beta blocker(preferable as on chronic beta blocker at home) for rate control id needed  IV hydralazine as need for BP control  IV diuresis as tolerated.

## 2014-12-04 NOTE — Progress Notes (Signed)
Initial Nutrition Assessment  DOCUMENTATION CODES:  Severe malnutrition in context of chronic illness  INTERVENTION:   Initiate TF via OGT with Vital AF 1.2 at 25 ml/h and Prostat 30 ml BID on day 1; on day 2, d/c Prostat and increase to goal rate of 45 ml/h (1080 ml per day) to provide 1296 kcals, 81 gm protein, 876 ml free water daily.  NUTRITION DIAGNOSIS:  Malnutrition related to chronic illness as evidenced by severe depletion of body fat, severe depletion of muscle mass.  GOAL:  Patient will meet greater than or equal to 90% of their needs  MONITOR:  TF tolerance, Vent status, Weight trends, Labs  REASON FOR ASSESSMENT:  Consult Hip fracture protocol  ASSESSMENT:  Patient brought to [facility] ED on 6/23 with right hip pain after mechanical fall. Found to have displaced intertrochanteric fx. Transferred to [facility], developed hypoxia, and required intubation on 6/24. Hx includes HTN, HF, COPD, PVD, PAD, PCM and colostomy.   Discussed patient in ICU rounds and with RN today. Plans for hip surgery on Sunday, okay to begin TF today. Nutrition-Focused physical exam completed. Findings are severe fat depletion, severe muscle depletion, and moderate edema. Per discussion with family, patient was preparing a meal when she fell; she usually eats well and weight has been stable.  Patient is currently intubated on ventilator support MV: 7.6 L/min Temp (24hrs), Avg:98.2 F (36.8 C), Min:97.5 F (36.4 C), Max:99.5 F (37.5 C)  Propofol: none   Height:  Ht Readings from Last 1 Encounters:  12/04/14 5\' 1" (1.549 m)    Weight:  Wt Readings from Last 1 Encounters:  12/04/14 126 lb 1.7 oz (57.2 kg)    Ideal Body Weight:  47.7 kg  Wt Readings from Last 10 Encounters:  12/04/14 126 lb 1.7 oz (57.2 kg)  09/30/14 124 lb (56.246 kg)  09/04/14 126 lb 12.8 oz (57.516 kg)  08/17/14 118 lb (53.524 kg)  08/11/14 117 lb 1 oz (53.1 kg)  04/18/12 124 lb 4.8 oz (56.382 kg)  11/28/11 119 lb  12.8 oz (54.341 kg)  08/01/11 119 lb (53.978 kg)  06/09/11 125 lb (56.7 kg)  04/27/11 135 lb 12.9 oz (61.6 kg)    BMI:  Body mass index is 23.84 kg/(m^2).  Estimated Nutritional Needs:  Kcal:  1208  Protein:  80-95 gm  Fluid:  1.5 L  Skin:  Wound (see comment) (stage II pressure ulcer to coccyx)  Diet Order:  Diet NPO time specified  EDUCATION NEEDS:  Education needs no appropriate at this time   Intake/Output Summary (Last 24 hours) at 12/04/14 1400 Last data filed at 12/04/14 1300  Gross per 24 hour  Intake 524.89 ml  Output     80  ml  Net 444.89 ml    Last BM:  6/24   Molli Barrows, RD, LDN, [place] Pager [phone] After Hours Pager [phone]

## 2014-12-04 NOTE — Progress Notes (Signed)
[facility] hospitalist progress note. Chief complaint. Transfer note. History of present illness. This 81 year old female presented to [facility] long emergency room status post fall complaining of hip pain. Emergency room she was noted to be in atrial fib with RVR. The patient to the was requested to transfer to [facility] and she is now arrived. I'm seeing her at bedside to ensure she remains clinically stable post transfer orders transferred here appropriately. The patient's O2 sats have declined on nasal cannula oxygen and prior to transfer she was placed on BiPAP. Patient remains in atrial fib and the admitting note requests I notify cardiology for consult. Physical exam. Vital signs. Temperature 97.5, pulse 120s, respiration 20s, blood pressure 135/46. O2 sats 97%. General appearance. Frail elderly female who is alert and in no distress. Cardiac. Irregular and tachycardic. Lungs. Breath sounds are clear but somewhat reduced in the bases. She appears clinically stable on BiPAP. Abdomen. Soft with positive bowel sounds. Patient does have an ostomy site with the stoma pink in color. Impression/plan. Problem #1. Right hip fracture. Surgery planned for tomorrow. Orthopedics is aware of patient transfer. Problem #2. Hypertension. I will add when necessary hydralazine given patient nothing by mouth with BiPAP. Problem #3. Diabetes. Minor with sliding-scale coverage. Problem #4. Chronic kidney disease stage III. Follow labs. Problem #5. COPD. X-ray indicated edema. Patient status post Lasix. Continues on BiPAP. I will check ABG. Problem #6. Atrial fib. I did notify cardiology of patient's arrival and they will see and consult. Problem #7. CHF. Patient status post Lasix. We'll follow respiratory status. We'll follow diuresis. Problem #8. Hyperlipidemia. On statins. Problem #9  Ostomy. Patient with history of colon cancer. Patient appears clinically stable post transfer. All orders appear to have  transferred appropriately.

## 2014-12-04 NOTE — Consult Note (Addendum)
[facility] ostomy consult note Stoma type/location:  Colostomy has been present for several years, according to the EMR.  Pt has not required an ostomy consult during previous admissions, but is intubated and without supplies at this time. Stomal assessment/size: Stoma red and viable, flush with skin level, 3/4 inch. Peristomal assessment: Intact skin surrounding. Peristomal hernia visible. Output  50cc semi-formed brown stool Ostomy pouching: 1pc. convex Education provided: Pt is intubated and sedated at this time, no family members present. Pt has been wearing a convex one piece pouch. New pouch applied and supplies ordered to bedside for staff nurse use. Please re-consult if further assistance is needed.  Thank-you,  Julien Girt MSN, [place], [place], [place], [place]

## 2014-12-04 NOTE — Procedures (Signed)
Intubation Procedure Note Monica Rhodes [id number] January 19, 1928  Procedure: Intubation Indications: Respiratory insufficiency  Procedure Details Consent: Risks of procedure as well as the alternatives and risks of each were explained to the (patient/caregiver).  Consent for procedure obtained. Time Out: Verified patient identification, verified procedure, site/side was marked, verified correct patient position, special equipment/implants available, medications/allergies/relevent history reviewed, required imaging and test results available.  Performed  Maximum sterile technique was used including gloves, gown, hand hygiene and mask.  MAC and 3    Evaluation Hemodynamic Status: BP stable throughout; O2 sats: stable throughout Patient's Current Condition: stable Complications: No apparent complications Patient did tolerate procedure well. Chest X-ray ordered to verify placement.  CXR: pending.   Monica Rhodes. 12/04/2014  7.0  Tolerated well Failed NIMV  Monica Rhodes. Titus Mould, MD, [place] Pgr: [facility]

## 2014-12-04 NOTE — Progress Notes (Signed)
CRITICAL VALUE ALERT  Critical value received:  PCO2=75  Date of notification:  11/23/2014  Time of notification:  0130  Critical value read back:Yes.    Nurse who received alert:  Amedeo Plenty, RN  MD notified (1st page):  Rogue Bussing NP  Time of first page:  0135  MD notified (2nd page):  Time of second page:  Responding MD:   Time MD responded:

## 2014-12-04 NOTE — Progress Notes (Addendum)
Patient now intubated, in respiratory failure.  Not optimized for surgical fixation.  I have spoken with Dr. Nadara Mode, and we will plan tentatively for surgery on Sunday if her pulmonary status has improved adequately.  I will also speak with the family, and it will likely be Dr. Edmonia Lynch during surgery on Sunday if she is optimized.  Johnny Bridge, MD   I have called the son, Ron, but got his voicemail, and I left a HIPAA compliant voice message.

## 2014-12-04 NOTE — Clinical Social Work Note (Signed)
CSW consult acknowledged:  Clinical Education officer, museum received consult for SNF placement. CSW has reviewed chart and noted that psychosocial assessment was completed by [facility] CSW last night. Per [facility] CSW family is currently not interested in SNF placement and declined SNF information. Family reported that patient has a strong support system and that they will manage patient at home.   This CSW has met with pt's son, Monica Rhodes  With grandson present who reported he has been living with patient for the past 25 years. Monica Rhodes believes pt will benefit from continued therapy at SNF however advised CSW to contact pt's other son, Monica Rhodes to determine if family has had a change of heart in regards to discharge planning.   CSW will sign off for now however will remain available if pt's family is agreeable to SNF placement once pt is closer to being medically stable.    Glendon Axe, MSW, LCSWA [phone] 12/04/2014 12:45 PM

## 2014-12-04 NOTE — Consult Note (Addendum)
Referring Physician: No referring provider defined for this encounter. Primary Physician: Elyn Peers, MD Primary Cardiologist: Dr. Ron Parker Reason for Consultation: Afib with RVR, Acute pulmonary edema  HPI: Monica Rhodes is a 81 y.o. female with past medical history of CAD, COPD, peripheral vascular disease, systolic congestive heart failure with last LVF of 35%, moderate mitral regurgitation as well as large posterior pericardial effusion, chronic atrial fibrillation (nondominant coagulation),diabetes mellitus type 2, hypertension. Patient had a fall while she was walking with her walker in the kitchen. She did not lose consciousness. She did not have any chest pain and she did not have any seizure activity. She landed on her right side and sustained a Right intertrochanteric fracture. Patient was initially evaluated at [facility] long emergency department, where she received 1 L of normal saline and then subsequently 40 mg of IV Lasix, she was placed on BiPAP 12/6 and transferred to nose is calm for further orthopedic management. Cardiology were asked to assist with the management of A. fib RVR, congestive heart failure and perioperative evaluation.   Review of Systems:  Able to obtain adequate review of systems secondary to patient's critical condition, her being on BiPAP as well as very hard hearing    Past Medical History  Diagnosis Date  . Hypertension   . UTI (urinary tract infection)   . SBO (small bowel obstruction)     Recurrent, prolonged hospitalization November, 2012  . Protein calorie malnutrition   . Hyperlipidemia   . DM (diabetes mellitus)   . COPD (chronic obstructive pulmonary disease)   . PVD (peripheral vascular disease)   . Colon cancer   . Bradycardia   . Tobacco abuse   . CAD (coronary artery disease)     Non-STEMI November, 2012,, Cardiac catheterization was attempted, despite significant efforts catheters could not be progressed to the coronary arteries .,  Therefore medical therapy  . Carotid artery disease     Doppler  April 16, 2011,, total occlusion LICA, 85-63% R. ICA  . PAD (peripheral artery disease)     Suggestions significant stenosis distal aorta by CT angiogram  November, 2012  . Ejection fraction     EF 60%, echo, November, 2012,  . Mitral regurgitation     Moderate, echo, November, 2012  . Right ventricular dysfunction     Moderate, echo, November, 2012  . Edema     December, 2012  . Rash     Rash  probably related to hydralazine  December, 2012  . Hx of echocardiogram     Echo 3/16:  EF 35-40%, ant-lat HK, mod MR, mod LAE, mod to large pericardial effusion with no evidence of tamponade (unchanged from prior echo)  . PONV (postoperative nausea and vomiting)     post op confusion, combative  . CHF (congestive heart failure)    Past Surgical History  Procedure Laterality Date  . Colostomy    . Abdominoperineal proctocolectomy    . Left heart catheterization with coronary angiogram N/A 04/24/2011    Procedure: LEFT HEART CATHETERIZATION WITH CORONARY ANGIOGRAM;  Surgeon: Josue Hector, MD;  Location: [facility] CATH LAB;  Service: Cardiovascular;  Laterality: N/A;     Current Medications: . antiseptic oral rinse  7 mL Mouth Rinse q12n4p  . atorvastatin  20 mg Oral q1800  . carvedilol  6.25 mg Oral BID WC  .  ceFAZolin (ANCEF) IV  2 g Intravenous 30 min Pre-Op  . chlorhexidine  15 mL Mouth Rinse BID  . feeding supplement (  ENSURE ENLIVE)  237 mL Oral BID BM  . ferrous sulfate  325 mg Oral TID PC  . furosemide  120 mg Intravenous STAT  . furosemide  40 mg Oral Daily  . hydrALAZINE  25 mg Oral 3 times per day  . insulin aspart  0-15 Units Subcutaneous TID WC  . isosorbide mononitrate  30 mg Oral Daily   Infusions:    Prescriptions prior to admission  Medication Sig Dispense Refill Last Dose  . amLODipine (NORVASC) 10 MG tablet Take 1 tablet (10 mg total) by mouth daily. 90 tablet 1 11/13/2014 at 0700  . aspirin EC 81 MG  EC tablet Take 1 tablet (81 mg total) by mouth daily.   12/05/2014 at 0700  . atorvastatin (LIPITOR) 20 MG tablet Take 1 tablet (20 mg total) by mouth daily at 6 PM. 90 tablet 1 12/02/2014 at 2000  . carvedilol (COREG) 6.25 MG tablet Take 1 tablet (6.25 mg total) by mouth 2 (two) times daily with a meal. 180 tablet 1 11/27/2014 at 0700  . feeding supplement, ENSURE COMPLETE, (ENSURE COMPLETE) LIQD Take 237 mLs by mouth 2 (two) times daily between meals. 60 Bottle 0 12/06/2014 at Unknown time  . furosemide (LASIX) 40 MG tablet Take 40 mg BID until weight is 121 lbs then take 40 mg daily (Patient taking differently: Take 40 mg by mouth daily. ) 180 tablet 1 11/27/2014 at 0700  . hydrALAZINE (APRESOLINE) 25 MG tablet TAKE 1 TABLET (25 MG TOTAL) BY MOUTH 3 (THREE) TIMES DAILY. 90 tablet 1 12/09/2014 at 0700  . isosorbide mononitrate (IMDUR) 30 MG 24 hr tablet Take 1 tablet (30 mg total) by mouth daily. 90 tablet 1 12/07/2014 at 0700  . LINZESS 145 MCG CAPS capsule Take 145 mcg by mouth as needed (take as needed for IBS).   4 2 weeks  . nitroGLYCERIN (NITROSTAT) 0.4 MG SL tablet Place 1 tablet (0.4 mg total) under the tongue every 5 (five) minutes as needed. For chest pain 25 tablet 3 unknown     Allergies  Allergen Reactions  . Promethazine Other (See Comments)    Unknown     History   Social History  . Marital Status: Widowed    Spouse Name: N/A  . Number of Children: 5  . Years of Education: N/A   Occupational History  . Not on file.   Social History Main Topics  . Smoking status: Former Smoker -- 0.50 packs/day    Types: Cigarettes    Quit date: 06/12/2014  . Smokeless tobacco: Not on file  . Alcohol Use: No  . Drug Use: No  . Sexual Activity: Not Currently   Other Topics Concern  . Not on file   Social History Narrative    Family History  Problem Relation Age of Onset  . Coronary artery disease    . Heart attack Mother   . Hypertension Mother   . Hypertension Father   .  Hypertension Sister   . Stroke Neg Hx    Family Status  Relation Status Death Age  . Mother Deceased   . Father Deceased     PHYSICAL EXAM: Filed Vitals:   12/04/14 0045  BP: 125/64  Pulse: 69  Temp:   Resp: 21     Intake/Output Summary (Last 24 hours) at 12/04/14 0154 Last data filed at 12/04/14 0100  Gross per 24 hour  Intake    375 ml  Output     30 ml  Net  345 ml    General:  Tachypneic on BiPAP HEENT: normal Neck: supple. Serially distended neck veins. No lymphadenopathy or thryomegaly appreciated. Cor: PMI nondisplaced. Regular rate & rhythm. No rubs, gallops or murmurs. Lungs: Bilateral coarse breath sounds with occasional wheezing and crackles Abdomen: soft, nontender, nondistended. No hepatosplenomegaly. No bruits or masses. Good bowel sounds. Extremities: 2+ edema Neuro: Very hard hearing, not able to provide history, grossly nonfocal  End of care: LUNGS: Lateral pleural effusions right more than left, bilateral B lines anteriorly Heart: Severely decreased LVEF, posterior pericardial effusion, dilated inferior vena cava with decreased respiratory variation while off BiPAP  Results for orders placed or performed during the hospital encounter of 11/15/2014 (from the past 24 hour(s))  Type and screen     Status: None   Collection Time: 11/18/2014  7:00 PM  Result Value Ref Range   ABO/RH(D) O POS    Antibody Screen NEG    Sample Expiration 63/06/6008   Basic metabolic panel     Status: Abnormal   Collection Time: 11/28/2014  7:03 PM  Result Value Ref Range   Sodium 143 135 - 145 mmol/L   Potassium 3.9 3.5 - 5.1 mmol/L   Chloride 96 (L) 101 - 111 mmol/L   CO2 37 (H) 22 - 32 mmol/L   Glucose, Bld 173 (H) 65 - 99 mg/dL   BUN 53 (H) 6 - 20 mg/dL   Creatinine, Ser 1.31 (H) 0.44 - 1.00 mg/dL   Calcium 8.5 (L) 8.9 - 10.3 mg/dL   GFR calc non Af Amer 36 (L) >60 mL/min   GFR calc Af Amer 42 (L) >60 mL/min   Anion gap 10 5 - 15  CBC WITH DIFFERENTIAL     Status:  Abnormal   Collection Time: 11/20/2014  7:03 PM  Result Value Ref Range   WBC 7.3 4.0 - 10.5 K/uL   RBC 3.53 (L) 3.87 - 5.11 MIL/uL   Hemoglobin 9.6 (L) 12.0 - 15.0 g/dL   HCT 32.7 (L) 36.0 - 46.0 %   MCV 92.6 78.0 - 100.0 fL   MCH 27.2 26.0 - 34.0 pg   MCHC 29.4 (L) 30.0 - 36.0 g/dL   RDW 13.8 11.5 - 15.5 %   Platelets 168 150 - 400 K/uL   Neutrophils Relative % 72 43 - 77 %   Neutro Abs 5.3 1.7 - 7.7 K/uL   Lymphocytes Relative 16 12 - 46 %   Lymphs Abs 1.1 0.7 - 4.0 K/uL   Monocytes Relative 11 3 - 12 %   Monocytes Absolute 0.8 0.1 - 1.0 K/uL   Eosinophils Relative 1 0 - 5 %   Eosinophils Absolute 0.1 0.0 - 0.7 K/uL   Basophils Relative 0 0 - 1 %   Basophils Absolute 0.0 0.0 - 0.1 K/uL  Protime-INR     Status: None   Collection Time: 11/14/2014  7:03 PM  Result Value Ref Range   Prothrombin Time 13.6 11.6 - 15.2 seconds   INR 1.02 0.00 - 1.49  ABO/Rh     Status: None   Collection Time: 11/15/2014  7:05 PM  Result Value Ref Range   ABO/RH(D) O POS   Brain natriuretic peptide     Status: Abnormal   Collection Time: 11/26/2014  7:14 PM  Result Value Ref Range   B Natriuretic Peptide 766.5 (H) 0.0 - 100.0 pg/mL  Urinalysis, Routine w reflex microscopic (not at [facility])     Status: Abnormal   Collection Time: 11/25/2014  8:50  PM  Result Value Ref Range   Color, Urine YELLOW YELLOW   APPearance CLOUDY (A) CLEAR   Specific Gravity, Urine 1.013 1.005 - 1.030   pH 5.5 5.0 - 8.0   Glucose, UA NEGATIVE NEGATIVE mg/dL   Hgb urine dipstick NEGATIVE NEGATIVE   Bilirubin Urine NEGATIVE NEGATIVE   Ketones, ur NEGATIVE NEGATIVE mg/dL   Protein, ur 100 (A) NEGATIVE mg/dL   Urobilinogen, UA 0.2 0.0 - 1.0 mg/dL   Nitrite NEGATIVE NEGATIVE   Leukocytes, UA NEGATIVE NEGATIVE  Urine microscopic-add on     Status: Abnormal   Collection Time: 12/04/2014  8:50 PM  Result Value Ref Range   Casts HYALINE CASTS (A) NEGATIVE   Urine-Other AMORPHOUS URATES/PHOSPHATES   Blood gas, arterial     Status:  Abnormal   Collection Time: 12/04/14  1:12 AM  Result Value Ref Range   FIO2 1.00 %   Delivery systems BILEVEL POSITIVE AIRWAY PRESSURE    Inspiratory PAP 12    Expiratory PAP 6    pH, Arterial 7.272 (L) 7.350 - 7.450   pCO2 arterial 74.6 (HH) 35.0 - 45.0 mmHg   pO2, Arterial 71.8 (L) 80.0 - 100.0 mmHg   Bicarbonate 33.3 (H) 20.0 - 24.0 mEq/L   TCO2 35.6 0 - 100 mmol/L   Acid-Base Excess 6.7 (H) 0.0 - 2.0 mmol/L   O2 Saturation 91.8 %   Patient temperature 98.6    Collection site RIGHT RADIAL    Drawn by [id number]    Sample type ARTERIAL DRAW    Allens test (pass/fail) PASS PASS   Radiology:  Dg Chest 1 View  11/30/2014   CLINICAL DATA:  Shortness of breath.  EXAM: CHEST  1 VIEW  COMPARISON:  August 10, 2014.  FINDINGS: Stable cardiomegaly. No pneumothorax is noted. Increased interstitial and airspace opacities are noted in both lungs concerning for pneumonia or edema. Stable bilateral pleural effusions are noted. Bony thorax is intact.  IMPRESSION: Increased bilateral lung opacities are noted concerning for pneumonia or edema. Stable cardiomegaly and bilateral pleural effusions are noted.   Electronically Signed   By: Marijo Conception, M.D.   On: 11/24/2014 20:38   Ct Head Wo Contrast  11/11/2014   CLINICAL DATA:  Status post fall today.  EXAM: CT HEAD WITHOUT CONTRAST  CT CERVICAL SPINE WITHOUT CONTRAST  TECHNIQUE: Multidetector CT imaging of the head and cervical spine was performed following the standard protocol without intravenous contrast. Multiplanar CT image reconstructions of the cervical spine were also generated.  COMPARISON:  Head CT scan 11/30/2005.  FINDINGS: CT HEAD FINDINGS  Chronic microvascular ischemic change is identified. There is no evidence of acute intracranial abnormality including hemorrhage, infarct, mass lesion, mass effect, midline shift or abnormal extra-axial fluid collection. The calvarium is intact. Imaged paranasal sinuses and mastoid air cells are clear.  CT  CERVICAL SPINE FINDINGS  No cervical spine fracture is seen. 0.3 cm anterolisthesis C3 on C4 is due to facet arthropathy. There is marked loss of disc space height and endplate spurring at V9-5 and C6-7. Lung apices demonstrate partial visualization of layering bilateral pleural effusions.  IMPRESSION: No acute abnormality head or cervical spine.  Layering bilateral pleural effusions.  Atrophy and chronic microvascular ischemic change.  Cervical spondylosis.   Electronically Signed   By: Inge Rise M.D.   On: 11/21/2014 19:40   Ct Cervical Spine Wo Contrast  12/05/2014   CLINICAL DATA:  Status post fall today.  EXAM: CT HEAD WITHOUT CONTRAST  CT  CERVICAL SPINE WITHOUT CONTRAST  TECHNIQUE: Multidetector CT imaging of the head and cervical spine was performed following the standard protocol without intravenous contrast. Multiplanar CT image reconstructions of the cervical spine were also generated.  COMPARISON:  Head CT scan 11/30/2005.  FINDINGS: CT HEAD FINDINGS  Chronic microvascular ischemic change is identified. There is no evidence of acute intracranial abnormality including hemorrhage, infarct, mass lesion, mass effect, midline shift or abnormal extra-axial fluid collection. The calvarium is intact. Imaged paranasal sinuses and mastoid air cells are clear.  CT CERVICAL SPINE FINDINGS  No cervical spine fracture is seen. 0.3 cm anterolisthesis C3 on C4 is due to facet arthropathy. There is marked loss of disc space height and endplate spurring at J4-9 and C6-7. Lung apices demonstrate partial visualization of layering bilateral pleural effusions.  IMPRESSION: No acute abnormality head or cervical spine.  Layering bilateral pleural effusions.  Atrophy and chronic microvascular ischemic change.  Cervical spondylosis.   Electronically Signed   By: Inge Rise M.D.   On: 12/01/2014 19:40   Dg Knee Complete 4 Views Right  12/02/2014   CLINICAL DATA:  Fall at home.  Right knee pain.  Right hip  fracture  EXAM: RIGHT KNEE - COMPLETE 4+ VIEW  COMPARISON:  Pelvic radiograph same day  FINDINGS: There is narrowing of the medial compartment and lateral compartment. No acute fracture. No joint effusion. Atherosclerotic calcification  IMPRESSION: No acute findings of the right knee.   Electronically Signed   By: Suzy Bouchard M.D.   On: 11/16/2014 20:38   Dg Hip Unilat  With Pelvis 2-3 Views Right  11/11/2014   CLINICAL DATA:  Status post fall today. Right hip pain. Initial encounter.  EXAM: RIGHT HIP (WITH PELVIS) 2-3 VIEWS  COMPARISON:  None.  FINDINGS: The patient has an acute right intertrochanteric fracture. The lesser trochanter is a separate fragment. No other acute bony or joint abnormality is identified.  IMPRESSION: Acute right intertrochanteric fracture.   Electronically Signed   By: Inge Rise M.D.   On: 11/21/2014 18:52    ECG 12/02/2014: Afib with RVR 113 bpm, lateral ST depressions likely secondary to LVH, can not exclude ischemia  ECHO 09/03/2014: - Left ventricle: The cavity size was normal. Wall thickness was normal. Systolic function was moderately reduced. The estimated ejection fraction was in the range of 35% to 40%. There is hypokinesis of the anterolateral myocardium. - Mitral valve: There was moderate regurgitation. - Left atrium: The atrium was moderately dilated. - Pericardium, extracardiac: A moderate to large pericardial effusion was identified posterior to the heart and along the right ventricular free wall. There was no definitive tamponade physiology.  ASSESSMENT: # Acute hypoxemic and hypercarbic respiratory failure  # Acute systolic and diastolic congestive heart failure with acute pulmonary edema and bilateral pleural effusions  # Pericardial effusion # COPD # Preoperative cardiac evaluation # Chronic atrial fibrillation now in RVR  DISCUSSION: Patient is currently in significant respiratory failure as well as congestive heart failure requiring  continuous noninvasive positive pressure ventilation. Her atrial fibrillation which is chronic is currently not will rate control however I would prefer not to aggressively  rate control her given acute heart failure exacerbation and the fact that to some extent RVR is likely driven by CHF and pain.   Patient is at very high risk of any surgical intervention right now due to active cardiac condition - not stable for surgical intervention.   Plan/recommendations: - TSH ordered (I wonder in this patient has hypothyroidism  and resulting pericardial effusion) - Repeat echocardiogram ordered - Lasix 120 mg IV times one ordered - recheck ABG - amlodipine was discontinued due to relatively low blood pressure - BiPAP adjusted from 12/6 to 16/8, FiO2 was weaned from 100% to 60%, will titrate to keep saturation above 92%   Addendum: No UOP despite 120 mg of IV lasix. Bladder scan with no urine in the bladder, foley was flushed. No urine.  Pt likely has ATN. Her perfusion appears to be reasonable. No clinical signs of low output. She she quite volume overloaded.  ABG with improved oxygenation but with persistent CO2 retention and still respiratory acidosis.  - Will give diuril 500 mg IV x 1 - CBC, CMP, TSH ordered.  Critical care time for documentation and providing care to this patient the bedside 60 minutes (CPT 99291)    Inez Pilgrim, MD 12/04/2014 1:54 AM

## 2014-12-04 NOTE — Consult Note (Signed)
Name: Monica Rhodes MRN: [id number] DOB: [birth year]    ADMISSION DATE:  11/14/2014 CONSULTATION DATE:  12/04/2014  REFERRING MD :  Humphrey Rolls  CHIEF COMPLAINT:  Hypercarbic respiratory failure  BRIEF PATIENT DESCRIPTION: 81 y.o. F brought to [facility] ED 6/23 with R hip pain after mechanical fall.  Found to have displaced intertrochanteric fx.  Transferred to [facility] at request of ortho MD.  Developed hypoxia and tachypnea so placed on BiPAP.  ABG revealed hypercarbic and hypoxic resp failure; after settings adjusted, hypercarbia not resolved so PCCM called for further recs.  SIGNIFICANT EVENTS  6/23 - admitted with right intertrochanteric fx. 6/24 - PCCM consulted for acute on chronic hypercapnic resp failure.  STUDIES:  CXR 6/23 >>> b/l opacities, pna vs edema.  Stable cardiomegaly and b/l pleural effusions. R Knee 6/23 >>> no acute findings. R hip 6/23 >>> acute right intertrochanteric fx. CT head / cspine 6/23 >>> no acute process.   HISTORY OF PRESENT ILLNESS:  Pt is currently on BiPAP; therefore, she is not able to speak and contribute to hx.  This HPI is obtained from chart review.  Monica Rhodes is a 81 y.o. F with PMH as outlined below.  She presented to [facility] ED 6/23 with hip pain after suffering a mechanical fall while walking with her walker in her kitchen earlier that evening.  There was no LOC and pt did not hit her head but instead, her right side is what took the force / impact of teh fall.   Imaging in ED revealed a displaced right intertrochanteric fracture.  She was evaluated by ortho (Dr. Mardelle Matte) and after discussion with family regarding surgical vs. Non-surgical options, family has opted for surgery.  Surgery was posted for 6/24 and pt was transferred to [facility]. Prior to transfer, she had tachypnea and developed RVR (has chronic A.fib).  She also desaturated into 80's on Tiro so was started on BiPAP.  Once at [facility], she was continued on BiPAP.  An ABG was obtained by primary team which  revealed hypercarbic respiratory failure (7.27 / 75 / 72).  She was evaluated by cardiology who ordered 120mg  lasix which only resulted in 54ml of UOP.  Cardiology did not recommend aggressive rate control given her acute heart failure exacerbation and the fact that some of her tachycardia was likely exacerbated by pain.  Settings on BiPAP were changed by cards and follow up ABG was basically unchanged (7.28 / 73 / 100).  PCCM was consulted for further recs.  Ortho is planning to take her to the OR today 6/24 for ORIF right hip fx.  PAST MEDICAL HISTORY :   has a past medical history of Hypertension; UTI (urinary tract infection); SBO (small bowel obstruction); Protein calorie malnutrition; Hyperlipidemia; DM (diabetes mellitus); COPD (chronic obstructive pulmonary disease); PVD (peripheral vascular disease); Colon cancer; Bradycardia; Tobacco abuse; CAD (coronary artery disease); Carotid artery disease; PAD (peripheral artery disease); Ejection fraction; Mitral regurgitation; Right ventricular dysfunction; Edema; Rash; echocardiogram; PONV (postoperative nausea and vomiting); and CHF (congestive heart failure).  has past surgical history that includes Colostomy; Abdominoperineal proctocolectomy; and left heart catheterization with coronary angiogram (N/A, 04/24/2011). Prior to Admission medications   Medication Sig Start Date End Date Taking? Authorizing Provider  amLODipine (NORVASC) 10 MG tablet Take 1 tablet (10 mg total) by mouth daily. 09/04/14  Yes Carlena Bjornstad, MD  aspirin EC 81 MG EC tablet Take 1 tablet (81 mg total) by mouth daily. 08/11/14  Yes Debbe Odea, MD  atorvastatin (LIPITOR)  20 MG tablet Take 1 tablet (20 mg total) by mouth daily at 6 PM. 09/04/14  Yes Carlena Bjornstad, MD  carvedilol (COREG) 6.25 MG tablet Take 1 tablet (6.25 mg total) by mouth 2 (two) times daily with a meal. 09/04/14  Yes Carlena Bjornstad, MD  feeding supplement, ENSURE COMPLETE, (ENSURE COMPLETE) LIQD Take 237 mLs  by mouth 2 (two) times daily between meals. 08/11/14  Yes Debbe Odea, MD  furosemide (LASIX) 40 MG tablet Take 40 mg BID until weight is 121 lbs then take 40 mg daily Patient taking differently: Take 40 mg by mouth daily.  09/04/14  Yes Carlena Bjornstad, MD  hydrALAZINE (APRESOLINE) 25 MG tablet TAKE 1 TABLET (25 MG TOTAL) BY MOUTH 3 (THREE) TIMES DAILY. 11/02/14  Yes Carlena Bjornstad, MD  isosorbide mononitrate (IMDUR) 30 MG 24 hr tablet Take 1 tablet (30 mg total) by mouth daily. 09/04/14  Yes Carlena Bjornstad, MD  LINZESS 145 MCG CAPS capsule Take 145 mcg by mouth as needed (take as needed for IBS).  07/19/14  Yes Historical Provider, MD  nitroGLYCERIN (NITROSTAT) 0.4 MG SL tablet Place 1 tablet (0.4 mg total) under the tongue every 5 (five) minutes as needed. For chest pain 09/04/14 09/04/15 Yes Carlena Bjornstad, MD   Allergies  Allergen Reactions  . Promethazine Other (See Comments)    Unknown     FAMILY HISTORY:  family history includes Coronary artery disease in an other family member; Heart attack in her mother; Hypertension in her father, mother, and sister. There is no history of Stroke. SOCIAL HISTORY:  reports that she quit smoking about 5 months ago. Her smoking use included Cigarettes. She smoked 0.50 packs per day. She does not have any smokeless tobacco history on file. She reports that she does not drink alcohol or use illicit drugs.  REVIEW OF SYSTEMS:  Unable to obtain as pt is on BiPAP therefore not able to fully communicate appropriately.  SUBJECTIVE:  Complaining of right hip pain. Denies chest pain, SOB.  VITAL SIGNS: Temp:  [97.5 F (36.4 C)-97.7 F (36.5 C)] 97.7 F (36.5 C) (06/24 0326) Pulse Rate:  [50-149] 98 (06/24 0443) Resp:  [14-29] 19 (06/24 0443) BP: (94-144)/(46-70) 112/46 mmHg (06/24 0443) SpO2:  [81 %-99 %] 96 % (06/24 0443) FiO2 (%):  [100 %] 100 % (06/24 0028) Weight:  [55.792 kg (123 lb)-56.3 kg (124 lb 1.9 oz)] 56.3 kg (124 lb 1.9 oz) (06/24  0028)  PHYSICAL EXAMINATION: General: Elderly female, resting in bed, in NAD. Neuro: Sleepy but arouses to voice.  Attempts to communicate. HEENT: [place]/AT. PERRL, sclerae anicteric. BiPAP in place. Cardiovascular: IRIR, no M/R/G.  Lungs: Respirations even and unlabored.  CTA bilaterally, No W/R/R. Abdomen: BS x 4, soft, NT/ND.  Musculoskeletal: No gross deformities, 2+ pitting edema peripherally. Skin: Intact, warm, no rashes.   Recent Labs Lab 11/25/2014 1903  NA 143  K 3.9  CL 96*  CO2 37*  BUN 53*  CREATININE 1.31*  GLUCOSE 173*    Recent Labs Lab 11/13/2014 1903  HGB 9.6*  HCT 32.7*  WBC 7.3  PLT 168   Dg Chest 1 View  11/29/2014   CLINICAL DATA:  Shortness of breath.  EXAM: CHEST  1 VIEW  COMPARISON:  August 10, 2014.  FINDINGS: Stable cardiomegaly. No pneumothorax is noted. Increased interstitial and airspace opacities are noted in both lungs concerning for pneumonia or edema. Stable bilateral pleural effusions are noted. Bony thorax is intact.  IMPRESSION: Increased  bilateral lung opacities are noted concerning for pneumonia or edema. Stable cardiomegaly and bilateral pleural effusions are noted.   Electronically Signed   By: Marijo Conception, M.D.   On: 12/09/2014 20:38   Ct Head Wo Contrast  12/04/2014   CLINICAL DATA:  Status post fall today.  EXAM: CT HEAD WITHOUT CONTRAST  CT CERVICAL SPINE WITHOUT CONTRAST  TECHNIQUE: Multidetector CT imaging of the head and cervical spine was performed following the standard protocol without intravenous contrast. Multiplanar CT image reconstructions of the cervical spine were also generated.  COMPARISON:  Head CT scan 11/30/2005.  FINDINGS: CT HEAD FINDINGS  Chronic microvascular ischemic change is identified. There is no evidence of acute intracranial abnormality including hemorrhage, infarct, mass lesion, mass effect, midline shift or abnormal extra-axial fluid collection. The calvarium is intact. Imaged paranasal sinuses and mastoid  air cells are clear.  CT CERVICAL SPINE FINDINGS  No cervical spine fracture is seen. 0.3 cm anterolisthesis C3 on C4 is due to facet arthropathy. There is marked loss of disc space height and endplate spurring at Z1-2 and C6-7. Lung apices demonstrate partial visualization of layering bilateral pleural effusions.  IMPRESSION: No acute abnormality head or cervical spine.  Layering bilateral pleural effusions.  Atrophy and chronic microvascular ischemic change.  Cervical spondylosis.   Electronically Signed   By: Inge Rise M.D.   On: 12/01/2014 19:40   Ct Cervical Spine Wo Contrast  12/07/2014   CLINICAL DATA:  Status post fall today.  EXAM: CT HEAD WITHOUT CONTRAST  CT CERVICAL SPINE WITHOUT CONTRAST  TECHNIQUE: Multidetector CT imaging of the head and cervical spine was performed following the standard protocol without intravenous contrast. Multiplanar CT image reconstructions of the cervical spine were also generated.  COMPARISON:  Head CT scan 11/30/2005.  FINDINGS: CT HEAD FINDINGS  Chronic microvascular ischemic change is identified. There is no evidence of acute intracranial abnormality including hemorrhage, infarct, mass lesion, mass effect, midline shift or abnormal extra-axial fluid collection. The calvarium is intact. Imaged paranasal sinuses and mastoid air cells are clear.  CT CERVICAL SPINE FINDINGS  No cervical spine fracture is seen. 0.3 cm anterolisthesis C3 on C4 is due to facet arthropathy. There is marked loss of disc space height and endplate spurring at W5-8 and C6-7. Lung apices demonstrate partial visualization of layering bilateral pleural effusions.  IMPRESSION: No acute abnormality head or cervical spine.  Layering bilateral pleural effusions.  Atrophy and chronic microvascular ischemic change.  Cervical spondylosis.   Electronically Signed   By: Inge Rise M.D.   On: 11/25/2014 19:40   Dg Knee Complete 4 Views Right  11/17/2014   CLINICAL DATA:  Fall at home.  Right knee  pain.  Right hip fracture  EXAM: RIGHT KNEE - COMPLETE 4+ VIEW  COMPARISON:  Pelvic radiograph same day  FINDINGS: There is narrowing of the medial compartment and lateral compartment. No acute fracture. No joint effusion. Atherosclerotic calcification  IMPRESSION: No acute findings of the right knee.   Electronically Signed   By: Suzy Bouchard M.D.   On: 11/26/2014 20:38   Dg Hip Unilat  With Pelvis 2-3 Views Right  11/11/2014   CLINICAL DATA:  Status post fall today. Right hip pain. Initial encounter.  EXAM: RIGHT HIP (WITH PELVIS) 2-3 VIEWS  COMPARISON:  None.  FINDINGS: The patient has an acute right intertrochanteric fracture. The lesser trochanter is a separate fragment. No other acute bony or joint abnormality is identified.  IMPRESSION: Acute right intertrochanteric fracture.  Electronically Signed   By: Inge Rise M.D.   On: 11/20/2014 18:52    ASSESSMENT / PLAN:  PULMONARY OETT 6/24 >>>  A: Acute on chronic hypercapnic and hypoxemic respiratory failure. Pulmonary edema - s/p 120mg  lasix. Bilateral pleural effusions. Doubt PNA. COPD by report - no PFT's on file. P:   Intubate now in anticipation ORIF of R intertrochanteric fx. Full mechanical support. VAP bundle. SBT when able. Albuterol PRN. CXR in AM.  CARDIOVASCULAR A:  A.fib with RVR. Acute on chronic systolic heart failure - echo from March 2016 with EF 35 - 40%, mod MR, mod to large pericardial effusion (unchanged). Hx HLD, CAD, PAD, MR, known pericardial effusion. P:  Lopressor PRN. Check troponin, lactate. Hold outpatient amlodipine, carvedilol, hydralazine, imdur. D/c chlorothiazide. Echo.  RENAL A:   CKD III. P:   NS @ 75. BMP in AM.  GASTROINTESTINAL A:   GI prophylaxis. Nutrition. P:  SUP: Pantoprazole. NPO.  HEMATOLOGIC A:   VTE Prophylaxis. P:  SCD's. CBC in AM.  INFECTIOUS A:   No indication of infection. P:   Monitor clinically.  ENDOCRINE A:   DM.   P:    SSI. TSH.  NEUROLOGIC A:   Acute encephalopathy. P:   Sedation:  Fentanyl gtt. RASS goal: 0 to -1. Daily WUA.  MUSCULOSKELETAL A: Right intertrochanteric hip fx after mechanical fall. P: Ortho following, planning to take to OR today 6/24 for ORIF.   Family updated: Son over phone by RN.  Interdisciplinary Family Meeting v Palliative Care Meeting:  Due by: 6/30.   Montey Hora, [facility] Pager: [phone]  or [phone] 12/04/2014, 5:28 AM    STAFF NOTE: I, Merrie Roof, MD FACP have personally reviewed patient's available data, including medical history, events of note, physical examination and test results as part of my evaluation. I have discussed with resident/NP and other care providers such as pharmacist, RN and RRT. In addition, I personally evaluated patient and elicited key findings of: distress, coarse ronchi throughout, pcxr edema effusions, r/o patchy infil;trates, would cover PNA, failing nimv now, volume slow, poor fit, acifosis despite IPAP increase, intubate, 8 cc.kg plan, rate 20-22, repeat abg, rate overall is controlled, may need cardizem infusion as BP adaquate to support, OR schedule to be dtermined, assess trop furtherm lateral T wave changes, trop neg thus far, echo assessment, son updated by RN The patient is critically ill with multiple organ systems failure and requires high complexity decision making for assessment and support, frequent evaluation and titration of therapies, application of advanced monitoring technologies and extensive interpretation of multiple databases.   Critical Care Time devoted to patient care services described in this note is 30 Minutes. This time reflects time of care of this signee: Merrie Roof, MD FACP. This critical care time does not reflect procedure time, or teaching time or supervisory time of PA/NP/Med student/Med Resident etc but could involve care discussion  time. Rest per NP/medical resident whose note is outlined above and that I agree with   Lavon Paganini. Titus Mould, MD, [place] Pgr: [facility] 12/04/2014 6:18 AM

## 2014-12-05 ENCOUNTER — Inpatient Hospital Stay (HOSPITAL_COMMUNITY): Payer: Medicare Other

## 2014-12-05 LAB — GLUCOSE, CAPILLARY
GLUCOSE-CAPILLARY: 98 mg/dL (ref 65–99)
GLUCOSE-CAPILLARY: 154 mg/dL — AB (ref 65–99)
Glucose-Capillary: 132 mg/dL — ABNORMAL HIGH (ref 65–99)
Glucose-Capillary: 141 mg/dL — ABNORMAL HIGH (ref 65–99)
Glucose-Capillary: 144 mg/dL — ABNORMAL HIGH (ref 65–99)
Glucose-Capillary: 157 mg/dL — ABNORMAL HIGH (ref 65–99)

## 2014-12-05 LAB — POCT I-STAT 3, ART BLOOD GAS (G3+)
Acid-Base Excess: 7 mmol/L — ABNORMAL HIGH (ref 0.0–2.0)
Bicarbonate: 29.8 mEq/L — ABNORMAL HIGH (ref 20.0–24.0)
O2 Saturation: 98 %
PCO2 ART: 35.3 mmHg (ref 35.0–45.0)
PO2 ART: 87 mmHg (ref 80.0–100.0)
Patient temperature: 98.6
TCO2: 31 mmol/L (ref 0–100)
pH, Arterial: 7.535 — ABNORMAL HIGH (ref 7.350–7.450)

## 2014-12-05 LAB — HEMOGLOBIN A1C
HEMOGLOBIN A1C: 6.3 % — AB (ref 4.8–5.6)
Mean Plasma Glucose: 134 mg/dL

## 2014-12-05 LAB — PHOSPHORUS
PHOSPHORUS: 4.7 mg/dL — AB (ref 2.5–4.6)
Phosphorus: 4.7 mg/dL — ABNORMAL HIGH (ref 2.5–4.6)

## 2014-12-05 LAB — MAGNESIUM
MAGNESIUM: 1.9 mg/dL (ref 1.7–2.4)
Magnesium: 1.9 mg/dL (ref 1.7–2.4)

## 2014-12-05 MED ORDER — CEFTRIAXONE SODIUM IN DEXTROSE 20 MG/ML IV SOLN
1.0000 g | INTRAVENOUS | Status: DC
  Administered 2014-12-05 – 2014-12-08 (×4): 1 g via INTRAVENOUS
  Filled 2014-12-05 (×4): qty 50

## 2014-12-05 MED ORDER — FUROSEMIDE 10 MG/ML IJ SOLN
80.0000 mg | Freq: Once | INTRAMUSCULAR | Status: AC
  Administered 2014-12-05: 80 mg via INTRAVENOUS
  Filled 2014-12-05: qty 8

## 2014-12-05 MED ORDER — METOLAZONE 5 MG PO TABS
5.0000 mg | ORAL_TABLET | Freq: Once | ORAL | Status: AC
  Administered 2014-12-05: 5 mg via ORAL
  Filled 2014-12-05: qty 1

## 2014-12-05 NOTE — Progress Notes (Signed)
Patient ID: Monica Rhodes, female   DOB: [birth year], 81 y.o.   MRN: [id number]     Subjective:   No events overnight   Objective:   Temp:  [98.1 F (36.7 C)-100.1 F (37.8 C)] 99.6 F (37.6 C) (06/25 0750) Pulse Rate:  [37-113] 113 (06/25 0900) Resp:  [18-39] 22 (06/25 0900) BP: (91-128)/(40-73) 124/50 mmHg (06/25 0900) SpO2:  [97 %-100 %] 98 % (06/25 0900) FiO2 (%):  [30 %-45 %] 30 % (06/25 0955) Weight:  [132 lb 15 oz (60.3 kg)] 132 lb 15 oz (60.3 kg) (06/25 0500) Last BM Date: 12/04/14  Filed Weights   12/04/14 0028 12/04/14 0500 12/05/14 0500  Weight: 124 lb 1.9 oz (56.3 kg) 126 lb 1.7 oz (57.2 kg) 132 lb 15 oz (60.3 kg)    Intake/Output Summary (Last 24 hours) at 12/05/14 1135 Last data filed at 12/05/14 0930  Gross per 24 hour  Intake 2260.87 ml  Output    545 ml  Net 1715.87 ml    Telemetry: afib, rates controlled  Exam:  General: sedated, NAD  Resp: clear anteriorally  Cardiac: irreg, no m/r/g, mildly elevated JVD  GI: abdomen soft, NT, ND  MSK: 3+ bilateral LE edema  Neuro: sedated    Lab Results:  Basic Metabolic Panel:  Recent Labs Lab 11/18/2014 1903 12/04/14 0654 12/04/14 1535 12/05/14 0220  NA 143 142  --   --   K 3.9 4.5  --   --   CL 96* 98*  --   --   CO2 37* 34*  --   --   GLUCOSE 173* 142*  --   --   BUN 53* 49*  --   --   CREATININE 1.31* 1.61*  --   --   CALCIUM 8.5* 8.5*  --   --   MG  --   --  1.8 1.9    Liver Function Tests:  Recent Labs Lab 12/04/14 0654  AST 33  ALT 12*  ALKPHOS 61  BILITOT 0.5  PROT 6.5  ALBUMIN 3.1*    CBC:  Recent Labs Lab 11/20/2014 1903 12/04/14 0812  WBC 7.3 7.9  HGB 9.6* 9.8*  HCT 32.7* 33.2*  MCV 92.6 93.0  PLT 168 153    Cardiac Enzymes: No results for input(s): CKTOTAL, CKMB, CKMBINDEX, TROPONINI in the last 168 hours.  BNP: No results for input(s): PROBNP in the last 8760 hours.  Coagulation:  Recent Labs Lab 11/19/2014 1903  INR 1.02     ECG:   Medications:   Scheduled Medications: . antiseptic oral rinse  7 mL Mouth Rinse QID  . atorvastatin  20 mg Per Tube q1800  . cefTRIAXone (ROCEPHIN)  IV  1 g Intravenous Q24H  . chlorhexidine  15 mL Mouth Rinse BID  . feeding supplement (ENSURE ENLIVE)  237 mL Oral BID BM  . ferrous sulfate  325 mg Oral TID PC  . insulin aspart  0-15 Units Subcutaneous 6 times per day  . pantoprazole (PROTONIX) IV  40 mg Intravenous Daily     Infusions: . sodium chloride 75 mL/hr at 12/05/14 0800  . dexmedetomidine 0.5 mcg/kg/hr (12/05/14 0930)  . feeding supplement (VITAL AF 1.2 CAL) 1,000 mL (12/05/14 0800)  . fentaNYL infusion INTRAVENOUS Stopped (12/04/14 1245)     PRN Medications:  albuterol, fentaNYL, fentaNYL (SUBLIMAZE) injection, fentaNYL (SUBLIMAZE) injection, hydrALAZINE, Linaclotide, methocarbamol **OR** methocarbamol (ROBAXIN)  IV, metoprolol, midazolam, midazolam, nitroGLYCERIN, polyethylene glycol     Assessment/Plan   1. Acute systolic/diastolic  HF - 12/04/14 echo LVEF 20-25%, diffuse hypokinesis, moderate MR - LVEF decreased from 08/2014 echo (LVEF was 35-40% at that time) - CXR improved pleural effusions and pulm edema - + 1.5 liters yesterday, + 2.3 liters since admission. Received lasix 120mg  once yesterday early AM, fairly limited uop. Potentially component of ATN, she is warm with signs of reasonable perfusion. No central line to check CVP or Co-ox. Will give dose of metolazone followed by lasix today.    2. Pericardial effusion - echo shows moderate to large effusion without criteria for tamponade by echo, stable from prior study. 3 months ago.   3. Afib - from primary cardiologists note has not been a candidate for anticoagulation - rates currently 80-90s, currently on prn IV metoprolol  4. Hip fracture - possible surgery tomorrow, patient is high risk.    Carlyle Dolly, M.D.

## 2014-12-05 NOTE — Progress Notes (Signed)
Subjective: * Surgery Date in Future * Procedure(s) (LRB): INTRAMEDULLARY (IM) NAIL INTERTROCHANTRIC (Right)  Patient resting comfortably, son Fritz Pickerel at bedside.  Objective: Vital signs in last 24 hours: Temp:  [98.1 F (36.7 C)-100.1 F (37.8 C)] 99.5 F (37.5 C) (06/25 1155) Pulse Rate:  [37-113] 67 (06/25 1200) Resp:  [16-39] 18 (06/25 1200) BP: (91-128)/(40-80) 128/62 mmHg (06/25 1200) SpO2:  [97 %-100 %] 98 % (06/25 1200) FiO2 (%):  [30 %-45 %] 30 % (06/25 1142) Weight:  [60.3 kg (132 lb 15 oz)] 60.3 kg (132 lb 15 oz) (06/25 0500)  Intake/Output from previous day: 06/24 0701 - 06/25 0700 In: 1879.7 [I.V.:1859.7; NG/GT:20] Out: 365 [Urine:215; Emesis/NG output:150] Intake/Output this shift: Total I/O In: 626.2 [I.V.:162.8; NG/GT:413.3; IV Piggyback:50] Out: 310 [Urine:60; Emesis/NG output:250]   Recent Labs  11/19/2014 1903 12/04/14 0812  HGB 9.6* 9.8*    Recent Labs  11/15/2014 1903 12/04/14 0812  WBC 7.3 7.9  RBC 3.53* 3.57*  HCT 32.7* 33.2*  PLT 168 153    Recent Labs  11/28/2014 1903 12/04/14 0654  NA 143 142  K 3.9 4.5  CL 96* 98*  CO2 37* 34*  BUN 53* 49*  CREATININE 1.31* 1.61*  GLUCOSE 173* 142*  CALCIUM 8.5* 8.5*    Recent Labs  11/20/2014 1903  INR 1.02    Intact pulses distally LE edema still present  Assessment/Plan: * Surgery Date in Future * Procedure(s) (LRB): INTRAMEDULLARY (IM) NAIL INTERTROCHANTRIC (Right) Bedrest Plan for IM nail R hip fracture tomorrow if cleared by medicine/cards/pulm with Dr. Zenia Resides 12/05/2014, 12:57 PM

## 2014-12-05 NOTE — Progress Notes (Signed)
Orthopedic Tech Progress Note Patient Details:  Monica Rhodes May 28, 1928 [id number]  Patient ID: Monica Rhodes, female   DOB: Nov 16, 1927, 81 y.o.   MRN: [id number] Pt unable to use trapeze bar patient helper  Hildred Priest 12/05/2014, 7:30 AM

## 2014-12-05 NOTE — Consult Note (Signed)
Name: Monica Rhodes MRN: [id number] DOB: May 18, 1928    ADMISSION DATE:  11/18/2014 CONSULTATION DATE:  12/05/2014  REFERRING MD :  Humphrey Rolls  CHIEF COMPLAINT:  Hypercarbic respiratory failure  BRIEF PATIENT DESCRIPTION: 81 y.o. F brought to [facility] ED 6/23 with R hip pain after mechanical fall.  Found to have displaced intertrochanteric fx.  Transferred to [facility] at request of ortho MD.  Developed hypoxia and tachypnea so placed on BiPAP.  ABG revealed hypercarbic and hypoxic resp failure; after settings adjusted, hypercarbia not resolved so PCCM called for further recs.  SIGNIFICANT EVENTS  6/23 - admitted with right intertrochanteric fx. 6/24 - PCCM consulted for acute on chronic hypercapnic resp failure, ET Tplaced   STUDIES:  CXR 6/23 >>> b/l opacities, pna vs edema.  Stable cardiomegaly and b/l pleural effusions. R Knee 6/23 >>> no acute findings. R hip 6/23 >>> acute right intertrochanteric fx. CT head / cspine 6/23 >>> no acute process.  SUBJECTIVE: failed wean this am horribly  VITAL SIGNS: Temp:  [98.1 F (36.7 C)-100.1 F (37.8 C)] 99.6 F (37.6 C) (06/25 0750) Pulse Rate:  [37-112] 95 (06/25 0730) Resp:  [18-39] 39 (06/25 0730) BP: (91-146)/(40-82) 109/48 mmHg (06/25 0730) SpO2:  [97 %-100 %] 100 % (06/25 0730) FiO2 (%):  [30 %-50 %] 30 % (06/25 0730) Weight:  [60.3 kg (132 lb 15 oz)] 60.3 kg (132 lb 15 oz) (06/25 0500)  PHYSICAL EXAMINATION: General: Elderly female, resting in bed, rass -2 Neuro: rass -2, does become awake agitated HEENT: jvd present Cardiovascular: IRIR, no M/R/G.  NOT tachy Lungs: coarse Abdomen: BS x 4, soft, NT/ND.  Musculoskeletal: No gross deformities, 2+ pitting edema peripherally. Skin: Intact, warm, no rashes.   Recent Labs Lab 11/17/2014 1903 12/04/14 0654  NA 143 142  K 3.9 4.5  CL 96* 98*  CO2 37* 34*  BUN 53* 49*  CREATININE 1.31* 1.61*  GLUCOSE 173* 142*    Recent Labs Lab 12/05/2014 1903 12/04/14 0812  HGB 9.6* 9.8*  HCT 32.7*  33.2*  WBC 7.3 7.9  PLT 168 153   Dg Chest 1 View  11/24/2014   CLINICAL DATA:  Shortness of breath.  EXAM: CHEST  1 VIEW  COMPARISON:  August 10, 2014.  FINDINGS: Stable cardiomegaly. No pneumothorax is noted. Increased interstitial and airspace opacities are noted in both lungs concerning for pneumonia or edema. Stable bilateral pleural effusions are noted. Bony thorax is intact.  IMPRESSION: Increased bilateral lung opacities are noted concerning for pneumonia or edema. Stable cardiomegaly and bilateral pleural effusions are noted.   Electronically Signed   By: Marijo Conception, M.D.   On: 11/22/2014 20:38   Ct Head Wo Contrast  11/14/2014   CLINICAL DATA:  Status post fall today.  EXAM: CT HEAD WITHOUT CONTRAST  CT CERVICAL SPINE WITHOUT CONTRAST  TECHNIQUE: Multidetector CT imaging of the head and cervical spine was performed following the standard protocol without intravenous contrast. Multiplanar CT image reconstructions of the cervical spine were also generated.  COMPARISON:  Head CT scan 11/30/2005.  FINDINGS: CT HEAD FINDINGS  Chronic microvascular ischemic change is identified. There is no evidence of acute intracranial abnormality including hemorrhage, infarct, mass lesion, mass effect, midline shift or abnormal extra-axial fluid collection. The calvarium is intact. Imaged paranasal sinuses and mastoid air cells are clear.  CT CERVICAL SPINE FINDINGS  No cervical spine fracture is seen. 0.3 cm anterolisthesis C3 on C4 is due to facet arthropathy. There is marked loss of disc space height  and endplate spurring at I4-3 and C6-7. Lung apices demonstrate partial visualization of layering bilateral pleural effusions.  IMPRESSION: No acute abnormality head or cervical spine.  Layering bilateral pleural effusions.  Atrophy and chronic microvascular ischemic change.  Cervical spondylosis.   Electronically Signed   By: Inge Rise M.D.   On: 12/02/2014 19:40   Ct Cervical Spine Wo  Contrast  11/20/2014   CLINICAL DATA:  Status post fall today.  EXAM: CT HEAD WITHOUT CONTRAST  CT CERVICAL SPINE WITHOUT CONTRAST  TECHNIQUE: Multidetector CT imaging of the head and cervical spine was performed following the standard protocol without intravenous contrast. Multiplanar CT image reconstructions of the cervical spine were also generated.  COMPARISON:  Head CT scan 11/30/2005.  FINDINGS: CT HEAD FINDINGS  Chronic microvascular ischemic change is identified. There is no evidence of acute intracranial abnormality including hemorrhage, infarct, mass lesion, mass effect, midline shift or abnormal extra-axial fluid collection. The calvarium is intact. Imaged paranasal sinuses and mastoid air cells are clear.  CT CERVICAL SPINE FINDINGS  No cervical spine fracture is seen. 0.3 cm anterolisthesis C3 on C4 is due to facet arthropathy. There is marked loss of disc space height and endplate spurring at P2-9 and C6-7. Lung apices demonstrate partial visualization of layering bilateral pleural effusions.  IMPRESSION: No acute abnormality head or cervical spine.  Layering bilateral pleural effusions.  Atrophy and chronic microvascular ischemic change.  Cervical spondylosis.   Electronically Signed   By: Inge Rise M.D.   On: 12/08/2014 19:40   Portable Chest Xray  12/04/2014   CLINICAL DATA:  Endotracheal tube placement.  Initial encounter.  EXAM: PORTABLE CHEST - 1 VIEW  COMPARISON:  Chest radiograph performed 11/15/2014  FINDINGS: The patient's endotracheal tube is seen ending 3 cm above the carina.  Small bilateral pleural effusions are noted, with bibasilar airspace opacification, compatible with pulmonary edema. No pneumothorax is seen.  The cardiomediastinal silhouette is enlarged. No acute osseous abnormalities are identified.  IMPRESSION: 1. Endotracheal tube seen ending 3 cm above the carina. 2. Small bilateral pleural effusions, with bibasilar airspace opacification, compatible with pulmonary  edema. Underlying cardiomegaly noted.   Electronically Signed   By: Garald Balding M.D.   On: 12/04/2014 06:34   Dg Knee Complete 4 Views Right  12/09/2014   CLINICAL DATA:  Fall at home.  Right knee pain.  Right hip fracture  EXAM: RIGHT KNEE - COMPLETE 4+ VIEW  COMPARISON:  Pelvic radiograph same day  FINDINGS: There is narrowing of the medial compartment and lateral compartment. No acute fracture. No joint effusion. Atherosclerotic calcification  IMPRESSION: No acute findings of the right knee.   Electronically Signed   By: Suzy Bouchard M.D.   On: 11/27/2014 20:38   Dg Abd Portable 1v  12/04/2014   CLINICAL DATA:  Check gastric catheter placement  EXAM: PORTABLE ABDOMEN - 1 VIEW  COMPARISON:  04/17/2012  FINDINGS: Scattered large and small bowel gas is noted. A nasogastric catheter is noted within the stomach. Postsurgical changes are seen. Bibasilar infiltrates are noted  IMPRESSION: Nasogastric catheter within the stomach.   Electronically Signed   By: Inez Catalina M.D.   On: 12/04/2014 09:51   Dg Hip Unilat  With Pelvis 2-3 Views Right  12/05/2014   CLINICAL DATA:  Status post fall today. Right hip pain. Initial encounter.  EXAM: RIGHT HIP (WITH PELVIS) 2-3 VIEWS  COMPARISON:  None.  FINDINGS: The patient has an acute right intertrochanteric fracture. The lesser trochanter is a  separate fragment. No other acute bony or joint abnormality is identified.  IMPRESSION: Acute right intertrochanteric fracture.   Electronically Signed   By: Inge Rise M.D.   On: 11/19/2014 18:52    ASSESSMENT / PLAN:  PULMONARY OETT 6/24 >>>  A: Acute on chronic hypercapnic and hypoxemic respiratory failure. Pulmonary edema - s/p 120mg  lasix. Bilateral pleural effusions. R/o Asp PNA COPD by report - no PFT's on file. P:   ABG last reviewed, consider further increase MV, ensure we are at 8 cc/kg, then likely rate increase  Albuterol PRN. Noted wean attempt, rapid failure noted CXR in AM, some  clearing noted LLL Neg balance goals remain  CARDIOVASCULAR A:  A.fib with RVR. Acute on chronic systolic heart failure - echo from March 2016 with EF 35 - 40%, mod MR, mod to large pericardial effusion (unchanged). Hx HLD, CAD, PAD, MR, known pericardial effusion. P:  Metop Per crads, ratre is controlled wil add dig if needed tele Echo p Reluctant anticoagulation with hip  RENAL A:   CKD III. P:   NS @ 75. BMP pending this am   GASTROINTESTINAL A:   GI prophylaxis. Nutrition. P:  SUP: Pantoprazole. Tube feeds to goal  HEMATOLOGIC A:   VTE Prophylaxis. P:  SCD's. CBC in AM. Repeat coags in am for OR  INFECTIOUS A:   R/o PNA, asp?, hip fx P:   Change ancef to ceftriaxone (will also cover hip) Ensure sputum sent pcxr in am   ENDOCRINE A:   DM.   P:   SSI. TSH. wnl  NEUROLOGIC A:   Acute encephalopathy. P:   Sedation:  Fentanyl gtt. RASS goal: 0 to -1. Daily WUA. precedex  MUSCULOSKELETAL A: Right intertrochanteric hip fx after mechanical fall. P: Ortho following, planning to take to OR in am . ABX changed , will cover  Family updated: Son at bedside by me 6/24  Interdisciplinary Family Meeting v Palliative Care Meeting:  Due by: 6/30.  Ccm time 30 min   Lavon Paganini. Titus Mould, MD, [place] Pgr: [facility]

## 2014-12-05 NOTE — Progress Notes (Signed)
Sputum sample obtained and sent down to main lab without any complications.  Will continue to monitor.

## 2014-12-05 NOTE — Progress Notes (Signed)
Pharmacist Heart Failure Core Measure Documentation  Assessment: Monica Rhodes has an EF documented as 20-25% on 12/04/2014 by ECHO.  Rationale: Heart failure patients with left ventricular systolic dysfunction (LVSD) and an EF < 40% should be prescribed an angiotensin converting enzyme inhibitor (ACEI) or angiotensin receptor blocker (ARB) at discharge unless a contraindication is documented in the medical record.  This patient is not currently on an ACEI or ARB for HF.  This note is being placed in the record in order to provide documentation that a contraindication to the use of these agents is present for this encounter.  ACE Inhibitor or Angiotensin Receptor Blocker is contraindicated (specify all that apply)  []   ACEI allergy AND ARB allergy []   Angioedema []   Moderate or severe aortic stenosis []   Hyperkalemia [x]   Hypotension []   Renal artery stenosis [x]   Worsening renal function, preexisting renal disease or dysfunction   Deboraha Sprang 12/05/2014 10:51 AM

## 2014-12-06 ENCOUNTER — Inpatient Hospital Stay (HOSPITAL_COMMUNITY): Payer: Medicare Other

## 2014-12-06 ENCOUNTER — Encounter (HOSPITAL_COMMUNITY): Admission: EM | Disposition: E | Payer: Self-pay | Source: Home / Self Care | Attending: Internal Medicine

## 2014-12-06 ENCOUNTER — Inpatient Hospital Stay (HOSPITAL_COMMUNITY): Payer: Medicare Other | Admitting: Anesthesiology

## 2014-12-06 DIAGNOSIS — S72141A Displaced intertrochanteric fracture of right femur, initial encounter for closed fracture: Principal | ICD-10-CM

## 2014-12-06 HISTORY — PX: INTRAMEDULLARY (IM) NAIL INTERTROCHANTERIC: SHX5875

## 2014-12-06 LAB — COMPREHENSIVE METABOLIC PANEL
ALBUMIN: 2.1 g/dL — AB (ref 3.5–5.0)
ALT: 9 U/L — ABNORMAL LOW (ref 14–54)
AST: 29 U/L (ref 15–41)
Alkaline Phosphatase: 40 U/L (ref 38–126)
Anion gap: 8 (ref 5–15)
BUN: 75 mg/dL — ABNORMAL HIGH (ref 6–20)
CALCIUM: 7.9 mg/dL — AB (ref 8.9–10.3)
CHLORIDE: 103 mmol/L (ref 101–111)
CO2: 33 mmol/L — AB (ref 22–32)
CREATININE: 1.67 mg/dL — AB (ref 0.44–1.00)
GFR calc Af Amer: 31 mL/min — ABNORMAL LOW (ref >60)
GFR calc non Af Amer: 27 mL/min — ABNORMAL LOW (ref >60)
Glucose, Bld: 117 mg/dL — ABNORMAL HIGH (ref 65–99)
Potassium: 3.1 mmol/L — ABNORMAL LOW (ref 3.5–5.1)
SODIUM: 144 mmol/L (ref 135–145)
TOTAL PROTEIN: 4.9 g/dL — AB (ref 6.5–8.1)
Total Bilirubin: 0.5 mg/dL (ref 0.3–1.2)

## 2014-12-06 LAB — POCT I-STAT 3, ART BLOOD GAS (G3+)
Acid-Base Excess: 8 mmol/L — ABNORMAL HIGH (ref 0.0–2.0)
Bicarbonate: 32.3 mEq/L — ABNORMAL HIGH (ref 20.0–24.0)
O2 Saturation: 97 %
Patient temperature: 98.6
TCO2: 34 mmol/L (ref 0–100)
pCO2 arterial: 45.5 mmHg — ABNORMAL HIGH (ref 35.0–45.0)
pH, Arterial: 7.459 — ABNORMAL HIGH (ref 7.350–7.450)
pO2, Arterial: 91 mmHg (ref 80.0–100.0)

## 2014-12-06 LAB — GLUCOSE, CAPILLARY
GLUCOSE-CAPILLARY: 160 mg/dL — AB (ref 65–99)
Glucose-Capillary: 137 mg/dL — ABNORMAL HIGH (ref 65–99)
Glucose-Capillary: 104 mg/dL — ABNORMAL HIGH (ref 65–99)
Glucose-Capillary: 112 mg/dL — ABNORMAL HIGH (ref 65–99)
Glucose-Capillary: 222 mg/dL — ABNORMAL HIGH (ref 65–99)
Glucose-Capillary: 207 mg/dL — ABNORMAL HIGH (ref 65–99)

## 2014-12-06 LAB — DIFFERENTIAL
BASOS ABS: 0 10*3/uL (ref 0.0–0.1)
Basophils Relative: 0 % (ref 0–1)
Eosinophils Absolute: 0 10*3/uL (ref 0.0–0.7)
Eosinophils Relative: 0 % (ref 0–5)
Lymphocytes Relative: 12 % (ref 12–46)
Lymphs Abs: 1.1 10*3/uL (ref 0.7–4.0)
MONOS PCT: 11 % (ref 3–12)
Monocytes Absolute: 1 10*3/uL (ref 0.1–1.0)
Neutro Abs: 7 10*3/uL (ref 1.7–7.7)
Neutrophils Relative %: 77 % (ref 43–77)

## 2014-12-06 LAB — HEPARIN LEVEL (UNFRACTIONATED)

## 2014-12-06 LAB — PREPARE RBC (CROSSMATCH)

## 2014-12-06 LAB — CBC
HEMATOCRIT: 30.9 % — AB (ref 36.0–46.0)
Hemoglobin: 9.7 g/dL — ABNORMAL LOW (ref 12.0–15.0)
MCH: 28.3 pg (ref 26.0–34.0)
MCHC: 31.4 g/dL (ref 30.0–36.0)
MCV: 90.1 fL (ref 78.0–100.0)
Platelets: 121 10*3/uL — ABNORMAL LOW (ref 150–400)
RBC: 3.43 MIL/uL — ABNORMAL LOW (ref 3.87–5.11)
RDW: 14.3 % (ref 11.5–15.5)
WBC: 9.1 10*3/uL (ref 4.0–10.5)

## 2014-12-06 LAB — PHOSPHORUS
PHOSPHORUS: 5.2 mg/dL — AB (ref 2.5–4.6)
PHOSPHORUS: 6 mg/dL — AB (ref 2.5–4.6)

## 2014-12-06 LAB — PROTIME-INR
INR: 1.25 (ref 0.00–1.49)
PROTHROMBIN TIME: 15.8 s — AB (ref 11.6–15.2)

## 2014-12-06 LAB — APTT: aPTT: 32 seconds (ref 24–37)

## 2014-12-06 LAB — MAGNESIUM
Magnesium: 1.9 mg/dL (ref 1.7–2.4)
Magnesium: 1.9 mg/dL (ref 1.7–2.4)

## 2014-12-06 SURGERY — FIXATION, FRACTURE, INTERTROCHANTERIC, WITH INTRAMEDULLARY ROD
Anesthesia: General | Laterality: Right

## 2014-12-06 SURGERY — FIXATION, FRACTURE, INTERTROCHANTERIC, WITH INTRAMEDULLARY ROD
Anesthesia: General | Site: Hip | Laterality: Right

## 2014-12-06 MED ORDER — ACETAMINOPHEN 325 MG PO TABS
650.0000 mg | ORAL_TABLET | Freq: Four times a day (QID) | ORAL | Status: DC | PRN
  Administered 2014-12-07 – 2014-12-08 (×2): 650 mg via ORAL
  Filled 2014-12-06 (×2): qty 2

## 2014-12-06 MED ORDER — ALBUMIN HUMAN 5 % IV SOLN
INTRAVENOUS | Status: AC
  Filled 2014-12-06: qty 250

## 2014-12-06 MED ORDER — TRAMADOL HCL 50 MG PO TABS
50.0000 mg | ORAL_TABLET | ORAL | Status: DC | PRN
  Administered 2014-12-06 – 2014-12-08 (×3): 50 mg via ORAL
  Filled 2014-12-06 (×3): qty 1

## 2014-12-06 MED ORDER — FENTANYL CITRATE (PF) 250 MCG/5ML IJ SOLN
INTRAMUSCULAR | Status: AC
  Filled 2014-12-06: qty 5

## 2014-12-06 MED ORDER — ONDANSETRON HCL 4 MG/2ML IJ SOLN: 4.0000 mg | Freq: Four times a day (QID) | INTRAMUSCULAR | Status: DC | PRN

## 2014-12-06 MED ORDER — 0.9 % SODIUM CHLORIDE (POUR BTL) OPTIME
TOPICAL | Status: DC | PRN
  Administered 2014-12-06: 1000 mL

## 2014-12-06 MED ORDER — ALBUMIN HUMAN 5 % IV SOLN
INTRAVENOUS | Status: DC | PRN
  Administered 2014-12-06: 10:00:00 via INTRAVENOUS

## 2014-12-06 MED ORDER — METOCLOPRAMIDE HCL 10 MG PO TABS
5.0000 mg | ORAL_TABLET | Freq: Three times a day (TID) | ORAL | Status: DC | PRN
  Filled 2014-12-06 (×2): qty 1

## 2014-12-06 MED ORDER — HEPARIN (PORCINE) IN NACL 100-0.45 UNIT/ML-% IJ SOLN
1150.0000 [IU]/h | INTRAMUSCULAR | Status: DC
  Administered 2014-12-06: 850 [IU]/h via INTRAVENOUS
  Filled 2014-12-06 (×2): qty 250

## 2014-12-06 MED ORDER — MIDAZOLAM HCL 2 MG/2ML IJ SOLN
2.0000 mg | INTRAMUSCULAR | Status: DC | PRN
  Administered 2014-12-07: 2 mg via INTRAVENOUS
  Filled 2014-12-06: qty 2

## 2014-12-06 MED ORDER — TRAMADOL HCL 50 MG PO TABS: 50.0000 mg | ORAL_TABLET | ORAL | Status: DC

## 2014-12-06 MED ORDER — ACETAMINOPHEN 650 MG RE SUPP
650.0000 mg | Freq: Four times a day (QID) | RECTAL | Status: DC | PRN
  Filled 2014-12-06: qty 1

## 2014-12-06 MED ORDER — METOCLOPRAMIDE HCL 5 MG/ML IJ SOLN
5.0000 mg | Freq: Three times a day (TID) | INTRAMUSCULAR | Status: DC | PRN
  Administered 2014-12-08: 5 mg via INTRAVENOUS
  Filled 2014-12-06 (×2): qty 2

## 2014-12-06 MED ORDER — VECURONIUM BROMIDE 10 MG IV SOLR
INTRAVENOUS | Status: DC | PRN
  Administered 2014-12-06: 5 mg via INTRAVENOUS

## 2014-12-06 MED ORDER — VECURONIUM BROMIDE 10 MG IV SOLR
INTRAVENOUS | Status: AC
  Filled 2014-12-06: qty 10

## 2014-12-06 MED ORDER — CEFAZOLIN SODIUM-DEXTROSE 2-3 GM-% IV SOLR
INTRAVENOUS | Status: AC
  Filled 2014-12-06: qty 50

## 2014-12-06 MED ORDER — FENTANYL CITRATE (PF) 100 MCG/2ML IJ SOLN: 25.0000 ug | INTRAMUSCULAR | Status: DC | PRN

## 2014-12-06 MED ORDER — POTASSIUM CHLORIDE 10 MEQ/100ML IV SOLN
10.0000 meq | INTRAVENOUS | Status: AC
  Administered 2014-12-06 (×3): 10 meq via INTRAVENOUS
  Filled 2014-12-06 (×3): qty 100

## 2014-12-06 MED ORDER — FENTANYL CITRATE (PF) 250 MCG/5ML IJ SOLN
INTRAMUSCULAR | Status: DC | PRN
  Administered 2014-12-06: 125 ug via INTRAVENOUS
  Administered 2014-12-06: 100 ug via INTRAVENOUS
  Administered 2014-12-06: 125 ug via INTRAVENOUS
  Administered 2014-12-06: 150 ug via INTRAVENOUS

## 2014-12-06 MED ORDER — PHENYLEPHRINE HCL 10 MG/ML IJ SOLN
10.0000 mg | INTRAVENOUS | Status: DC | PRN
  Administered 2014-12-06: 80 ug/min via INTRAVENOUS

## 2014-12-06 MED ORDER — ONDANSETRON HCL 4 MG PO TABS
4.0000 mg | ORAL_TABLET | Freq: Four times a day (QID) | ORAL | Status: DC | PRN
  Administered 2014-12-08: 4 mg via ORAL
  Filled 2014-12-06 (×2): qty 1

## 2014-12-06 MED ORDER — CEFAZOLIN SODIUM-DEXTROSE 2-3 GM-% IV SOLR
INTRAVENOUS | Status: DC | PRN
Start: 2014-12-06 — End: 2014-12-06
  Administered 2014-12-06: 2 g via INTRAVENOUS

## 2014-12-06 MED ORDER — SODIUM CHLORIDE 0.9 % IJ SOLN
INTRAMUSCULAR | Status: AC
  Filled 2014-12-06: qty 10

## 2014-12-06 MED ORDER — PANTOPRAZOLE SODIUM 40 MG PO PACK
40.0000 mg | PACK | Freq: Every day | ORAL | Status: DC
  Administered 2014-12-06 – 2014-12-08 (×3): 40 mg
  Filled 2014-12-06 (×4): qty 20

## 2014-12-06 MED ORDER — PHENOL 1.4 % MT LIQD
1.0000 | OROMUCOSAL | Status: DC | PRN
  Filled 2014-12-06: qty 177

## 2014-12-06 MED ORDER — CEFAZOLIN SODIUM-DEXTROSE 2-3 GM-% IV SOLR
2.0000 g | Freq: Four times a day (QID) | INTRAVENOUS | Status: AC
  Administered 2014-12-06 (×2): 2 g via INTRAVENOUS
  Filled 2014-12-06 (×3): qty 50

## 2014-12-06 MED ORDER — MENTHOL 3 MG MT LOZG
1.0000 | LOZENGE | OROMUCOSAL | Status: DC | PRN
  Filled 2014-12-06: qty 9

## 2014-12-06 SURGICAL SUPPLY — 43 items
BIT DRILL AO GAMMA 4.2X130 (BIT) ×3 IMPLANT
BIT DRILL AO GAMMA 4.2X180 (BIT) ×3 IMPLANT
BNDG COHESIVE 4X5 TAN STRL (GAUZE/BANDAGES/DRESSINGS) ×3 IMPLANT
BNDG GAUZE ELAST 4 BULKY (GAUZE/BANDAGES/DRESSINGS) ×3 IMPLANT
CLOSURE STERI-STRIP 1/2X4 (GAUZE/BANDAGES/DRESSINGS) ×1
CLSR STERI-STRIP ANTIMIC 1/2X4 (GAUZE/BANDAGES/DRESSINGS) ×2 IMPLANT
COVER PERINEAL POST (MISCELLANEOUS) ×3 IMPLANT
COVER SURGICAL LIGHT HANDLE (MISCELLANEOUS) ×3 IMPLANT
DRAPE STERI IOBAN 125X83 (DRAPES) ×3 IMPLANT
DRSG MEPILEX BORDER 4X4 (GAUZE/BANDAGES/DRESSINGS) ×3 IMPLANT
DURAPREP 26ML APPLICATOR (WOUND CARE) ×3 IMPLANT
ELECT REM PT RETURN 9FT ADLT (ELECTROSURGICAL) ×3
ELECTRODE REM PT RTRN 9FT ADLT (ELECTROSURGICAL) ×1 IMPLANT
GLOVE BIO SURGEON STRL SZ7 (GLOVE) ×6 IMPLANT
GLOVE BIO SURGEON STRL SZ7.5 (GLOVE) ×3 IMPLANT
GLOVE BIOGEL PI IND STRL 7.0 (GLOVE) ×2 IMPLANT
GLOVE BIOGEL PI IND STRL 8 (GLOVE) ×1 IMPLANT
GLOVE BIOGEL PI INDICATOR 7.0 (GLOVE) ×4
GLOVE BIOGEL PI INDICATOR 8 (GLOVE) ×2
GOWN STRL REUS W/ TWL LRG LVL3 (GOWN DISPOSABLE) ×2 IMPLANT
GOWN STRL REUS W/TWL LRG LVL3 (GOWN DISPOSABLE) ×6
GUIDEROD T2 3X1000 (ROD) ×3 IMPLANT
K-WIRE  3.2X450M STR (WIRE) ×4
K-WIRE 3.2X450M STR (WIRE) ×2
KIT ROOM TURNOVER OR (KITS) ×3 IMPLANT
KWIRE 3.2X450M STR (WIRE) ×2 IMPLANT
MANIFOLD NEPTUNE II (INSTRUMENTS) ×3 IMPLANT
NAIL GAMMA LG R 5TI 10X360X125 (Nail) ×3 IMPLANT
NS IRRIG 1000ML POUR BTL (IV SOLUTION) ×3 IMPLANT
PACK GENERAL/GYN (CUSTOM PROCEDURE TRAY) ×3 IMPLANT
PAD ARMBOARD 7.5X6 YLW CONV (MISCELLANEOUS) ×3 IMPLANT
PAD CAST 4YDX4 CTTN HI CHSV (CAST SUPPLIES) ×1 IMPLANT
PADDING CAST COTTON 4X4 STRL (CAST SUPPLIES) ×3
SCREW LAG GAMMA 3 TI 10.5X90MM (Screw) ×3 IMPLANT
SCREW LOCKING T2 F/T  5MMX35MM (Screw) ×2 IMPLANT
SCREW LOCKING T2 F/T 5MMX35MM (Screw) ×1 IMPLANT
STAPLER VISISTAT 35W (STAPLE) ×3 IMPLANT
SUT MNCRL AB 4-0 PS2 18 (SUTURE) IMPLANT
SUT MON AB 2-0 CT1 27 (SUTURE) ×3 IMPLANT
SUT VIC AB 0 CT1 27 (SUTURE) ×3
SUT VIC AB 0 CT1 27XBRD ANBCTR (SUTURE) ×1 IMPLANT
TOWEL OR 17X24 6PK STRL BLUE (TOWEL DISPOSABLE) ×3 IMPLANT
TOWEL OR 17X26 10 PK STRL BLUE (TOWEL DISPOSABLE) ×3 IMPLANT

## 2014-12-06 NOTE — Progress Notes (Signed)
Transported pt from 2M03 to OR, while manually ventilating pt. Pt stable throughout with no complications. Pt handed off to CRNA at [place]. RT will continue to monitor.

## 2014-12-06 NOTE — Interval H&P Note (Signed)
History and Physical Interval Note:  12/07/2014 7:36 AM  Monica Rhodes  has presented today for surgery, with the diagnosis of RIGHT HIP FRACTURE  The various methods of treatment have been discussed with the patient and family. After consideration of risks, benefits and other options for treatment, the patient has consented to  Procedure(s): INTRAMEDULLARY (IM) NAIL INTERTROCHANTRIC (Right) as a surgical intervention .  The patient's history has been reviewed, patient examined, no change in status, stable for surgery.  I have reviewed the patient's chart and labs.  Questions were answered to the patient's satisfaction.     Modene Andy D

## 2014-12-06 NOTE — Consult Note (Signed)
ANTICOAGULATION CONSULT NOTE - Initial Consult  Pharmacy Consult for Heparin Indication: atrial fibrillation  Allergies  Allergen Reactions  . Morphine And Related Other (See Comments)    Patient stated that she can't take morphine  . Promethazine Other (See Comments)    Unknown     Patient Measurements: Height: 5\' 1"  (154.9 cm) Weight: 132 lb 4.4 oz (60 kg) IBW/kg (Calculated) : 47.8   Vital Signs: Temp: 97.5 F (36.4 C) (06/26 1128) Temp Source: Axillary (06/26 1128) BP: 105/48 mmHg (06/26 1300) Pulse Rate: 74 (06/26 1300)  Labs:  Recent Labs  11/12/2014 1903 12/04/14 0654 12/04/14 0812 11/14/2014 0215  HGB 9.6*  --  9.8* 9.7*  HCT 32.7*  --  33.2* 30.9*  PLT 168  --  153 121*  APTT  --   --   --  32  LABPROT 13.6  --   --  15.8*  INR 1.02  --   --  1.25  CREATININE 1.31* 1.61*  --  1.67*    Estimated Creatinine Clearance: 20.5 mL/min (by C-G formula based on Cr of 1.67).   Medical History: Past Medical History  Diagnosis Date  . Hypertension   . UTI (urinary tract infection)   . SBO (small bowel obstruction)     Recurrent, prolonged hospitalization November, 2012  . Protein calorie malnutrition   . Hyperlipidemia   . DM (diabetes mellitus)   . COPD (chronic obstructive pulmonary disease)   . PVD (peripheral vascular disease)   . Colon cancer   . Bradycardia   . Tobacco abuse   . CAD (coronary artery disease)     Non-STEMI November, 2012,, Cardiac catheterization was attempted, despite significant efforts catheters could not be progressed to the coronary arteries ., Therefore medical therapy  . Carotid artery disease     Doppler  April 16, 2011,, total occlusion LICA, 54-56% R. ICA  . PAD (peripheral artery disease)     Suggestions significant stenosis distal aorta by CT angiogram  November, 2012  . Ejection fraction     EF 60%, echo, November, 2012,  . Mitral regurgitation     Moderate, echo, November, 2012  . Right ventricular dysfunction    Moderate, echo, November, 2012  . Edema     December, 2012  . Rash     Rash  probably related to hydralazine  December, 2012  . Hx of echocardiogram     Echo 3/16:  EF 35-40%, ant-lat HK, mod MR, mod LAE, mod to large pericardial effusion with no evidence of tamponade (unchanged from prior echo)  . PONV (postoperative nausea and vomiting)     post op confusion, combative  . CHF (congestive heart failure)    Assessment: 87yof with afib, previously deemed not a candidate for anticoagulation (due to pericardial effusion), now to begin IV heparin. CHADSVASC = 6. She is s/p IM nail to her right hip today so will avoid boluses.   Goal of Therapy:  Heparin level 0.3-0.7 units/ml Monitor platelets by anticoagulation protocol: Yes   Plan:  1) Begin heparin at 850 units/hr with no bolus 2) Check 8 hour heparin level 3) Daily heparin level and CBC  Deboraha Sprang 11/17/2014,2:06 PM

## 2014-12-06 NOTE — Transfer of Care (Signed)
Immediate Anesthesia Transfer of Care Note  Patient: Monica Rhodes  Procedure(s) Performed: Procedure(s): INTRAMEDULLARY (IM) NAIL INTERTROCHANTRIC (Right)  Patient Location: PACU  Anesthesia Type:General  Level of Consciousness: sedated, unresponsive and Patient remains intubated per anesthesia plan  Airway & Oxygen Therapy: Patient remains intubated per anesthesia plan and Patient placed on Ventilator (see vital sign flow sheet for setting)  Post-op Assessment: Report given to RN and Post -op Vital signs reviewed and stable  Post vital signs: Reviewed and stable  Last Vitals:  Filed Vitals:   11/25/2014 0732  BP: 129/59  Pulse: 100  Temp:   Resp: 29    Complications: No apparent anesthesia complications

## 2014-12-06 NOTE — Progress Notes (Signed)
Received patient back to 42m03 from OR. Patient reconnected; stats stable. No apparent complications. RT will continue to monitor.

## 2014-12-06 NOTE — Anesthesia Postprocedure Evaluation (Signed)
  Anesthesia Post-op Note  Patient: JEMMIE LEDGERWOOD  Procedure(s) Performed: Procedure(s): INTRAMEDULLARY (IM) NAIL INTERTROCHANTRIC (Right)  Patient Location: ICU  Anesthesia Type:General  Level of Consciousness: sedated, unresponsive and Patient remains intubated per anesthesia plan  Airway and Oxygen Therapy: Patient remains intubated per anesthesia plan  Post-op Pain: sedated, not responsive. not able to evaluate  Post-op Assessment: Post-op Vital signs reviewed, Patient's Cardiovascular Status Stable and Respiratory Function Stable LLE Motor Response: Purposeful movement   RLE Motor Response: Purposeful movement        Post-op Vital Signs: Reviewed and stable  Last Vitals:  Filed Vitals:   11/26/2014 0732  BP: 129/59  Pulse: 100  Temp:   Resp: 29    Complications: No apparent anesthesia complications

## 2014-12-06 NOTE — H&P (View-Only) (Signed)
Subjective: * Surgery Date in Future * Procedure(s) (LRB): INTRAMEDULLARY (IM) NAIL INTERTROCHANTRIC (Right)  Patient resting comfortably, son Fritz Pickerel at bedside.  Objective: Vital signs in last 24 hours: Temp:  [98.1 F (36.7 C)-100.1 F (37.8 C)] 99.5 F (37.5 C) (06/25 1155) Pulse Rate:  [37-113] 67 (06/25 1200) Resp:  [16-39] 18 (06/25 1200) BP: (91-128)/(40-80) 128/62 mmHg (06/25 1200) SpO2:  [97 %-100 %] 98 % (06/25 1200) FiO2 (%):  [30 %-45 %] 30 % (06/25 1142) Weight:  [60.3 kg (132 lb 15 oz)] 60.3 kg (132 lb 15 oz) (06/25 0500)  Intake/Output from previous day: 06/24 0701 - 06/25 0700 In: 1879.7 [I.V.:1859.7; NG/GT:20] Out: 365 [Urine:215; Emesis/NG output:150] Intake/Output this shift: Total I/O In: 626.2 [I.V.:162.8; NG/GT:413.3; IV Piggyback:50] Out: 310 [Urine:60; Emesis/NG output:250]   Recent Labs  11/19/2014 1903 12/04/14 0812  HGB 9.6* 9.8*    Recent Labs  11/12/2014 1903 12/04/14 0812  WBC 7.3 7.9  RBC 3.53* 3.57*  HCT 32.7* 33.2*  PLT 168 153    Recent Labs  11/13/2014 1903 12/04/14 0654  NA 143 142  K 3.9 4.5  CL 96* 98*  CO2 37* 34*  BUN 53* 49*  CREATININE 1.31* 1.61*  GLUCOSE 173* 142*  CALCIUM 8.5* 8.5*    Recent Labs  11/29/2014 1903  INR 1.02    Intact pulses distally LE edema still present  Assessment/Plan: * Surgery Date in Future * Procedure(s) (LRB): INTRAMEDULLARY (IM) NAIL INTERTROCHANTRIC (Right) Bedrest Plan for IM nail R hip fracture tomorrow if cleared by medicine/cards/pulm with Dr. Zenia Resides 12/05/2014, 12:57 PM

## 2014-12-06 NOTE — Progress Notes (Signed)
Tube feeds were paused because upon initial assessment, OG tube looked like it was too far out.  Tube was advanced and placement was ascultated.  KUB ordered.  Tube feeding still on hold.

## 2014-12-06 NOTE — Progress Notes (Deleted)
Patient bagged from OR and placed back on ventilator on full support without any complications. Will continue to monitor.

## 2014-12-06 NOTE — Progress Notes (Signed)
Spoke with e-link, MD Tamala Julian about patient's agitation (precedex gtt 1.1, tramadol 50mg , and versed 1mg ).  Not comfortable giving patient another 1mg  of versed due to recent drop in BP (99/ 35, MAP 52).  Patient is still trying to remove safety mitts, pulling at IV lines as well as ETT.  Verbal orders received for a sitter.

## 2014-12-06 NOTE — Progress Notes (Signed)
ANTICOAGULATION CONSULT NOTE - Follow Up Consult  Pharmacy Consult for Heparin  Indication: atrial fibrillation  Allergies  Allergen Reactions  . Morphine And Related Other (See Comments)    Patient stated that she can't take morphine  . Promethazine Other (See Comments)    Unknown    Patient Measurements: Height: 5\' 1"  (154.9 cm) Weight: 132 lb 4.4 oz (60 kg) IBW/kg (Calculated) : 47.8  Vital Signs: Temp: 99 F (37.2 C) (06/26 2007) Temp Source: Oral (06/26 2007) BP: 99/35 mmHg (06/26 2200) Pulse Rate: 104 (06/26 2200)  Labs:  Recent Labs  12/04/14 0654 12/04/14 0812 11/15/2014 0215 12/05/2014 2231  HGB  --  9.8* 9.7*  --   HCT  --  33.2* 30.9*  --   PLT  --  153 121*  --   APTT  --   --  32  --   LABPROT  --   --  15.8*  --   INR  --   --  1.25  --   HEPARINUNFRC  --   --   --  <0.10*  CREATININE 1.61*  --  1.67*  --     Estimated Creatinine Clearance: 20.5 mL/min (by C-G formula based on Cr of 1.67).  Assessment: 81 y/o F on heparin for afib, high risk for bleed (no bolus), first HL is undetectable, other lab as above, no issues per RN.   Goal of Therapy:  Heparin level 0.3-0.7 units/ml Monitor platelets by anticoagulation protocol: Yes   Plan:  -Increase heparin to 1000 units/hr -0800 HL -Daily CBC/HL -Monitor for bleeding  Narda Bonds 11/18/2014,11:43 PM

## 2014-12-06 NOTE — Anesthesia Preprocedure Evaluation (Addendum)
Anesthesia Evaluation  Patient identified by MRN, date of birth, ID band Patient awake    Reviewed: Allergy & Precautions, H&P , NPO status , Patient's Chart, lab work & pertinent test results, reviewed documented beta blocker date and time   History of Anesthesia Complications (+) PONV  Airway Mallampati: Intubated       Dental no notable dental hx. (+) Teeth Intact   Pulmonary COPDformer smoker,  On ventilator breath sounds clear to auscultation  Pulmonary exam normal       Cardiovascular hypertension, Pt. on medications and Pt. on home beta blockers + CAD, + Peripheral Vascular Disease and +CHF + dysrhythmias Atrial Fibrillation Rhythm:Irregular Rate:Normal     Neuro/Psych negative neurological ROS  negative psych ROS   GI/Hepatic negative GI ROS, Neg liver ROS,   Endo/Other  diabetes  Renal/GU Renal InsufficiencyRenal disease  negative genitourinary   Musculoskeletal   Abdominal   Peds  Hematology negative hematology ROS (+)   Anesthesia Other Findings   Reproductive/Obstetrics negative OB ROS                           Anesthesia Physical Anesthesia Plan  ASA: IV  Anesthesia Plan: General   Post-op Pain Management:    Induction: Intravenous  Airway Management Planned: Oral ETT  Additional Equipment: Arterial line  Intra-op Plan:   Post-operative Plan: Post-operative intubation/ventilation  Informed Consent: I have reviewed the patients History and Physical, chart, labs and discussed the procedure including the risks, benefits and alternatives for the proposed anesthesia with the patient or authorized representative who has indicated his/her understanding and acceptance.   Dental advisory given  Plan Discussed with: CRNA  Anesthesia Plan Comments:        Anesthesia Quick Evaluation

## 2014-12-06 NOTE — Progress Notes (Signed)
Pt family said that reaction to morphine is psychotic in nature and refused any fentanyl pain medication. MD notified. Order obtained for tramadol and cleared with pharmacist Mickel Baas via telephone that this drug is appropriate despite allergy.

## 2014-12-06 NOTE — Progress Notes (Signed)
Name: Monica Rhodes MRN: [id number] DOB: [birth year]    ADMISSION DATE:  11/26/2014 CONSULTATION DATE:  11/15/2014  REFERRING MD :  Humphrey Rolls  CHIEF COMPLAINT:  Hypercarbic respiratory failure  BRIEF PATIENT DESCRIPTION: 81 y.o. F brought to [facility] ED 6/23 with R hip pain after mechanical fall.  Found to have displaced intertrochanteric fx.  Transferred to [facility] at request of ortho MD.  Developed hypoxia and tachypnea so placed on BiPAP.  ABG revealed hypercarbic and hypoxic resp failure; after settings adjusted, hypercarbia not resolved so PCCM called for further recs.  SIGNIFICANT EVENTS  6/23 - admitted with right intertrochanteric fx. 6/24 - PCCM consulted for acute on chronic hypercapnic resp failure, ET Tplaced  6/25- improved pcxr, improved abg, neg 143 cc  STUDIES:  CXR 6/23 >>> b/l opacities, pna vs edema.  Stable cardiomegaly and b/l pleural effusions. R Knee 6/23 >>> no acute findings. R hip 6/23 >>> acute right intertrochanteric fx. CT head / cspine 6/23 >>> no acute process.  SUBJECTIVE: failed wean, improved pcxr, improved abg  VITAL SIGNS: Temp:  [98.6 F (37 C)-100.8 F (38.2 C)] 98.6 F (37 C) (06/26 0020) Pulse Rate:  [42-165] 165 (06/26 0600) Resp:  [13-39] 38 (06/26 0600) BP: (99-134)/(36-80) 124/64 mmHg (06/26 0600) SpO2:  [97 %-100 %] 98 % (06/26 0600) FiO2 (%):  [30 %] 30 % (06/26 0400) Weight:  [60 kg (132 lb 4.4 oz)] 60 kg (132 lb 4.4 oz) (06/26 0442)  PHYSICAL EXAMINATION: General: Elderly female, resting in bed, rass -1 Neuro: rass -1, does become awake agitated but improved this am  HEENT: jvd about the same Cardiovascular: IRIR, no M/R/G.  NOT tachy Lungs: coarse resolving, mild improved apical Abdomen: BS wnl, soft, NT/ND.  Musculoskeletal: No gross deformities, 1+ pitting edema peripherally. Skin: Intact, warm, no rashes.   Recent Labs Lab 11/28/2014 1903 12/04/14 0654 11/22/2014 0215  NA 143 142 144  K 3.9 4.5 3.1*  CL 96* 98* 103  CO2 37* 34*  33*  BUN 53* 49* 75*  CREATININE 1.31* 1.61* 1.67*  GLUCOSE 173* 142* 117*    Recent Labs Lab 11/21/2014 1903 12/04/14 0812 11/14/2014 0215  HGB 9.6* 9.8* 9.7*  HCT 32.7* 33.2* 30.9*  WBC 7.3 7.9 9.1  PLT 168 153 121*   Portable Chest Xray  12/05/2014   CLINICAL DATA:  Acute respiratory failure.  EXAM: PORTABLE CHEST - 1 VIEW  COMPARISON:  12/04/2014, 11/15/2014 and 08/04/2014  FINDINGS: Pulmonary vascular congestion has resolved and the bilateral pleural effusions have diminished. Endotracheal tube in good position. NG tube tip below the diaphragm.  IMPRESSION: Improved pleural effusions and resolution of pulmonary vascular congestion.   Electronically Signed   By: Lorriane Shire M.D.   On: 12/05/2014 08:45   Dg Abd Portable 1v  12/04/2014   CLINICAL DATA:  Check gastric catheter placement  EXAM: PORTABLE ABDOMEN - 1 VIEW  COMPARISON:  04/17/2012  FINDINGS: Scattered large and small bowel gas is noted. A nasogastric catheter is noted within the stomach. Postsurgical changes are seen. Bibasilar infiltrates are noted  IMPRESSION: Nasogastric catheter within the stomach.   Electronically Signed   By: Inez Catalina M.D.   On: 12/04/2014 09:51    ASSESSMENT / PLAN:  PULMONARY OETT 6/24 >>>  A: Acute on chronic hypercapnic and hypoxemic respiratory failure. Pulmonary edema - s/p 120mg  lasix. Bilateral pleural effusions. R/o Asp PNA COPD by report - no PFT's on file. P:   pcxr is improved Adjust ET Tout 2 cm (done  pre op) ABG reviewed, adjust TV down to exactly 8 cc/kg I called Anesthesia and asked them to keep her intubated, given acute resp failure and failed weans, we will recover her and wean her in icu today pcxr better after lasix, assess post op for me pulm risk explained to family be me and accepted  CARDIOVASCULAR A:  A.fib with RVR. Acute on chronic systolic heart failure - echo from March 2016 with EF 35 - 40%, mod MR, mod to large pericardial effusion (unchanged). Hx  HLD, CAD, PAD, MR, known pericardial effusion. P:  Metop eval HR response post surgery wil add dig if needed tele Echo pending  RENAL A:   CKD III. hypoK P:   NS @ 75, consider kvo k supp now ordered pre op BMP pending this am   GASTROINTESTINAL A:   GI prophylaxis. Nutrition. P:  SUP: Pantoprazole. Tube feeds held, OR  HEMATOLOGIC A:   VTE Prophylaxis. P:  SCD's. CBC in AM post op Repeat coags in am for OR - wnl  INFECTIOUS A:   R/o PNA, asp?, hip fx P:   6/24 ceftriaxone (will also cover hip)>>> Follow sputum  ENDOCRINE A:   DM.   P:   SSI. TSH. wnl  NEUROLOGIC A:   Acute encephalopathy. P:   Sedation:  Fentanyl gtt. RASS goal: 0 to -1. Daily WUA. precedex on going Avoid benzo  MUSCULOSKELETAL A: Right intertrochanteric hip fx after mechanical fall. P: Ortho following, planning to take to OR NOW  Family updated: Son at bedside by me 6/24  Interdisciplinary Family Meeting v Palliative Care Meeting:  Due by: 6/30.  Explained high risk of surgery, accepted by family for benefit of ambulation in future Ccm time 30 min   Lavon Paganini. Titus Mould, MD, Ute Pgr: [facility]

## 2014-12-06 NOTE — Progress Notes (Signed)
Patient ID: Monica Rhodes, female   DOB: 10-Aug-1927, 81 y.o.   MRN: [id number]      Subjective:    No events overnight. Patient back from OR this AM  Objective:   Temp:  [97.5 F (36.4 C)-100.8 F (38.2 C)] 97.5 F (36.4 C) (06/26 1128) Pulse Rate:  [43-165] 89 (06/26 1142) Resp:  [12-38] 13 (06/26 1115) BP: (92-134)/(36-80) 101/39 mmHg (06/26 1142) SpO2:  [97 %-100 %] 98 % (06/26 1115) FiO2 (%):  [30 %] 30 % (06/26 1142) Weight:  [132 lb 4.4 oz (60 kg)] 132 lb 4.4 oz (60 kg) (06/26 0442) Last BM Date: 12/04/14  Filed Weights   12/04/14 0500 12/05/14 0500 11/23/2014 0442  Weight: 126 lb 1.7 oz (57.2 kg) 132 lb 15 oz (60.3 kg) 132 lb 4.4 oz (60 kg)    Intake/Output Summary (Last 24 hours) at 11/23/2014 1255 Last data filed at 11/11/2014 0859  Gross per 24 hour  Intake 1951.82 ml  Output   2905 ml  Net -953.18 ml    Exam:  General: NAD  Resp: clear anteriorally  Cardiac:irreg, no m/r/g, no JVD  GI: abdomen soft, NT, ND  MSK: 1-2+ bilateral LE edema  Neuro: no focal deficits    Lab Results:  Basic Metabolic Panel:  Recent Labs Lab 12/07/2014 1903 12/04/14 0654  12/05/14 0220 12/05/14 1355 11/26/2014 0215  NA 143 142  --   --   --  144  K 3.9 4.5  --   --   --  3.1*  CL 96* 98*  --   --   --  103  CO2 37* 34*  --   --   --  33*  GLUCOSE 173* 142*  --   --   --  117*  BUN 53* 49*  --   --   --  75*  CREATININE 1.31* 1.61*  --   --   --  1.67*  CALCIUM 8.5* 8.5*  --   --   --  7.9*  MG  --   --   < > 1.9 1.9 1.9  < > = values in this interval not displayed.  Liver Function Tests:  Recent Labs Lab 12/04/14 0654 12/05/2014 0215  AST 33 29  ALT 12* 9*  ALKPHOS 61 40  BILITOT 0.5 0.5  PROT 6.5 4.9*  ALBUMIN 3.1* 2.1*    CBC:  Recent Labs Lab 11/16/2014 1903 12/04/14 0812 11/13/2014 0215  WBC 7.3 7.9 9.1  HGB 9.6* 9.8* 9.7*  HCT 32.7* 33.2* 30.9*  MCV 92.6 93.0 90.1  PLT 168 153 121*    Cardiac Enzymes: No results for input(s): CKTOTAL,  CKMB, CKMBINDEX, TROPONINI in the last 168 hours.  BNP: No results for input(s): PROBNP in the last 8760 hours.  Coagulation:  Recent Labs Lab 11/18/2014 1903 12/05/2014 0215  INR 1.02 1.25    ECG:   Medications:   Scheduled Medications: . antiseptic oral rinse  7 mL Mouth Rinse QID  . atorvastatin  20 mg Per Tube q1800  .  ceFAZolin (ANCEF) IV  2 g Intravenous Q6H  . cefTRIAXone (ROCEPHIN)  IV  1 g Intravenous Q24H  . chlorhexidine  15 mL Mouth Rinse BID  . feeding supplement (ENSURE ENLIVE)  237 mL Oral BID BM  . ferrous sulfate  325 mg Oral TID PC  . insulin aspart  0-15 Units Subcutaneous 6 times per day  . pantoprazole sodium  40 mg Per Tube Daily  Infusions: . sodium chloride 75 mL/hr at 11/21/2014 0757  . dexmedetomidine 0.7 mcg/kg/hr (11/25/2014 1013)  . feeding supplement (VITAL AF 1.2 CAL) Stopped (12/01/2014 0000)  . fentaNYL infusion INTRAVENOUS Stopped (12/04/14 1245)     PRN Medications:  acetaminophen **OR** acetaminophen, albuterol, fentaNYL, fentaNYL (SUBLIMAZE) injection, fentaNYL (SUBLIMAZE) injection, fentaNYL (SUBLIMAZE) injection, hydrALAZINE, Linaclotide, menthol-cetylpyridinium **OR** phenol, methocarbamol **OR** methocarbamol (ROBAXIN)  IV, metoCLOPramide **OR** metoCLOPramide (REGLAN) injection, metoprolol, midazolam, midazolam, nitroGLYCERIN, ondansetron **OR** ondansetron (ZOFRAN) IV, polyethylene glycol     Assessment/Plan    1. Acute systolic/diastolic HF - 9/62/22 echo LVEF 20-25%, diffuse hypokinesis, moderate MR - LVEF decreased from 08/2014 echo (LVEF was 35-40% at that time) - CXR improved pleural effusions and pulm edema - neg 131 mL yesterday, + 1.3 liters since admission. Uptrend in renal function. She received metolazone 5mg  and lasix 80mg  IV once yesterday without much diuresis  - despite her LE edema, she is oxygenating fairly well on minimal vent settings. With uptrend in Cr and anticipated fluids shifts with recent surgery will  hold on diuretics today.   2. Pericardial effusion - echo shows moderate to large effusion without criteria for tamponade by echo, stable from prior study. 3 months ago.   3. Afib - from primary cardiologists note has not been a candidate for anticoagulation - rates currently 80-90s, currently on prn IV metoprolol  4. Hip fracture - patient s/p surgery today.       Carlyle Dolly, M.D.

## 2014-12-06 NOTE — Op Note (Signed)
DATE OF SURGERY:  11/11/2014  TIME: 9:09 AM  PATIENT NAME:  Monica Rhodes  AGE: 81 y.o.  PRE-OPERATIVE DIAGNOSIS:  RIGHT HIP FRACTURE  POST-OPERATIVE DIAGNOSIS:  SAME  PROCEDURE:  INTRAMEDULLARY (IM) NAIL INTERTROCHANTRIC  SURGEON:  Kierstin January D  ASSISTANT:  Lovett Calender, PA-C, She was present and scrubbed throughout the case, critical for completion in a timely fashion, and for retraction, instrumentation, and closure.  OPERATIVE IMPLANTS: Stryker Gamma Nail with distal interlock screw  PREOPERATIVE INDICATIONS:  [name] is a 81 y.o. year old who fell and suffered a hip fracture. She was brought into the ER and then admitted and optimized and then elected for surgical intervention.    The risks benefits and alternatives were discussed with the patient including but not limited to the risks of nonoperative treatment, versus surgical intervention including infection, bleeding, nerve injury, malunion, nonunion, hardware prominence, hardware failure, need for hardware removal, blood clots, cardiopulmonary complications, morbidity, mortality, among others, and they were willing to proceed.    OPERATIVE PROCEDURE:  The patient was brought to the operating room and placed in the supine position. General anesthesia was administered, with a foley. She was placed on the fracture table.  Closed reduction was performed under C-arm guidance. The length of the femur was also measured using fluoroscopy. Time out was then performed after sterile prep and drape. She received preoperative antibiotics.  Incision was made proximal to the greater trochanter. A guidewire was placed in the appropriate position. Confirmation was made on AP and lateral views. The above-named nail was opened. I opened the proximal femur with a reamer. I then placed the nail by hand easily down. I did not need to ream the femur.  Once the nail was completely seated, I placed a guidepin into the femoral head  into the center center position. I measured the length, and then reamed the lateral cortex and up into the head. I then placed the lag screw. Slight compression was applied. Anatomic fixation achieved. Bone quality was mediocre.  I then secured the proximal interlocking bolt, and took off a half a turn, and then removed the instruments, and took final C-arm pictures AP and lateral the entire length of the leg.  I then used perfect circles technique to place a distal interlock screw.   Anatomic reconstruction was achieved, and the wounds were irrigated copiously and closed with Vicryl followed by staples and sterile gauze for the skin. The patient was awakened and returned to PACU in stable and satisfactory condition. There no complications and the patient tolerated the procedure well.  She will be weightbearing as tolerated, and will be on chemical px  for a period of four weeks after discharge.   Edmonia Lynch, M.D.    This note was generated using a template and dragon dictation system. In light of that, I have reviewed the note and all aspects of it are applicable to this case. Any dictation errors are due to the computerized dictation system.

## 2014-12-06 NOTE — Progress Notes (Signed)
I long was discussion with Monica Rhodes's 2 sons this morning. They voiced understanding that she is at high risk for surgery. Per both her son's reports she was in an independent ambulator with a walker or cane prior to her fall on Thursday. Despite her tenuous respiratory status she has been relatively stable. I discussed nonoperative treatment with her son's as an option. In light of all of this her son's would like to go forward with operative fixation of her hip.    Dawn Kiper D

## 2014-12-07 ENCOUNTER — Encounter (HOSPITAL_COMMUNITY): Payer: Self-pay | Admitting: Orthopedic Surgery

## 2014-12-07 ENCOUNTER — Inpatient Hospital Stay (HOSPITAL_COMMUNITY): Payer: Medicare Other

## 2014-12-07 DIAGNOSIS — N183 Chronic kidney disease, stage 3 (moderate): Secondary | ICD-10-CM

## 2014-12-07 DIAGNOSIS — I5023 Acute on chronic systolic (congestive) heart failure: Secondary | ICD-10-CM | POA: Diagnosis present

## 2014-12-07 DIAGNOSIS — J9601 Acute respiratory failure with hypoxia: Secondary | ICD-10-CM

## 2014-12-07 LAB — CULTURE, RESPIRATORY W GRAM STAIN

## 2014-12-07 LAB — MAGNESIUM
MAGNESIUM: 1.8 mg/dL (ref 1.7–2.4)
Magnesium: 1.9 mg/dL (ref 1.7–2.4)

## 2014-12-07 LAB — BASIC METABOLIC PANEL
Anion gap: 9 (ref 5–15)
BUN: 74 mg/dL — ABNORMAL HIGH (ref 6–20)
CALCIUM: 7.6 mg/dL — AB (ref 8.9–10.3)
CO2: 31 mmol/L (ref 22–32)
Chloride: 105 mmol/L (ref 101–111)
Creatinine, Ser: 1.6 mg/dL — ABNORMAL HIGH (ref 0.44–1.00)
GFR, EST AFRICAN AMERICAN: 33 mL/min — AB (ref >60)
GFR, EST NON AFRICAN AMERICAN: 28 mL/min — AB (ref >60)
GLUCOSE: 126 mg/dL — AB (ref 65–99)
POTASSIUM: 2.9 mmol/L — AB (ref 3.5–5.1)
SODIUM: 145 mmol/L (ref 135–145)

## 2014-12-07 LAB — PHOSPHORUS
Phosphorus: 5.4 mg/dL — ABNORMAL HIGH (ref 2.5–4.6)
Phosphorus: 5.5 mg/dL — ABNORMAL HIGH (ref 2.5–4.6)

## 2014-12-07 LAB — CBC WITH DIFFERENTIAL/PLATELET
Basophils Absolute: 0 K/uL (ref 0.0–0.1)
Basophils Relative: 0 % (ref 0–1)
Eosinophils Absolute: 0 K/uL (ref 0.0–0.7)
Eosinophils Relative: 0 % (ref 0–5)
HCT: 26.7 % — ABNORMAL LOW (ref 36.0–46.0)
Hemoglobin: 8.3 g/dL — ABNORMAL LOW (ref 12.0–15.0)
Lymphocytes Relative: 14 % (ref 12–46)
Lymphs Abs: 1 K/uL (ref 0.7–4.0)
MCH: 28.1 pg (ref 26.0–34.0)
MCHC: 31.1 g/dL (ref 30.0–36.0)
MCV: 90.5 fL (ref 78.0–100.0)
Monocytes Absolute: 0.7 K/uL (ref 0.1–1.0)
Monocytes Relative: 10 % (ref 3–12)
Neutro Abs: 5.4 K/uL (ref 1.7–7.7)
Neutrophils Relative %: 76 % (ref 43–77)
Platelets: 107 K/uL — ABNORMAL LOW (ref 150–400)
RBC: 2.95 MIL/uL — ABNORMAL LOW (ref 3.87–5.11)
RDW: 14.2 % (ref 11.5–15.5)
WBC: 7.2 K/uL (ref 4.0–10.5)

## 2014-12-07 LAB — HEPARIN LEVEL (UNFRACTIONATED): Heparin Unfractionated: 0.2 IU/mL — ABNORMAL LOW (ref 0.30–0.70)

## 2014-12-07 LAB — GLUCOSE, CAPILLARY
GLUCOSE-CAPILLARY: 122 mg/dL — AB (ref 65–99)
GLUCOSE-CAPILLARY: 214 mg/dL — AB (ref 65–99)
Glucose-Capillary: 138 mg/dL — ABNORMAL HIGH (ref 65–99)
Glucose-Capillary: 145 mg/dL — ABNORMAL HIGH (ref 65–99)
Glucose-Capillary: 187 mg/dL — ABNORMAL HIGH (ref 65–99)
Glucose-Capillary: 74 mg/dL (ref 65–99)

## 2014-12-07 LAB — CULTURE, RESPIRATORY

## 2014-12-07 LAB — CALCIUM, IONIZED: Calcium, Ionized, Serum: 4.5 mg/dL (ref 4.5–5.6)

## 2014-12-07 MED ORDER — HEPARIN SODIUM (PORCINE) 5000 UNIT/ML IJ SOLN
5000.0000 [IU] | Freq: Three times a day (TID) | INTRAMUSCULAR | Status: DC
  Administered 2014-12-07 – 2014-12-09 (×8): 5000 [IU] via SUBCUTANEOUS
  Filled 2014-12-07 (×13): qty 1

## 2014-12-07 MED ORDER — POTASSIUM CHLORIDE 10 MEQ/100ML IV SOLN
10.0000 meq | INTRAVENOUS | Status: AC
  Administered 2014-12-07 (×2): 10 meq via INTRAVENOUS
  Filled 2014-12-07: qty 100

## 2014-12-07 MED ORDER — POTASSIUM CHLORIDE 20 MEQ/15ML (10%) PO SOLN
40.0000 meq | Freq: Once | ORAL | Status: AC
  Administered 2014-12-07: 40 meq
  Filled 2014-12-07: qty 30

## 2014-12-07 NOTE — Progress Notes (Signed)
eLink Physician-Brief Progress Note Patient Name: Monica Rhodes DOB: 06-19-1927 MRN: [id number]   Date of Service  12/07/2014  HPI/Events of Note    eICU Interventions  Hypokalemia -repleted      Intervention Category Intermediate Interventions: Electrolyte abnormality - evaluation and management  Waldo Damian V. 12/07/2014, 3:41 AM

## 2014-12-07 NOTE — Progress Notes (Signed)
Pt alert and oriented, stable on documented settings. BiPAP not placed at this time. Plan to monitor closely.

## 2014-12-07 NOTE — Progress Notes (Signed)
     Subjective:  POD#1 R IM nail of hip. Nursing reports agitation overnight.  Sitter ordered to help keep patient from removing lines.  Versed not given due to hypotension overnight. Extubated today.  Following commands.   Objective:   VITALS:   Filed Vitals:   12/07/14 0500 12/07/14 0600 12/07/14 0700 12/07/14 0745  BP: 95/49 99/49 110/43   Pulse: 90 75 71   Temp:    98.7 F (37.1 C)  TempSrc:    Oral  Resp: 18 19 20    Height:      Weight: 63.7 kg (140 lb 6.9 oz)     SpO2: 99% 99% 99%    Extubated and following commands ABD soft Neurovascular intact Sensation intact distally Intact pulses distally Dorsiflexion/Plantar flexion intact Incision: dressing C/D/I   Lab Results  Component Value Date   WBC 7.2 12/07/2014   HGB 8.3* 12/07/2014   HCT 26.7* 12/07/2014   MCV 90.5 12/07/2014   PLT 107* 12/07/2014   BMET    Component Value Date/Time   NA 145 12/07/2014 0209   K 2.9* 12/07/2014 0209   CL 105 12/07/2014 0209   CO2 31 12/07/2014 0209   GLUCOSE 126* 12/07/2014 0209   BUN 74* 12/07/2014 0209   CREATININE 1.60* 12/07/2014 0209   CALCIUM 7.6* 12/07/2014 0209   GFRNONAA 28* 12/07/2014 0209   GFRAA 33* 12/07/2014 0209     Assessment/Plan: 1 Day Post-Op   Principal Problem:   Fracture, intertrochanteric, right femur Active Problems:   Hypertension   Hyperlipidemia   DM (diabetes mellitus)   COPD (chronic obstructive pulmonary disease)   CKD (chronic kidney disease) stage 3, GFR 30-59 ml/min   Chronic systolic CHF (congestive heart failure)   Atrial fibrillation and flutter   Pressure ulcer   Protein-calorie malnutrition, severe   Acute respiratory failure with hypoxia and hypercarbia   COLD (chronic obstructive lung disease)   Up with therapy WBAT in the RLE Heparin for DVT prophylaxis per medicine/critical care   Shaterra Sanzone Marie 12/07/2014, 7:49 AM Cell [phone]

## 2014-12-07 NOTE — Care Management (Signed)
Important Message  Patient Details  Name: Monica Rhodes MRN: [id number] Date of Birth: 1927-09-28   Medicare Important Message Given:  Yes-second notification given    Nathen May 12/07/2014, 4:02 PM

## 2014-12-07 NOTE — Progress Notes (Signed)
Repeated bedside nurse swallow eval. Pt tolerated sips of water. Will consider advancing diet tomorrow morning with MD.

## 2014-12-07 NOTE — Progress Notes (Signed)
Patient ID: Monica Rhodes, female   DOB: 1927/12/17, 81 y.o.   MRN: [id number]      Subjective:    No events overnight. Still intubated from surgery. Sedated but awake.   Objective:   Temp:  [97.5 F (36.4 C)-99.1 F (37.3 C)] 98.7 F (37.1 C) (06/27 0745) Pulse Rate:  [43-104] 89 (06/27 0800) Resp:  [12-27] 27 (06/27 0800) BP: (80-119)/(33-87) 94/57 mmHg (06/27 0800) SpO2:  [95 %-100 %] 100 % (06/27 0800) FiO2 (%):  [30 %] 30 % (06/27 0740) Weight:  [63.7 kg (140 lb 6.9 oz)] 63.7 kg (140 lb 6.9 oz) (06/27 0500) Last BM Date: 12/04/14  Filed Weights   12/05/14 0500 11/21/2014 0442 12/07/14 0500  Weight: 60.3 kg (132 lb 15 oz) 60 kg (132 lb 4.4 oz) 63.7 kg (140 lb 6.9 oz)    Intake/Output Summary (Last 24 hours) at 12/07/14 0910 Last data filed at 12/07/14 0900  Gross per 24 hour  Intake 2922.47 ml  Output   2025 ml  Net 897.47 ml    Exam:  General: NAD, intubated  Resp: clear anteriorally  Cardiac:irreg, no m/r/g, no JVD  GI: abdomen soft, NT, ND  MSK: 1+ bilateral LE edema, protective mitts in place with restraints.   Neuro: no focal deficits    Lab Results:  Basic Metabolic Panel:  Recent Labs Lab 12/04/14 0654  11/15/2014 0215 11/11/2014 1439 12/07/14 0209  NA 142  --  144  --  145  K 4.5  --  3.1*  --  2.9*  CL 98*  --  103  --  105  CO2 34*  --  33*  --  31  GLUCOSE 142*  --  117*  --  126*  BUN 49*  --  75*  --  74*  CREATININE 1.61*  --  1.67*  --  1.60*  CALCIUM 8.5*  --  7.9*  --  7.6*  MG  --   < > 1.9 1.9 1.8  1.9  < > = values in this interval not displayed.  Liver Function Tests:  Recent Labs Lab 12/04/14 0654 12/09/2014 0215  AST 33 29  ALT 12* 9*  ALKPHOS 61 40  BILITOT 0.5 0.5  PROT 6.5 4.9*  ALBUMIN 3.1* 2.1*    CBC:  Recent Labs Lab 12/04/14 0812 11/27/2014 0215 12/07/14 0209  WBC 7.9 9.1 7.2  HGB 9.8* 9.7* 8.3*  HCT 33.2* 30.9* 26.7*  MCV 93.0 90.1 90.5  PLT 153 121* 107*    Cardiac Enzymes: No results  for input(s): CKTOTAL, CKMB, CKMBINDEX, TROPONINI in the last 168 hours.  BNP: No results for input(s): PROBNP in the last 8760 hours.  Coagulation:  Recent Labs Lab 11/29/2014 1903 11/12/2014 0215  INR 1.02 1.25    Telemetry: Atrial fibrillation with controlled rate. Frequent PVCs.    Medications:   Scheduled Medications: . antiseptic oral rinse  7 mL Mouth Rinse QID  . atorvastatin  20 mg Per Tube q1800  . cefTRIAXone (ROCEPHIN)  IV  1 g Intravenous Q24H  . chlorhexidine  15 mL Mouth Rinse BID  . feeding supplement (ENSURE ENLIVE)  237 mL Oral BID BM  . ferrous sulfate  325 mg Oral TID PC  . insulin aspart  0-15 Units Subcutaneous 6 times per day  . pantoprazole sodium  40 mg Per Tube Daily    Infusions: . sodium chloride 75 mL/hr at 11/29/2014 2000  . dexmedetomidine 0.8 mcg/kg/hr (12/07/14 0649)  . feeding supplement (VITAL  AF 1.2 CAL) 1,000 mL (11/24/2014 2352)  . fentaNYL infusion INTRAVENOUS Stopped (12/04/14 1245)  . heparin 1,000 Units/hr (11/15/2014 2350)    PRN Medications: acetaminophen **OR** acetaminophen, albuterol, fentaNYL, fentaNYL (SUBLIMAZE) injection, fentaNYL (SUBLIMAZE) injection, fentaNYL (SUBLIMAZE) injection, hydrALAZINE, Linaclotide, menthol-cetylpyridinium **OR** phenol, methocarbamol **OR** methocarbamol (ROBAXIN)  IV, metoCLOPramide **OR** metoCLOPramide (REGLAN) injection, metoprolol, midazolam, midazolam, nitroGLYCERIN, ondansetron **OR** ondansetron (ZOFRAN) IV, polyethylene glycol, traMADol     Assessment/Plan    1. Acute systolic/diastolic HF - 6/37/85 echo LVEF 20-25%, diffuse hypokinesis, moderate MR - LVEF decreased from 08/2014 echo (LVEF was 35-40% at that time) - CXR improved pleural effusions and pulm edema 12/09/2014. - volume positive 473  mL yesterday, + 2.2 liters since admission. Renal function improved slightly.  - She is oxygenating fairly well on minimal vent settings. Weaning vent per CCM. If she has difficulty weaning she may  require more diuresis. Lasix on hold for now due to soft BP and elevated creatinine.   2. Pericardial effusion - echo shows moderate to large effusion posterolaterally without criteria for tamponade by echo, stable from prior study 3 months ago.   3. Afib - from primary cardiologists note she has not been a candidate for anticoagulation - rates currently 80-90s, currently on prn IV metoprolol - I would consider stopping IV heparin now since she is not a candidate for long term anticoagulation. Increased bleeding risk post op hip surgery. Would use DVT prophylaxis dose only.   4. Hip fracture - patient s/p surgery 11/12/2014.   5. Hypokalemia- replete   6. PVCs  Elina Streng [place] MD, [facility]  12/07/2014 9:18 AM

## 2014-12-07 NOTE — Progress Notes (Signed)
eLink Physician-Brief Progress Note Patient Name: Monica Rhodes DOB: 1928-05-13 MRN: [id number]   Date of Service  12/07/2014  HPI/Events of Note  Patient is able to swallow ice chips and liquids. Nurse requests increase in diet to Diabetic clear liquids.   eICU Interventions  Will advance diet to diabetic clear liquid diet.      Intervention Category Minor Interventions: Routine modifications to care plan (e.g. PRN medications for pain, fever)  Sommer,Steven Eugene 12/07/2014, 8:42 PM

## 2014-12-07 NOTE — Progress Notes (Signed)
Inpatient Diabetes Program Recommendations  AACE/ADA: New Consensus Statement on Inpatient Glycemic Control (2013)  Target Ranges:  Prepandial:   less than 140 mg/dL      Peak postprandial:   less than 180 mg/dL (1-2 hours)      Critically ill patients:  140 - 180 mg/dL   Results for Monica Rhodes, Monica Rhodes (MRN [id number]) as of 12/07/2014 09:54  Ref. Range 11/15/2014 07:27 12/08/2014 11:25 12/04/2014 17:09 11/29/2014 20:06 12/07/2014 00:05 12/07/2014 04:03 12/07/2014 07:40  Glucose-Capillary Latest Ref Range: 65-99 mg/dL 112 (H) 160 (H) 222 (H) 207 (H) 138 (H) 122 (H) 187 (H)    Diabetes history: DM2 Outpatient Diabetes medications: none Current orders for Inpatient glycemic control: Novolog 0-15 units Q4H  Inpatient Diabetes Program Recommendations Insulin - Tube Feeding Coverage: If tube feedings will be continued, please consider ordering Novolog 2 units Q4H for tube feeding coverage (hold if tube feeding is held or discontinued).  Thanks, Barnie Alderman, RN, MSN, CCRN, CDE Diabetes Coordinator Inpatient Diabetes Program [phone] (Team Pager from [place] to [place]) [phone] ([place] office) [phone] [facility] office) [phone] [facility] office)

## 2014-12-07 NOTE — Evaluation (Addendum)
Occupational Therapy Evaluation Patient Details Name: Monica Rhodes MRN: [id number] DOB: 1927/07/07 Today's Date: 12/07/2014    History of Present Illness Pt is an 81 y.o. female with history of CHF CAD COPD DM HTN Atrial Fibrillation. Pt fell at home and sustained a R hip fracture. Pt is now s/p Rt IM nail on 11/24/2014 and is WBAT. ABG revealed hypercarbic and hypoxic resp failure and remains intubated.   Clinical Impression   Pt s/p above. Pt independent with ADLs, PTA. Feel pt will benefit from acute OT to increase independence, strength, and activity tolerance prior to d/c. Recommending SNF for rehab.    Follow Up Recommendations  SNF;Supervision/Assistance - 24 hour    Equipment Recommendations  Other (comment) (defer to next venue)    Recommendations for Other Services       Precautions / Restrictions Precautions Precautions: Fall Restrictions Weight Bearing Restrictions: Yes RLE Weight Bearing: Weight bearing as tolerated      Mobility Bed Mobility Overal bed mobility: Needs Assistance;+2 for physical assistance Bed Mobility: Supine to Sit;Sit to Supine     Supine to sit: Total assist;+2 for physical assistance;HOB elevated Sit to supine: Total assist;+2 for physical assistance   General bed mobility comments: Pt required assist for all aspects of bed mobility. Able to initiate movement with the LLE towards EOB, and was attempting to push her trunk up with BUE's on bed. Assist was provided for RLE and trunk support as pt was transitioned to full sitting position. Bed pad used for completion of scooting activity.   Transfers                 General transfer comment: Pt fatigues quickly with EOB activity and RR to 41. Further mobility not attempted at this time.     Balance Overall balance assessment: Needs assistance Sitting-balance support: Feet supported;Bilateral upper extremity supported Sitting balance-Leahy Scale: Poor Sitting balance - Comments:  Pt was able to maintain static sitting EOB with BUE support for brief periods (~5 seconds at a time), however grossly required assist at the trunk for sitting balance.  Postural control: Left lateral lean;Posterior lean                                  ADL Overall ADL's : Needs assistance/impaired Eating/Feeding: NPO   Grooming: Minimal assistance;Moderate assistance;Bed level; washed face   Upper Body Bathing: Moderate assistance;Bed level           Lower Body Dressing: Total assistance;Bed level   Toilet Transfer:  (not assessed)           Functional mobility during ADLs:  (not assessed) General ADL Comments: Pt able to wash off face some in bed (pt intubated-cues to avoid tube)..      Vision     Perception     Praxis      Pertinent Vitals/Pain Pain Assessment: Faces Faces Pain Scale: Hurts even more Pain Location: right hip Pain Descriptors / Indicators: Guarding;Grimacing Pain Intervention(s): Limited activity within patient's tolerance;Repositioned;Monitored during session  RR up to 41 in session. Cues for deep breathing.     Hand Dominance     Extremity/Trunk Assessment Upper Extremity Assessment Upper Extremity Assessment: Generalized weakness (approximatly 90 degrees AROM shoulder flexion/abduction)   Lower Extremity Assessment Lower Extremity Assessment: Defer to PT evaluation RLE Deficits / Details: Decreased strength and AROM consistent with IM nailing. Acute pain noted with movement.  RLE: Unable to  fully assess due to pain RLE Coordination: decreased gross motor;decreased fine motor   Cervical / Trunk Assessment Cervical / Trunk Assessment: Kyphotic   Communication Communication Communication: Other (comment) (intubated)   Cognition Arousal/Alertness: Awake/alert Behavior During Therapy: Flat affect Overall Cognitive Status: Difficult to assess due to intubation.                      General Comments        Exercises  AAROM bilateral shoulders     Shoulder Instructions      Home Living Family/patient expects to be discharged to:: Skilled nursing facility Living Arrangements: Children                               Additional Comments: Per son, his brother lives with pt and states that someone is with her 24 hours.      Prior Functioning/Environment Level of Independence: Independent with assistive device(s)        Comments: Per son's report, pt was independent with a RW and was participating in her own ADL's.     OT Diagnosis: Generalized weakness;Acute pain   OT Problem List: Decreased strength;Decreased activity tolerance;Impaired balance (sitting and/or standing);Decreased knowledge of use of DME or AE;Decreased knowledge of precautions;Pain;Cardiopulmonary status limiting activity;Decreased range of motion   OT Treatment/Interventions: Self-care/ADL training;Therapeutic exercise;DME and/or AE instruction;Therapeutic activities;Cognitive remediation/compensation;Balance training;Patient/family education; Energy Conservation   OT Goals(Current goals can be found in the care plan section) Acute Rehab OT Goals Patient Stated Goal: unable to state OT Goal Formulation: Patient unable to participate in goal setting Time For Goal Achievement: 12/21/14 Potential to Achieve Goals: Fair ADL Goals Pt Will Perform Grooming: with set-up;with supervision;sitting (sitting EOB) Pt Will Perform Upper Body Dressing: with set-up;sitting;with supervision (sitting EOB) Pt Will Transfer to Toilet: with mod assist;stand pivot transfer;bedside commode Additional ADL Goal #1: Pt will perform HEP at supervision level for bilateral UEs to increase strength.  OT Frequency: Min 2X/week   Barriers to D/C:            Co-evaluation PT/OT/SLP Co-Evaluation/Treatment: Yes Reason for Co-Treatment: For patient/therapist safety   OT goals addressed during session: ADL's and self-care;Other  (comment) (mobility)      End of Session Equipment Utilized During Treatment: Other (comment) (ventilator) Nurse Communication: Mobility status  Activity Tolerance: Patient limited by fatigue Patient left: in bed;with call bell/phone within reach; SCDs reapplied; restraints reapplied (mittens)   Time: 0940-1000 OT Time Calculation (min): 20 min Charges:  OT General Charges $OT Visit: 1 Procedure OT Evaluation $Initial OT Evaluation Tier I: 1 Procedure G-CodesBenito Mccreedy OTR/L [id number] 12/07/2014, 10:57 AM

## 2014-12-07 NOTE — Progress Notes (Signed)
ANTICOAGULATION CONSULT NOTE - Follow Up Consult  Pharmacy Consult for Heparin  Indication: atrial fibrillation  Allergies  Allergen Reactions  . Morphine And Related Other (See Comments)    Patient stated that she can't take morphine  . Promethazine Other (See Comments)    Unknown    Patient Measurements: Height: 5\' 1"  (154.9 cm) Weight: 140 lb 6.9 oz (63.7 kg) IBW/kg (Calculated) : 47.8  Vital Signs: Temp: 98.7 F (37.1 C) (06/27 0745) Temp Source: Oral (06/27 0745) BP: 106/46 mmHg (06/27 0900) Pulse Rate: 96 (06/27 0900)  Labs:  Recent Labs  11/25/2014 0215 12/05/2014 2231 12/07/14 0209 12/07/14 0825  HGB 9.7*  --  8.3*  --   HCT 30.9*  --  26.7*  --   PLT 121*  --  107*  --   APTT 32  --   --   --   LABPROT 15.8*  --   --   --   INR 1.25  --   --   --   HEPARINUNFRC  --  <0.10*  --  0.20*  CREATININE 1.67*  --  1.60*  --     Estimated Creatinine Clearance: 22 mL/min (by C-G formula based on Cr of 1.6).  Assessment: 81 y/o F on heparin for afib, high risk for bleed (no bolus),repeat HL remains sub-therapeutic at 0.2, H/H with slight trend down. Plt down to 107. No issues per RN.   Goal of Therapy:  Heparin level 0.3-0.7 units/ml Monitor platelets by anticoagulation protocol: Yes   Plan:  -Increase heparin to 1150 units/hr -0800 HL -Daily CBC/HL -Monitor for bleeding  Albertina Parr, PharmD., BCPS Clinical Pharmacist Pager [phone]

## 2014-12-07 NOTE — Progress Notes (Signed)
Name: Monica Rhodes MRN: [id number] DOB: [birth year]    ADMISSION DATE:  11/22/2014 CONSULTATION DATE:  12/07/2014  REFERRING MD :  Humphrey Rolls  CHIEF COMPLAINT:  Hypercarbic respiratory failure  BRIEF PATIENT DESCRIPTION: 81 y.o. F brought to [facility] ED 6/23 with R hip pain after mechanical fall.  Found to have displaced intertrochanteric fx.  Transferred to [facility] at request of ortho MD.  Developed hypoxia and tachypnea so placed on BiPAP.  ABG revealed hypercarbic and hypoxic resp failure; after settings adjusted, hypercarbia not resolved so PCCM called for further recs.  SIGNIFICANT EVENTS  6/23 - admitted with right intertrochanteric fx. 6/24 - PCCM consulted for acute on chronic hypercapnic resp failure, ET Tplaced  6/25- improved pcxr, improved abg, neg 143 cc 6/26 - to OR for R hip repair  STUDIES:  CXR 6/23 >>> b/l opacities, pna vs edema.  Stable cardiomegaly and b/l pleural effusions. R Knee 6/23 >>> no acute findings. R hip 6/23 >>> acute right intertrochanteric fx. CT head / cspine 6/23 >>> no acute process.  SUBJECTIVE:  Tolerating PSV this am   VITAL SIGNS: Temp:  [97.5 F (36.4 C)-99.1 F (37.3 C)] 98 F (36.7 C) (06/27 1143) Pulse Rate:  [66-104] 102 (06/27 1100) Resp:  [12-28] 28 (06/27 1100) BP: (80-119)/(33-87) 96/41 mmHg (06/27 1100) SpO2:  [85 %-100 %] 85 % (06/27 1100) FiO2 (%):  [30 %] 30 % (06/27 0740) Weight:  [63.7 kg (140 lb 6.9 oz)] 63.7 kg (140 lb 6.9 oz) (06/27 0500)  PHYSICAL EXAMINATION: General: Elderly female, resting in bed, rass -1 to 0 Neuro: rass -1 to 0, follows commands HEENT: jvd 6cm Cardiovascular: IRIR, no M/R/G. HR 90-110's Lungs: clear B Abdomen: BS wnl, soft, NT/ND.  Musculoskeletal: No gross deformities, 1+ pitting edema peripherally. Skin: Intact, warm, no rashes.   Recent Labs Lab 12/04/14 0654 12/04/2014 0215 12/07/14 0209  NA 142 144 145  K 4.5 3.1* 2.9*  CL 98* 103 105  CO2 34* 33* 31  BUN 49* 75* 74*  CREATININE 1.61*  1.67* 1.60*  GLUCOSE 142* 117* 126*    Recent Labs Lab 12/04/14 0812 11/22/2014 0215 12/07/14 0209  HGB 9.8* 9.7* 8.3*  HCT 33.2* 30.9* 26.7*  WBC 7.9 9.1 7.2  PLT 153 121* 107*   Dg Chest Port 1 View  12/07/2014   CLINICAL DATA:  Pulmonary edema, history hypertension, hyperlipidemia, diabetes mellitus, COPD, coronary artery disease, CHF, former smoker  EXAM: PORTABLE CHEST - 1 VIEW  COMPARISON:  Portable exam 0500 hours compared to 12/03/2014  FINDINGS: Tip of endotracheal tube projects 4.9 cm above carina.  Nasogastric tube coiled in stomach.  Enlargement of cardiac silhouette.  Mediastinal contours and pulmonary vascularity normal.  Atelectasis versus consolidation LEFT lower lobe.  Probable bibasilar effusions.  Underlying emphysematous changes and central peribronchial thickening.  Atherosclerotic calcification aorta.  No pneumothorax.  Bones demineralized.  IMPRESSION: COPD changes with LEFT lower lobe atelectasis versus consolidation and probable bibasilar effusions.  Enlargement of cardiac silhouette.   Electronically Signed   By: Lavonia Dana M.D.   On: 12/07/2014 07:25   Dg Chest Port 1 View  11/15/2014   CLINICAL DATA:  Pneumathemia.  EXAM: PORTABLE CHEST - 1 VIEW  COMPARISON:  12/05/2014  FINDINGS: Patient is rotated to the right. Nasogastric tube courses into the region of the stomach and off the inferior portion of the film as side-port is seen just below the expected region of the gastroesophageal junction. Endotracheal tube has tip 2.1 cm above the carina.  Lungs are adequately inflated with bibasilar opacification likely small effusions with atelectasis with possible mild interval improvement. Mild stable cardiomegaly. Remainder of the exam is unchanged.  IMPRESSION: Mild bibasilar opacification likely small effusions with atelectasis slightly improved. Stable mild cardiomegaly.  Tubes and lines as described.   Electronically Signed   By: Marin Olp M.D.   On: 12/09/2014 08:58    Dg Abd Portable 1v  11/22/2014   CLINICAL DATA:  Orogastric tube placement.  EXAM: PORTABLE ABDOMEN - 1 VIEW  COMPARISON:  Earlier this day at 1053 hr  FINDINGS: The enteric tube has been advanced, tip and side-port now below the diaphragm in the stomach. The tip is directed towards the cardia. There is a normal bowel gas pattern.  IMPRESSION: Tip and side port of the enteric tube below the diaphragm in the stomach.   Electronically Signed   By: Jeb Levering M.D.   On: 11/15/2014 20:25   Dg Abd Portable 1v  12/05/2014   CLINICAL DATA:  OG tube placement  EXAM: PORTABLE ABDOMEN - 1 VIEW  COMPARISON:  12/04/2014 CT abdomen pelvis -04/16/2012  FINDINGS: Enteric tube tip and side port projected the expected location the distal esophagus.  Several surgical clips overlie the left mid hemi abdomen and lower pelvis.  Limited visualization of lower thorax demonstrates mild elevation of left hemidiaphragm with associated left basilar/retrocardiac heterogeneous/consolidative opacities.  No definite acute osseus abnormalities.  IMPRESSION: Interval retraction of enteric tube with tip and side port now projected of the distal esophagus. Advancement at least 20 cm is recommended.   Electronically Signed   By: Sandi Mariscal M.D.   On: 12/02/2014 11:13   Dg Hip Operative Unilat With Pelvis Right  11/19/2014   CLINICAL DATA:  ORIF right hip.  EXAM: OPERATIVE RIGHT HIP (WITH PELVIS IF PERFORMED)  VIEWS  TECHNIQUE: Fluoroscopic spot image(s) were submitted for interpretation post-operatively.  FLUOROSCOPY TIME:  Fluoroscopy Time:  1 minutes 29 seconds  Number of Acquired Images:  5  COMPARISON:  11/23/2014  FINDINGS: The images submitted show ORIF of right hip fracture using intra medullary nail and lag screw. No evidence for dislocation or new fracture.  IMPRESSION: ORIF right femoral neck fracture.   Electronically Signed   By: Nolon Nations M.D.   On: 11/13/2014 09:45    ASSESSMENT / PLAN:  PULMONARY OETT 6/24  >>>  A: Acute on chronic hypercapnic and hypoxemic respiratory failure. Pulmonary edema, improved Bilateral pleural effusions. R/o Asp PNA COPD by report - no PFT's on file. P:   Goal  extubate 6/27 pcxr better with diuresis Will discuss w patient and family whether she would want reintubation should she decline  CARDIOVASCULAR A:  A.fib with RVR. Acute on chronic systolic heart failure - echo from March 2016 with EF 35 - 40%, mod MR, mod to large pericardial effusion (unchanged). Hx HLD, CAD, PAD, MR, known pericardial effusion. P:  Metoprolol Diuresis as she can tolerate tele She was deemed a poor candidate for anti-coag as an outpt, discussed today with Dr Martinique. I will stop heparin gtt, change to DVT prophylaxis.   RENAL A:   CKD III. hypoK P:   KVO IVF with hx LV dysfxn K replacement ordered  Follow BMP  GASTROINTESTINAL A:   GI prophylaxis. Nutrition. P:  SUP: Pantoprazole. Swallow eval to assess readiness for PO  HEMATOLOGIC A:   VTE Prophylaxis. P:  SCD's. Follow CBC  INFECTIOUS resp 6/25 >> normal flora A:   R/o PNA, asp? P:  6/24 ceftriaxone (will also cover hip) >>>  ENDOCRINE A:   DM.   P:   SSI. TSH. wnl  NEUROLOGIC A:   Acute encephalopathy. P:   Sedation:  Fentanyl gtt. RASS goal: 0 to -1. Wean precedex to off when extubated 6/27  MUSCULOSKELETAL A: Right intertrochanteric hip fx after mechanical fall. P:  Family updated: Son at bedside by me 6/27   Interdisciplinary Family Meeting v Palliative Care Meeting:  Due by: 6/30.  Independent CC time 35 minutes  Baltazar Apo, MD, PhD 12/07/2014, 12:14 PM [facility] [phone] or if no answer [phone]

## 2014-12-07 NOTE — Procedures (Signed)
Extubation Procedure Note  Patient Details:   Name: Monica Rhodes DOB: 05-01-1928 MRN: [id number]   Airway Documentation:     Evaluation  O2 sats: stable throughout Complications: No apparent complications Patient did tolerate procedure well. Bilateral Breath Sounds: Clear (Simultaneous filing. User may not have seen previous data.) Suctioning: Oral, Airway Yes  Patient was extubated to a 2 LPM nasal cannula per MD order. Patient was stable throughout. Positive cuff leak noted. No evidence of stridor. Patient attempted to speak post extubation. Sats currently 98% vitals are stable. Unable to perform incentive at this. No apparent complications.  Tiburcio Bash 12/07/2014, 10:53 AM

## 2014-12-07 NOTE — Evaluation (Signed)
Physical Therapy Evaluation Patient Details Name: Monica Rhodes MRN: [id number] DOB: March 02, 1928 Today's Date: 12/07/2014   History of Present Illness  Pt is an 81 y.o. female with history of CHF CAD COPD DM HTN Atrial Fibrillation. Pt fell at home and sustained a R hip fracture. Pt is now s/p Rt IM nail on 12/01/2014 and is WBAT. ABG revealed hypercarbic and hypoxic resp failure and remains intubated.  Clinical Impression  Pt admitted with above diagnosis. Pt currently with functional limitations due to the deficits listed below (see PT Problem List). At the time of PT eval pt was able to perform transfers with +2 assist for LE movement and trunk support. She was able to hold ~5 seconds of static sitting without therapist support at a time. RR increased to 41 breaths/min with sitting and further mobility not attempted at this time. Pt will benefit from skilled PT to increase their independence and safety with mobility to allow discharge to the venue listed below. Son present at beginning of session and is agreeable to follow-up therapy at the SNF level at d/c.     Follow Up Recommendations SNF;Supervision/Assistance - 24 hour    Equipment Recommendations  3in1 (PT);Wheelchair (measurements PT);Wheelchair cushion (measurements PT) (Depending on progress with PT)    Recommendations for Other Services       Precautions / Restrictions Precautions Precautions: Fall Restrictions Weight Bearing Restrictions: Yes RLE Weight Bearing: Weight bearing as tolerated      Mobility  Bed Mobility Overal bed mobility: Needs Assistance;+2 for physical assistance Bed Mobility: Supine to Sit;Sit to Supine     Supine to sit: Total assist;+2 for physical assistance;HOB elevated Sit to supine: Total assist;+2 for physical assistance   General bed mobility comments: Pt required assist for all aspects of bed mobility. Able to initiate movement with the LLE towards EOB, and was attempting to push her trunk  up with BUE's on bed. Assist was provided for RLE and trunk support as pt was transitioned to full sitting position. Bed pad used for completion of scooting activity.   Transfers                 General transfer comment: Pt fatigues quickly with EOB activity and RR to 41. Further mobility not attempted at this time.   Ambulation/Gait                Stairs            Wheelchair Mobility    Modified Rankin (Stroke Patients Only)       Balance Overall balance assessment: Needs assistance Sitting-balance support: Feet supported;Bilateral upper extremity supported Sitting balance-Leahy Scale: Poor Sitting balance - Comments: Pt was able to maintain static sitting EOB with BUE support for brief periods (~5 seconds at a time), however grossly required assist at the trunk for sitting balance.  Postural control: Left lateral lean;Posterior lean                                   Pertinent Vitals/Pain Pain Assessment: Faces Faces Pain Scale: Hurts even more Pain Location: R hip Pain Descriptors / Indicators: Operative site guarding;Grimacing Pain Intervention(s): Limited activity within patient's tolerance;Monitored during session;Repositioned    Home Living Family/patient expects to be discharged to:: Skilled nursing facility Living Arrangements: Children               Additional Comments: Per son, his brother lives with pt  and states that someone is with her 24 hours.    Prior Function Level of Independence: Independent with assistive device(s)         Comments: Per son's report, pt was independent with a RW and was participating in her own ADL's.      Hand Dominance        Extremity/Trunk Assessment   Upper Extremity Assessment: Defer to OT evaluation           Lower Extremity Assessment: Generalized weakness;RLE deficits/detail RLE Deficits / Details: Decreased strength and AROM consistent with IM nailing. Acute pain noted  with movement.     Cervical / Trunk Assessment: Kyphotic  Communication   Communication: Other (comment) (Intubated)  Cognition Arousal/Alertness: Awake/alert Behavior During Therapy: Flat affect Overall Cognitive Status: Difficult to assess                      General Comments      Exercises        Assessment/Plan    PT Assessment Patient needs continued PT services  PT Diagnosis Difficulty walking;Generalized weakness;Acute pain   PT Problem List Decreased strength;Decreased range of motion;Decreased activity tolerance;Decreased balance;Decreased mobility;Decreased knowledge of use of DME;Decreased safety awareness;Decreased knowledge of precautions;Cardiopulmonary status limiting activity;Pain  PT Treatment Interventions DME instruction;Gait training;Stair training;Functional mobility training;Therapeutic activities;Therapeutic exercise;Neuromuscular re-education;Patient/family education   PT Goals (Current goals can be found in the Care Plan section) Acute Rehab PT Goals Patient Stated Goal: Pt did not state goals during session.  PT Goal Formulation: Patient unable to participate in goal setting Time For Goal Achievement: 12/21/14 Potential to Achieve Goals: Fair    Frequency Min 3X/week   Barriers to discharge        Co-evaluation               End of Session Equipment Utilized During Treatment: Oxygen Activity Tolerance: Patient limited by fatigue Patient left: in bed;with call bell/phone within reach;Other (comment) Freight forwarder not present - staff alerted that therapy was exiting ) Nurse Communication: Mobility status         Time: 0939-1001 PT Time Calculation (min) (ACUTE ONLY): 22 min   Charges:   PT Evaluation $Initial PT Evaluation Tier I: 1 Procedure     PT G Codes:        Rolinda Roan 12/13/14, 10:24 AM   Rolinda Roan, PT, DPT Acute Rehabilitation Services Pager: [phone]

## 2014-12-07 NOTE — Clinical Social Work Note (Signed)
Clinical Social Worker spoke with patient's son, Ron in reference to post-acute placement for SNF. Pt's son and family still undecided on SNF placement. CSW reviewed PT recommendations for SNF with pt's son. Pt's son is agreeable to SNF search however will further discuss placement with the rest of the family and contact CSW as needed.   CSW to complete FL-2 and fax to SNF's in [place]. Pt's son ONLY prefers 1. [facility] 2. [place] 3. [place] remains available.   Glendon Axe, MSW, LCSWA [phone] 12/07/2014 4:02 PM

## 2014-12-08 DIAGNOSIS — J438 Other emphysema: Secondary | ICD-10-CM

## 2014-12-08 LAB — CBC
HCT: 24.1 % — ABNORMAL LOW (ref 36.0–46.0)
Hemoglobin: 7.4 g/dL — ABNORMAL LOW (ref 12.0–15.0)
MCH: 27.8 pg (ref 26.0–34.0)
MCHC: 30.7 g/dL (ref 30.0–36.0)
MCV: 90.6 fL (ref 78.0–100.0)
PLATELETS: 138 10*3/uL — AB (ref 150–400)
RBC: 2.66 MIL/uL — AB (ref 3.87–5.11)
RDW: 14.1 % (ref 11.5–15.5)
WBC: 7.2 10*3/uL (ref 4.0–10.5)

## 2014-12-08 LAB — TYPE AND SCREEN
ABO/RH(D): O POS
Antibody Screen: NEGATIVE
UNIT DIVISION: 0
Unit division: 0

## 2014-12-08 LAB — BASIC METABOLIC PANEL
ANION GAP: 11 (ref 5–15)
BUN: 70 mg/dL — ABNORMAL HIGH (ref 6–20)
CHLORIDE: 101 mmol/L (ref 101–111)
CO2: 30 mmol/L (ref 22–32)
CREATININE: 1.56 mg/dL — AB (ref 0.44–1.00)
Calcium: 7.3 mg/dL — ABNORMAL LOW (ref 8.9–10.3)
GFR calc Af Amer: 34 mL/min — ABNORMAL LOW (ref >60)
GFR calc non Af Amer: 29 mL/min — ABNORMAL LOW (ref >60)
Glucose, Bld: 90 mg/dL (ref 65–99)
Potassium: 3 mmol/L — ABNORMAL LOW (ref 3.5–5.1)
SODIUM: 142 mmol/L (ref 135–145)

## 2014-12-08 LAB — GLUCOSE, CAPILLARY
GLUCOSE-CAPILLARY: 145 mg/dL — AB (ref 65–99)
GLUCOSE-CAPILLARY: 92 mg/dL (ref 65–99)
GLUCOSE-CAPILLARY: 93 mg/dL (ref 65–99)
Glucose-Capillary: 91 mg/dL (ref 65–99)
Glucose-Capillary: 102 mg/dL — ABNORMAL HIGH (ref 65–99)
Glucose-Capillary: 167 mg/dL — ABNORMAL HIGH (ref 65–99)

## 2014-12-08 MED ORDER — POTASSIUM CHLORIDE CRYS ER 20 MEQ PO TBCR
20.0000 meq | EXTENDED_RELEASE_TABLET | Freq: Once | ORAL | Status: AC
  Administered 2014-12-08: 20 meq via ORAL
  Filled 2014-12-08: qty 1

## 2014-12-08 MED ORDER — METOPROLOL TARTRATE 25 MG PO TABS
25.0000 mg | ORAL_TABLET | Freq: Two times a day (BID) | ORAL | Status: DC
  Administered 2014-12-08 – 2014-12-09 (×4): 25 mg via ORAL
  Filled 2014-12-08 (×6): qty 1

## 2014-12-08 MED ORDER — SODIUM CHLORIDE 0.9 % IV BOLUS (SEPSIS): 250.0000 mL | Freq: Once | INTRAVENOUS | Status: DC

## 2014-12-08 MED ORDER — ADULT MULTIVITAMIN W/MINERALS CH
1.0000 | ORAL_TABLET | Freq: Every day | ORAL | Status: DC
  Administered 2014-12-08: 1 via ORAL
  Filled 2014-12-08 (×3): qty 1

## 2014-12-08 MED ORDER — POTASSIUM CHLORIDE 10 MEQ/100ML IV SOLN
10.0000 meq | INTRAVENOUS | Status: AC
  Administered 2014-12-08 (×2): 10 meq via INTRAVENOUS
  Filled 2014-12-08 (×2): qty 100

## 2014-12-08 NOTE — Evaluation (Signed)
Clinical/Bedside Swallow Evaluation Patient Details  Name: Monica Rhodes MRN: [id number] Date of Birth: 20-May-1928  Today's Date: 12/08/2014 Time: SLP Start Time (ACUTE ONLY): 0830 SLP Stop Time (ACUTE ONLY): 0855 SLP Time Calculation (min) (ACUTE ONLY): 25 min  Past Medical History:  Past Medical History  Diagnosis Date  . Hypertension   . UTI (urinary tract infection)   . SBO (small bowel obstruction)     Recurrent, prolonged hospitalization November, 2012  . Protein calorie malnutrition   . Hyperlipidemia   . DM (diabetes mellitus)   . COPD (chronic obstructive pulmonary disease)   . PVD (peripheral vascular disease)   . Colon cancer   . Bradycardia   . Tobacco abuse   . CAD (coronary artery disease)     Non-STEMI November, 2012,, Cardiac catheterization was attempted, despite significant efforts catheters could not be progressed to the coronary arteries ., Therefore medical therapy  . Carotid artery disease     Doppler  April 16, 2011,, total occlusion LICA, 87-86% R. ICA  . PAD (peripheral artery disease)     Suggestions significant stenosis distal aorta by CT angiogram  November, 2012  . Ejection fraction     EF 60%, echo, November, 2012,  . Mitral regurgitation     Moderate, echo, November, 2012  . Right ventricular dysfunction     Moderate, echo, November, 2012  . Edema     December, 2012  . Rash     Rash  probably related to hydralazine  December, 2012  . Hx of echocardiogram     Echo 3/16:  EF 35-40%, ant-lat HK, mod MR, mod LAE, mod to large pericardial effusion with no evidence of tamponade (unchanged from prior echo)  . PONV (postoperative nausea and vomiting)     post op confusion, combative  . CHF (congestive heart failure)    Past Surgical History:  Past Surgical History  Procedure Laterality Date  . Colostomy    . Abdominoperineal proctocolectomy    . Left heart catheterization with coronary angiogram N/A 04/24/2011    Procedure: LEFT HEART  CATHETERIZATION WITH CORONARY ANGIOGRAM;  Surgeon: Josue Hector, MD;  Location: [facility] CATH LAB;  Service: Cardiovascular;  Laterality: N/A;  . Intramedullary (im) nail intertrochanteric Right 11/11/2014    Procedure: INTRAMEDULLARY (IM) NAIL INTERTROCHANTRIC;  Surgeon: Renette Butters, MD;  Location: [facility];  Service: Orthopedics;  Laterality: Right;   HPI:  81 y.o. F brought to [facility] ED 6/23 with R hip pain after mechanical fall. Found to have displaced intertrochanteric fx. Transferred to [facility] at request of ortho MD. Developed hypoxia and tachypnea so placed on BiPAP. ABG revealed hypercarbic and hypoxic resp failure; Intubated from 6/24-6/27.   Assessment / Plan / Recommendation Clinical Impression  Pt presents with normal swallow function with no signs of silent aspiration or decreased airway protection. Pt does complain of lack of appetite and is reluctant to try POs. Mastication is slow with loose dentures and while masticating pt began expectorating mucous. Do not suspect any significant solid food dysphagia, though would recommend pt continue current liquid diet and advance to dys 3 (mechanical soft) when appetite improves. No SLP f/u needed.     Aspiration Risk  Mild    Diet Recommendation Dysphagia 3 (Mech soft);Thin   Medication Administration: Whole meds with liquid Compensations: Slow rate;Small sips/bites    Other  Recommendations Oral Care Recommendations: Oral care BID   Follow Up Recommendations       Frequency and Duration  Pertinent Vitals/Pain NA    SLP Swallow Goals     Swallow Study Prior Functional Status       General Other Pertinent Information: 81 y.o. F brought to [facility] ED 6/23 with R hip pain after mechanical fall. Found to have displaced intertrochanteric fx. Transferred to [facility] at request of ortho MD. Developed hypoxia and tachypnea so placed on BiPAP. ABG revealed hypercarbic and hypoxic resp failure; Intubated from 6/24-6/27. Type of Study:  Bedside swallow evaluation Previous Swallow Assessment: none Diet Prior to this Study: Thin liquids Temperature Spikes Noted: No Respiratory Status: Supplemental O2 delivered via (comment) History of Recent Intubation: Yes Length of Intubations (days): 4 days Date extubated: 12/07/14 Behavior/Cognition: Alert Oral Cavity - Dentition: Edentulous (dentures) Patient Positioning: Partially reclined Baseline Vocal Quality: Normal Volitional Cough: Congested Volitional Swallow: Able to elicit    Oral/Motor/Sensory Function Overall Oral Motor/Sensory Function: Appears within functional limits for tasks assessed   Ice Chips     Thin Liquid Thin Liquid: Within functional limits Presentation: Cup;Straw;Self Fed    Nectar Thick Nectar Thick Liquid: Not tested   Honey Thick Honey Thick Liquid: Not tested   Puree Puree: Within functional limits   Solid   GO    Solid: Impaired Oral Phase Impairments: Impaired mastication Oral Phase Functional Implications: Oral residue Other Comments: expectorated cracker       Monica Rhodes, Monica Rhodes 12/08/2014,9:06 AM

## 2014-12-08 NOTE — Clinical Social Work Note (Signed)
Clinical Social Worker met with patient 's family to reviewed and provide SNF list. Family is agreeable to SNF placement only at the following facilities: 1. [facility] 2. [facility] 3. [facility] and 4. [facility].   Pt's family very supportive and appreciated social work intervention.   CSW to complete FL-2 and fax to SNF's on [place] Fillers, MSW, [place] [phone] 12/08/2014 11:56 AM

## 2014-12-08 NOTE — Progress Notes (Signed)
Patient on a nasal cannula. Patient's sat is hanging around 93-94. She does not need Bi-PAP at this time. RT will continue to monitor closely.

## 2014-12-08 NOTE — Progress Notes (Signed)
eLink Physician-Brief Progress Note Patient Name: Monica Rhodes DOB: Aug 23, 1927 MRN: [id number]   Date of Service  12/08/2014  HPI/Events of Note  All day only 125cc urine op. HR 90s, RN thnks patient dehydrated. Chjart review ef 25% and patient looks ok  eICU Interventions  250cc saline bolus Saline lock after that     Intervention Category Intermediate Interventions: Oliguria - evaluation and management  [name] 12/08/2014, 5:34 PM

## 2014-12-08 NOTE — Progress Notes (Signed)
Pt was swallow with ease when given water and ice chips. Notified MD Oletta Darter in regards to this, and placed orders to progress diet. Will continue to monitor and assess.

## 2014-12-08 NOTE — Progress Notes (Signed)
Name: Monica Rhodes MRN: [id number] DOB: Mar 21, 1928    ADMISSION DATE:  11/17/2014 CONSULTATION DATE:  12/08/2014  REFERRING MD :  Humphrey Rolls  CHIEF COMPLAINT:  Hypercarbic respiratory failure  BRIEF PATIENT DESCRIPTION: 81 y.o. F brought to [facility] ED 6/23 with R hip pain after mechanical fall.  Found to have displaced intertrochanteric fx.  Transferred to [facility] at request of ortho MD.  Developed hypoxia and tachypnea so placed on BiPAP.  ABG revealed hypercarbic and hypoxic resp failure; after settings adjusted, hypercarbia not resolved so PCCM called for further recs.  SIGNIFICANT EVENTS  6/23 - admitted with right intertrochanteric fx. 6/24 - PCCM consulted for acute on chronic hypercapnic resp failure, ET Tplaced  6/25- improved pcxr, improved abg, neg 143 cc 6/26 - to OR for R hip repair  STUDIES:  CXR 6/23 >>> b/l opacities, pna vs edema.  Stable cardiomegaly and b/l pleural effusions. R Knee 6/23 >>> no acute findings. R hip 6/23 >>> acute right intertrochanteric fx. CT head / cspine 6/23 >>> no acute process.  SUBJECTIVE:  Tolerated extubation Interacting appropriately Denies pain  VITAL SIGNS: Temp:  [97.5 F (36.4 C)-99 F (37.2 C)] 97.6 F (36.4 C) (06/28 1225) Pulse Rate:  [52-137] 137 (06/28 1020) Resp:  [15-28] 19 (06/28 1000) BP: (93-121)/(37-93) 107/45 mmHg (06/28 1020) SpO2:  [90 %-100 %] 98 % (06/28 1000) Weight:  [62.6 kg (138 lb 0.1 oz)] 62.6 kg (138 lb 0.1 oz) (06/28 0500)  PHYSICAL EXAMINATION: General: Elderly female, resting in bed, rass 0 Neuro: rass 0, moves all ext, oriented x2 HEENT: jvd 6cm Cardiovascular: IRIR, no M/R/G.  Lungs: clear B Abdomen: BS wnl, soft, NT/ND.  Musculoskeletal: No gross deformities, 1+ pitting edema peripherally. Skin: Intact, warm, no rashes.   Recent Labs Lab 12/05/2014 0215 12/07/14 0209 12/08/14 0314  NA 144 145 142  K 3.1* 2.9* 3.0*  CL 103 105 101  CO2 33* 31 30  BUN 75* 74* 70*  CREATININE 1.67* 1.60* 1.56*   GLUCOSE 117* 126* 90    Recent Labs Lab 11/11/2014 0215 12/07/14 0209 12/08/14 0314  HGB 9.7* 8.3* 7.4*  HCT 30.9* 26.7* 24.1*  WBC 9.1 7.2 7.2  PLT 121* 107* 138*   Dg Chest Port 1 View  12/07/2014   CLINICAL DATA:  Pulmonary edema, history hypertension, hyperlipidemia, diabetes mellitus, COPD, coronary artery disease, CHF, former smoker  EXAM: PORTABLE CHEST - 1 VIEW  COMPARISON:  Portable exam 0500 hours compared to 11/18/2014  FINDINGS: Tip of endotracheal tube projects 4.9 cm above carina.  Nasogastric tube coiled in stomach.  Enlargement of cardiac silhouette.  Mediastinal contours and pulmonary vascularity normal.  Atelectasis versus consolidation LEFT lower lobe.  Probable bibasilar effusions.  Underlying emphysematous changes and central peribronchial thickening.  Atherosclerotic calcification aorta.  No pneumothorax.  Bones demineralized.  IMPRESSION: COPD changes with LEFT lower lobe atelectasis versus consolidation and probable bibasilar effusions.  Enlargement of cardiac silhouette.   Electronically Signed   By: Lavonia Dana M.D.   On: 12/07/2014 07:25   Dg Abd Portable 1v  11/30/2014   CLINICAL DATA:  Orogastric tube placement.  EXAM: PORTABLE ABDOMEN - 1 VIEW  COMPARISON:  Earlier this day at 1053 hr  FINDINGS: The enteric tube has been advanced, tip and side-port now below the diaphragm in the stomach. The tip is directed towards the cardia. There is a normal bowel gas pattern.  IMPRESSION: Tip and side port of the enteric tube below the diaphragm in the stomach.   Electronically  Signed   By: Jeb Levering M.D.   On: 12/07/2014 20:25    ASSESSMENT / PLAN:  PULMONARY OETT 6/24 >>>  6/27 A: Acute on chronic hypercapnic and hypoxemic respiratory failure. Pulmonary edema, improved Bilateral pleural effusions. R/o Asp PNA COPD by report - no PFT's on file. P:   Pul hygiene  pcxr better with diuresis Will discuss w patient and family whether she would want reintubation  should she decline  CARDIOVASCULAR A:  A.fib with RVR. Acute on chronic systolic heart failure - echo from March 2016 with EF 35 - 40%, mod MR, mod to large pericardial effusion (unchanged). Hx HLD, CAD, PAD, MR, known pericardial effusion. P:  Metoprolol Diuresis as she can tolerate tele She was deemed a poor candidate for anti-coag as an outpt, discussed with Dr Martinique. Stopped heparin gtt, changed to DVT prophylaxis.   RENAL A:   CKD III. hypoK P:   KVO IVF with hx LV dysfxn K replacement ordered  Follow BMP  GASTROINTESTINAL A:   GI prophylaxis. Nutrition. Constipation  Nausea  P:  SUP: Pantoprazole. Continue PO Bowel regimen  HEMATOLOGIC A:   VTE Prophylaxis. P:  SCD's. Follow CBC  INFECTIOUS resp 6/25 >> normal flora A:   R/o PNA, asp? P:   6/24 ceftriaxone (will also cover hip) >>> 6/28  Comfortable stopping empiric abx at this point  ENDOCRINE A:   DM.   P:   SSI. TSH. wnl  NEUROLOGIC A:   Acute encephalopathy. Resolved  P:   RASS goal: 0   MUSCULOSKELETAL A: Right intertrochanteric hip fx after mechanical fall. P:  Family updated: Son at bedside by me 6/27, 6/28  Interdisciplinary Family Meeting v Palliative Care Meeting:  Due by: 6/30.    Baltazar Apo, MD, PhD 12/08/2014, 12:52 PM [facility] [phone] or if no answer [phone]

## 2014-12-08 NOTE — Progress Notes (Signed)
     Subjective:  POD#2 R IM nail of the hip. Patient reports pain as mild.  PT worked with patient yesterday and recommend SNF at discharge.  The son would like to discuss placement with the rest of the family before deciding.  Due to increased respirations, PT was not able to mobilize beyond sitting at the side of the bed.   Objective:   VITALS:   Filed Vitals:   12/08/14 0500 12/08/14 0600 12/08/14 0700 12/08/14 0743  BP: 113/37 112/63 97/59   Pulse: 100 106 123   Temp:    98.6 F (37 C)  TempSrc:    Oral  Resp: 21 17 17    Height:      Weight: 62.6 kg (138 lb 0.1 oz)     SpO2: 96% 94% 93%    Confused at times but is able to follow commands.  ABD soft Neurovascular intact Sensation intact distally Intact pulses distally Dorsiflexion/Plantar flexion intact Incision: dressing C/D/I   Lab Results  Component Value Date   WBC 7.2 12/08/2014   HGB 7.4* 12/08/2014   HCT 24.1* 12/08/2014   MCV 90.6 12/08/2014   PLT 138* 12/08/2014   BMET    Component Value Date/Time   NA 142 12/08/2014 0314   K 3.0* 12/08/2014 0314   CL 101 12/08/2014 0314   CO2 30 12/08/2014 0314   GLUCOSE 90 12/08/2014 0314   BUN 70* 12/08/2014 0314   CREATININE 1.56* 12/08/2014 0314   CALCIUM 7.3* 12/08/2014 0314   GFRNONAA 29* 12/08/2014 0314   GFRAA 34* 12/08/2014 0314     Assessment/Plan: 2 Days Post-Op   Principal Problem:   Fracture, intertrochanteric, right femur Active Problems:   Hypertension   Hyperlipidemia   DM (diabetes mellitus)   COPD (chronic obstructive pulmonary disease)   CKD (chronic kidney disease) stage 3, GFR 30-59 ml/min   Chronic systolic CHF (congestive heart failure)   Atrial fibrillation and flutter   Pressure ulcer   Protein-calorie malnutrition, severe   Acute respiratory failure with hypoxia and hypercarbia   COLD (chronic obstructive lung disease)   Acute on chronic systolic CHF (congestive heart failure)   Up with therapy WBAT in the RLE DVT  prophylaxis per critical care team/medicine   Tommie Dejoseph Marie 12/08/2014, 9:10 AM Cell [phone]

## 2014-12-08 NOTE — Progress Notes (Signed)
eLink Physician-Brief Progress Note Patient Name: [name] DOB: December 12, 1927 MRN: [id number]   Date of Service  12/08/2014  HPI/Events of Note    eICU Interventions  Hypokalemia -repleted      Intervention Category Intermediate Interventions: Electrolyte abnormality - evaluation and management  ALVA,RAKESH V. 12/08/2014, 5:22 AM

## 2014-12-08 NOTE — Progress Notes (Signed)
Patient ID: Monica Rhodes, female   DOB: Feb 23, 1928, 81 y.o.   MRN: [id number]      Subjective:    Patient awake, alert, extubated. Denies SOB. Complains of abdominal discomfort/constipation. Some nausea today.  Objective:   Temp:  [97.5 F (36.4 C)-99 F (37.2 C)] 98.6 F (37 C) (06/28 0743) Pulse Rate:  [52-123] 123 (06/28 0700) Resp:  [15-28] 17 (06/28 0700) BP: (89-121)/(37-93) 97/59 mmHg (06/28 0700) SpO2:  [85 %-100 %] 93 % (06/28 0700) Weight:  [62.6 kg (138 lb 0.1 oz)] 62.6 kg (138 lb 0.1 oz) (06/28 0500) Last BM Date: 12/04/14  Filed Weights   11/18/2014 0442 12/07/14 0500 12/08/14 0500  Weight: 60 kg (132 lb 4.4 oz) 63.7 kg (140 lb 6.9 oz) 62.6 kg (138 lb 0.1 oz)    Intake/Output Summary (Last 24 hours) at 12/08/14 0938 Last data filed at 12/08/14 0700  Gross per 24 hour  Intake    638 ml  Output   1665 ml  Net  -1027 ml    Exam:  General: NAD, chronically ill appearing. Thin.   Resp: clear anteriorally  Cardiac:irreg, no m/r/g, no JVD  GI: abdomen soft, NT, ND, colostomy in place.  MSK: tr bilateral LE edema  Neuro: no focal deficits    Lab Results:  Basic Metabolic Panel:  Recent Labs Lab 11/24/2014 0215 11/21/2014 1439 12/07/14 0209 12/08/14 0314  NA 144  --  145 142  K 3.1*  --  2.9* 3.0*  CL 103  --  105 101  CO2 33*  --  31 30  GLUCOSE 117*  --  126* 90  BUN 75*  --  74* 70*  CREATININE 1.67*  --  1.60* 1.56*  CALCIUM 7.9*  --  7.6* 7.3*  MG 1.9 1.9 1.8  1.9  --     Liver Function Tests:  Recent Labs Lab 12/04/14 0654 12/08/2014 0215  AST 33 29  ALT 12* 9*  ALKPHOS 61 40  BILITOT 0.5 0.5  PROT 6.5 4.9*  ALBUMIN 3.1* 2.1*    CBC:  Recent Labs Lab 11/16/2014 0215 12/07/14 0209 12/08/14 0314  WBC 9.1 7.2 7.2  HGB 9.7* 8.3* 7.4*  HCT 30.9* 26.7* 24.1*  MCV 90.1 90.5 90.6  PLT 121* 107* 138*    Cardiac Enzymes: No results for input(s): CKTOTAL, CKMB, CKMBINDEX, TROPONINI in the last 168 hours.  BNP: No results  for input(s): PROBNP in the last 8760 hours.  Coagulation:  Recent Labs Lab 11/23/2014 1903 11/22/2014 0215  INR 1.02 1.25    Telemetry: Atrial fibrillation rate increased this am 125-135. Was well controlled throughout the day yesterday.    Medications:   Scheduled Medications: . antiseptic oral rinse  7 mL Mouth Rinse QID  . atorvastatin  20 mg Per Tube q1800  . cefTRIAXone (ROCEPHIN)  IV  1 g Intravenous Q24H  . chlorhexidine  15 mL Mouth Rinse BID  . feeding supplement (ENSURE ENLIVE)  237 mL Oral BID BM  . ferrous sulfate  325 mg Oral TID PC  . heparin subcutaneous  5,000 Units Subcutaneous 3 times per day  . insulin aspart  0-15 Units Subcutaneous 6 times per day  . metoprolol tartrate  25 mg Oral BID  . pantoprazole sodium  40 mg Per Tube Daily    Infusions: . sodium chloride 10 mL/hr at 12/07/14 1900  . feeding supplement (VITAL AF 1.2 CAL) Stopped (12/07/14 1100)  . fentaNYL infusion INTRAVENOUS Stopped (12/04/14 1245)    PRN  Medications: acetaminophen **OR** acetaminophen, albuterol, fentaNYL, fentaNYL (SUBLIMAZE) injection, fentaNYL (SUBLIMAZE) injection, fentaNYL (SUBLIMAZE) injection, hydrALAZINE, Linaclotide, menthol-cetylpyridinium **OR** phenol, methocarbamol **OR** methocarbamol (ROBAXIN)  IV, metoCLOPramide **OR** metoCLOPramide (REGLAN) injection, metoprolol, midazolam, midazolam, nitroGLYCERIN, ondansetron **OR** ondansetron (ZOFRAN) IV, polyethylene glycol, traMADol     Assessment/Plan    1. Acute systolic/diastolic HF - 2/64/15 echo LVEF 20-25%, diffuse hypokinesis, moderate MR - LVEF decreased from 08/2014 echo (LVEF was 35-40% at that time) - CXR improved pleural effusions and pulm edema 12/07/14. - volume negative 1012  mL yesterday, + 1.2 liters since admission. Renal function improved slightly.  - She is oxygenating fairly well on [place].  Lasix on hold for now due to soft BP and elevated creatinine.   2. Pericardial effusion - echo shows moderate to  large effusion posterolaterally without criteria for tamponade by echo, stable from prior study 3 months ago.   3. Afib - from primary cardiologists note she has not been a candidate for anticoagulation - rates currently increased, currently on prn IV metoprolol - She is not a candidate for long term anticoagulation.  - will start metoprolol 25 mg bid for rate control ( was on Coreg prior to admit but I think metoprolol will cause less hypotension and give better rate control.   4. Hip fracture - patient s/p surgery 11/11/2014.   5. Hypokalemia- replete   6. PVCs- improved today.  Annick Dimaio Martinique MD, [facility]  12/08/2014 9:38 AM

## 2014-12-08 NOTE — Progress Notes (Signed)
Nutrition Follow-up  DOCUMENTATION CODES:  Severe malnutrition in context of chronic illness  INTERVENTION:  Ensure Enlive (each supplement provides 350kcal and 20 grams of protein) BID  MVI daily  NUTRITION DIAGNOSIS:  Malnutrition related to chronic illness as evidenced by severe depletion of body fat, severe depletion of muscle mass.  Ongoing.   GOAL:  Patient will meet greater than or equal to 90% of their needs  Not met.   MONITOR:  Skin, I & O's, PO intake, Supplement acceptance, Labs  REASON FOR ASSESSMENT:  Consult Hip fracture protocol  ASSESSMENT:  Patient brought to [facility] ED on 6/23 with right hip pain after mechanical fall. Found to have displaced intertrochanteric fx. Transferred to [facility], developed hypoxia, and required intubation on 6/24. Hx includes HTN, HF, COPD, PVD, PAD, PCM and colostomy.   Pt extubated 6/27 and started on clear liquids. Per RN pt has not been taking much liquids from tray but has been drinking ensure. Pt not able to answer questions and seemed confused. Per RN has been sleepy.   Colostomy - no output recorded Stage II on coccyx Labs reviewed: Potassium low, BUN/Cr elevated, cbgs: 91-145  Height:  Ht Readings from Last 1 Encounters:  12/04/14 $RemoveB'5\' 1"'[id number]$  (1.549 m)    Weight:  Wt Readings from Last 1 Encounters:  12/08/14 138 lb 0.1 oz (62.6 kg)    Ideal Body Weight:  47.7 kg  Wt Readings from Last 10 Encounters:  12/08/14 138 lb 0.1 oz (62.6 kg)  09/30/14 124 lb (56.246 kg)  09/04/14 126 lb 12.8 oz (57.516 kg)  08/17/14 118 lb (53.524 kg)  08/11/14 117 lb 1 oz (53.1 kg)  04/18/12 124 lb 4.8 oz (56.382 kg)  11/28/11 119 lb 12.8 oz (54.341 kg)  08/01/11 119 lb (53.978 kg)  06/09/11 125 lb (56.7 kg)  04/27/11 135 lb 12.9 oz (61.6 kg)    BMI:  Body mass index is 26.09 kg/(m^2).  Estimated Nutritional Needs:  Kcal:  1400-1600  Protein:  80-95 grams  Fluid:  > 1.5 L/day  Skin:  Wound (see comment) (stage II to  coccyx)  Diet Order:  Diet clear liquid Room service appropriate?: Yes; Fluid consistency:: Thin; Fluid restriction:: Other (see comments)  EDUCATION NEEDS:  No education needs identified at this time   Intake/Output Summary (Last 24 hours) at 12/08/14 1626 Last data filed at 12/08/14 1400  Gross per 24 hour  Intake    430 ml  Output   1080 ml  Net   -650 ml    Last BM:  None documented via colostomy  [place], [place], [place] Pager [phone] After Hours Pager

## 2014-12-08 NOTE — Progress Notes (Signed)
Walked into room and noticed patient pushing around stoma and had removed colostomy bag. Pt had been complaining about nausea an hour ago. Zofran PO 4mg  was given. Notified Elink Nurse Ardelle Park in regards to this. Pt did have tenderness around the site and the site is also firm. Elink Nurse Ardelle Park looked into the room via camera and said the site look fine and normal. Site is not inflamed. Patient has active bowel sounds. Changed Colostomy bag as well. Will continue to monitor and assess.

## 2014-12-09 DIAGNOSIS — S72141D Displaced intertrochanteric fracture of right femur, subsequent encounter for closed fracture with routine healing: Secondary | ICD-10-CM

## 2014-12-09 DIAGNOSIS — R410 Disorientation, unspecified: Secondary | ICD-10-CM | POA: Diagnosis present

## 2014-12-09 LAB — BASIC METABOLIC PANEL
Anion gap: 13 (ref 5–15)
BUN: 77 mg/dL — ABNORMAL HIGH (ref 6–20)
CHLORIDE: 100 mmol/L — AB (ref 101–111)
CO2: 30 mmol/L (ref 22–32)
Calcium: 7.9 mg/dL — ABNORMAL LOW (ref 8.9–10.3)
Creatinine, Ser: 2.17 mg/dL — ABNORMAL HIGH (ref 0.44–1.00)
GFR calc Af Amer: 23 mL/min — ABNORMAL LOW (ref >60)
GFR calc non Af Amer: 20 mL/min — ABNORMAL LOW (ref >60)
GLUCOSE: 122 mg/dL — AB (ref 65–99)
POTASSIUM: 4.1 mmol/L (ref 3.5–5.1)
SODIUM: 143 mmol/L (ref 135–145)

## 2014-12-09 LAB — CBC
HCT: 25.6 % — ABNORMAL LOW (ref 36.0–46.0)
Hemoglobin: 7.7 g/dL — ABNORMAL LOW (ref 12.0–15.0)
MCH: 27.6 pg (ref 26.0–34.0)
MCHC: 30.1 g/dL (ref 30.0–36.0)
MCV: 91.8 fL (ref 78.0–100.0)
PLATELETS: 195 10*3/uL (ref 150–400)
RBC: 2.79 MIL/uL — AB (ref 3.87–5.11)
RDW: 14.3 % (ref 11.5–15.5)
WBC: 9.7 10*3/uL (ref 4.0–10.5)

## 2014-12-09 LAB — POCT I-STAT 3, ART BLOOD GAS (G3+)
Acid-Base Excess: 1 mmol/L (ref 0.0–2.0)
BICARBONATE: 27 meq/L — AB (ref 20.0–24.0)
O2 Saturation: 96 %
PCO2 ART: 46.2 mmHg — AB (ref 35.0–45.0)
PO2 ART: 85 mmHg (ref 80.0–100.0)
TCO2: 28 mmol/L (ref 0–100)
pH, Arterial: 7.373 (ref 7.350–7.450)

## 2014-12-09 MED ORDER — SODIUM CHLORIDE 0.9 % IV SOLN
INTRAVENOUS | Status: DC
  Administered 2014-12-09: 60 mL/h via INTRAVENOUS

## 2014-12-09 MED ORDER — ATORVASTATIN CALCIUM 20 MG PO TABS
20.0000 mg | ORAL_TABLET | Freq: Every day | ORAL | Status: DC
  Administered 2014-12-09: 20 mg via ORAL
  Filled 2014-12-09 (×2): qty 1

## 2014-12-09 MED ORDER — QUETIAPINE 12.5 MG HALF TABLET
12.5000 mg | ORAL_TABLET | Freq: Every day | ORAL | Status: DC
  Filled 2014-12-09 (×2): qty 1

## 2014-12-09 MED ORDER — ALBUTEROL SULFATE (2.5 MG/3ML) 0.083% IN NEBU: 2.5000 mg | INHALATION_SOLUTION | RESPIRATORY_TRACT | Status: DC | PRN

## 2014-12-09 MED ORDER — PANTOPRAZOLE SODIUM 40 MG PO PACK
40.0000 mg | PACK | Freq: Every day | ORAL | Status: DC
  Filled 2014-12-09: qty 20

## 2014-12-09 MED ORDER — CETYLPYRIDINIUM CHLORIDE 0.05 % MT LIQD
7.0000 mL | Freq: Two times a day (BID) | OROMUCOSAL | Status: DC
  Administered 2014-12-09 (×2): 7 mL via OROMUCOSAL

## 2014-12-09 MED ORDER — METOPROLOL TARTRATE 1 MG/ML IV SOLN
2.5000 mg | Freq: Once | INTRAVENOUS | Status: AC
  Administered 2014-12-09: 2.5 mg via INTRAVENOUS

## 2014-12-09 NOTE — Progress Notes (Signed)
Patient ID: Monica Rhodes, female   DOB: [birth year], 81 y.o.   MRN: [id number]      Subjective:    Patient is more confused today. Doesn't know where she is. Thinks her kids put her here to make money.  Denies SOB.   Objective:   Temp:  [97.6 F (36.4 C)-99.1 F (37.3 C)] 98.3 F (36.8 C) (06/29 0754) Pulse Rate:  [37-137] 77 (06/29 0754) Resp:  [14-34] 24 (06/29 0754) BP: (90-118)/(37-58) 116/45 mmHg (06/29 0754) SpO2:  [93 %-100 %] 94 % (06/29 0754) Weight:  [61.6 kg (135 lb 12.9 oz)] 61.6 kg (135 lb 12.9 oz) (06/28 2128) Last BM Date: 12/04/14  Filed Weights   12/07/14 0500 12/08/14 0500 12/08/14 2128  Weight: 63.7 kg (140 lb 6.9 oz) 62.6 kg (138 lb 0.1 oz) 61.6 kg (135 lb 12.9 oz)    Intake/Output Summary (Last 24 hours) at 12/09/14 0930 Last data filed at 12/09/14 0600  Gross per 24 hour  Intake    160 ml  Output    275 ml  Net   -115 ml    Exam:  General: NAD, chronically ill appearing. Thin.   Resp: clear anteriorally  Cardiac:irreg, no m/r/g, JVD to 10 cm.   GI: abdomen soft, NT, ND, colostomy in place.  MSK: tr bilateral LE edema  Neuro: no focal deficits    Lab Results:  Basic Metabolic Panel:  Recent Labs Lab 11/15/2014 0215 11/29/2014 1439 12/07/14 0209 12/08/14 0314 12/09/14 0220  NA 144  --  145 142 143  K 3.1*  --  2.9* 3.0* 4.1  CL 103  --  105 101 100*  CO2 33*  --  31 30 30   GLUCOSE 117*  --  126* 90 122*  BUN 75*  --  74* 70* 77*  CREATININE 1.67*  --  1.60* 1.56* 2.17*  CALCIUM 7.9*  --  7.6* 7.3* 7.9*  MG 1.9 1.9 1.8  1.9  --   --     Liver Function Tests:  Recent Labs Lab 12/04/14 0654 11/16/2014 0215  AST 33 29  ALT 12* 9*  ALKPHOS 61 40  BILITOT 0.5 0.5  PROT 6.5 4.9*  ALBUMIN 3.1* 2.1*    CBC:  Recent Labs Lab 12/07/14 0209 12/08/14 0314 12/09/14 0220  WBC 7.2 7.2 9.7  HGB 8.3* 7.4* 7.7*  HCT 26.7* 24.1* 25.6*  MCV 90.5 90.6 91.8  PLT 107* 138* 195    Cardiac Enzymes: No results for input(s):  CKTOTAL, CKMB, CKMBINDEX, TROPONINI in the last 168 hours.  BNP: No results for input(s): PROBNP in the last 8760 hours.  Coagulation:  Recent Labs Lab 11/11/2014 1903 11/16/2014 0215  INR 1.02 1.25    Telemetry: Atrial fibrillation rate improved 100.  Was well controlled throughout the day yesterday.    Medications:   Scheduled Medications: . antiseptic oral rinse  7 mL Mouth Rinse BID  . atorvastatin  20 mg Per Tube q1800  . feeding supplement (ENSURE ENLIVE)  237 mL Oral BID BM  . ferrous sulfate  325 mg Oral TID PC  . heparin subcutaneous  5,000 Units Subcutaneous 3 times per day  . metoprolol tartrate  25 mg Oral BID  . multivitamin with minerals  1 tablet Oral Daily  . pantoprazole sodium  40 mg Per Tube Daily  . sodium chloride  250 mL Intravenous Once    Infusions: . feeding supplement (VITAL AF 1.2 CAL) Stopped (12/07/14 1100)    PRN Medications: acetaminophen **OR**  acetaminophen, albuterol, fentaNYL, fentaNYL (SUBLIMAZE) injection, fentaNYL (SUBLIMAZE) injection, hydrALAZINE, Linaclotide, menthol-cetylpyridinium **OR** phenol, methocarbamol **OR** methocarbamol (ROBAXIN)  IV, metoCLOPramide **OR** metoCLOPramide (REGLAN) injection, metoprolol, midazolam, midazolam, nitroGLYCERIN, ondansetron **OR** ondansetron (ZOFRAN) IV, polyethylene glycol, traMADol     Assessment/Plan    1. Acute systolic/diastolic HF - 10/05/81 echo LVEF 20-25%, diffuse hypokinesis, moderate MR - LVEF decreased from 08/2014 echo (LVEF was 35-40% at that time) - CXR improved pleural effusions and pulm edema 12/07/14. - volume negative 85  mL yesterday, + 1.1 liters since admission. Renal function worse today and urine output decreased. She was given 250 cc bolus of fluid yesterday PM.  - She is oxygenating fairly well on [place].  She does have significant JVD but otherwise does not appear volume overloaded. Continue to hold Lasix.   2. Pericardial effusion - echo shows moderate to large effusion  posterolaterally without criteria for tamponade by echo, stable from prior study 3 months ago.   3. Afib - from primary cardiologists note she has not been a candidate for anticoagulation - rates currently well controlled on po metoprolol.  - She is not a candidate for long term anticoagulation.  - continue metoprolol 25 mg bid for rate control ( was on Coreg prior to admit but I think metoprolol will cause less hypotension and give better rate control.   4. Hip fracture - patient s/p surgery 11/30/2014.   5. Hypokalemia- repleted  6. Acute on chronic renal failure. Creatinine increased to 2.17 today. Po intake appears poor. May need IVF but will defer to CCM.   Monica Reierson Martinique MD, [facility]  12/09/2014 9:30 AM

## 2014-12-09 NOTE — Clinical Social Work Note (Signed)
CSW completed FL2 and faxed out patient to [facility].  CSW to continue following patient throughout discharge planning.  Jones Broom. Clemmons, MSW, [place] 12/09/2014 6:07 PM

## 2014-12-09 NOTE — Progress Notes (Signed)
     Subjective:  POD#3 R IM nail to the hip. Patient reports pain as mild.  Resting comfortably in bed.  It doesn't appear that PT worked with the patient yesterday.  Will contact them to make sure she has a session today.   Objective:   VITALS:   Filed Vitals:   12/09/14 0400 12/09/14 0500 12/09/14 0600 12/09/14 0754  BP: 117/51 103/51 114/45 116/45  Pulse: 96   77  Temp:    98.3 F (36.8 C)  TempSrc:    Oral  Resp: 21 21 23 24   Height:      Weight:      SpO2: 95%   94%    ABD soft Neurovascular intact Sensation intact distally Intact pulses distally Dorsiflexion/Plantar flexion intact Incision: dressing C/D/I   Lab Results  Component Value Date   WBC 9.7 12/09/2014   HGB 7.7* 12/09/2014   HCT 25.6* 12/09/2014   MCV 91.8 12/09/2014   PLT 195 12/09/2014   BMET    Component Value Date/Time   NA 143 12/09/2014 0220   K 4.1 12/09/2014 0220   CL 100* 12/09/2014 0220   CO2 30 12/09/2014 0220   GLUCOSE 122* 12/09/2014 0220   BUN 77* 12/09/2014 0220   CREATININE 2.17* 12/09/2014 0220   CALCIUM 7.9* 12/09/2014 0220   GFRNONAA 20* 12/09/2014 0220   GFRAA 23* 12/09/2014 0220     Assessment/Plan: 3 Days Post-Op   Principal Problem:   Fracture, intertrochanteric, right femur Active Problems:   Hypertension   Hyperlipidemia   DM (diabetes mellitus)   COPD (chronic obstructive pulmonary disease)   CKD (chronic kidney disease) stage 3, GFR 30-59 ml/min   Chronic systolic CHF (congestive heart failure)   Atrial fibrillation and flutter   Pressure ulcer   Protein-calorie malnutrition, severe   Acute respiratory failure with hypoxia and hypercarbia   COLD (chronic obstructive lung disease)   Acute on chronic systolic CHF (congestive heart failure)   Up with therapy WBAT in RLE Lovenox per medicine/critical care team for DVT prophylaxis   Harjit Douds Marie 12/09/2014, 8:08 AM Cell [phone]

## 2014-12-09 NOTE — Progress Notes (Signed)
PT Cancellation Note  Patient Details Name: TIMARIE LABELL MRN: [id number] DOB: 03/10/1928   Cancelled Treatment:    Reason Eval/Treat Not Completed: Medical issues which prohibited therapy; RN reports pt with episode of rapid a-fib earlier, requested to cancel PT for today.  Will attempt tomorrow.   WYNN,CYNDI 12/09/2014, 2:53 PM  Magda Kiel, [place] 12/09/2014

## 2014-12-09 NOTE — Progress Notes (Signed)
Patient c/o shortness of breath. Heart rate has increased to 150. Reported to Dr. Martinique. Orders received.

## 2014-12-09 NOTE — Progress Notes (Signed)
Pt more lethargic tonight than last night, BP 88/40 HR 100s O2 sats 100% on 6L RR 35-40, called [facility], orders for ABG and hold seroquel dose. Will continue to monitor closely.

## 2014-12-09 NOTE — Progress Notes (Signed)
[place] TEAM 1 - Stepdown/ICU TEAM Progress Note  Monica Rhodes Rhodes:[id number] DOB: 1927/06/16 DOA: 11/27/2014 PCP: Elyn Peers, MD  Admit HPI / Brief Narrative: 81 y.o. F brought to [facility] ED 6/23 with R hip pain after mechanical fall. Found to have displaced intertrochanteric fx. Transferred to [facility] at request of Ortho MD. Developed hypoxia and tachypnea so placed on BiPAP. ABG revealed hypercarbic and hypoxic resp failure.  Significant Events: 6/23 - admitted with right intertrochanteric fx. 6/24 - PCCM consulted for acute on chronic hypercapnic resp failure, ETT placed  6/25 - improved pcxr, improved abg, neg 143 cc 6/26 - to OR for R hip repair  HPI/Subjective: The patient is alert but confused.  She cannot provide a reliable history.  She is not able to tell me where she is or why she is here.  Assessment/Plan:  Acute on chronic hypercapnic and hypoxemic respiratory failure Respirations appear stable and comfortable at present but the patient continues to require 5 L of nasal cannula oxygen - attempt to wean oxygen as able  Pulmonary edema Improved  Acute delirium - encephalopathy Likely ICU delirium - check B-12/folic acid levels - attempt to avoid benzodiazepine's - trial of scheduled low-dose nightly Seroquel  Atrial fibrillation with RVR Not felt to be an anticoagulation candidate due to falls - rate control being managed by Cardiology  Acute on chronic systolic congestive heart failure EF 35-40 percent per TTE March 2016 - TTE 6/24 EF 20-25 percent with diffuse hypokinesis and moderate MR - Cardiology following  Known moderate to large pericardial effusion Reportedly stable on serial TTE  Normocytic anemia Probably reflective of poor nutritional status plus small-volume perioperative loss - follow trend  Acute renal failure on CKD stage III Creatinine increased today in setting of diuresis plus poor intake due to confusion - gently hydrate and  follow  Hypokalemia Resolved with supplementation  DM CBG well controlled - follow  Right intertrochanteric hip fracture following mechanical fall S/p R IM nail 6/26  Severe malnutrition in context of chronic illness  Code Status: FULL Family Communication: no family present at time of exam Disposition Plan: SDU  Consultants: Cardiology PCCM  > [facility] [place] [facility]  Antibiotics: Ceftriaxone 6/25 > 6/28  DVT prophylaxis: Subcutaneous heparin  Objective: Blood pressure 109/40, pulse 77, temperature 97.3 F (36.3 C), temperature source Oral, resp. rate 32, height 5\' 2"  (1.575 m), weight 61.6 kg (135 lb 12.9 oz), SpO2 97 %.  Intake/Output Summary (Last 24 hours) at 12/09/14 1621 Last data filed at 12/09/14 1130  Gross per 24 hour  Intake    150 ml  Output    160 ml  Net    -10 ml   Exam: General: No acute respiratory distress - confused Lungs: Clear to auscultation bilaterally without wheezes or crackles Cardiovascular: Tachycardic at approximately 110 bpm without appreciable gallop or rub Abdomen: Nontender, nondistended, soft, bowel sounds positive, no rebound, no ascites, no appreciable mass Extremities: No significant cyanosis, clubbing, or edema bilateral lower extremities  Data Reviewed: Basic Metabolic Panel:  Recent Labs Lab 12/04/14 0654  12/05/14 0220 12/05/14 1355 11/20/2014 0215 11/11/2014 1439 12/07/14 0209 12/08/14 0314 12/09/14 0220  NA 142  --   --   --  144  --  145 142 143  K 4.5  --   --   --  3.1*  --  2.9* 3.0* 4.1  CL 98*  --   --   --  103  --  105 101 100*  CO2  34*  --   --   --  33*  --  31 30 30   GLUCOSE 142*  --   --   --  117*  --  126* 90 122*  BUN 49*  --   --   --  75*  --  74* 70* 77*  CREATININE 1.61*  --   --   --  1.67*  --  1.60* 1.56* 2.17*  CALCIUM 8.5*  --   --   --  7.9*  --  7.6* 7.3* 7.9*  MG  --   < > 1.9 1.9 1.9 1.9 1.8  1.9  --   --   PHOS  --   < > 4.7* 4.7* 5.2* 6.0* 5.4*  5.5*  --   --   < > = values in  this interval not displayed.  CBC:  Recent Labs Lab 11/23/2014 1903 12/04/14 5361 11/14/2014 0215 12/07/14 0209 12/08/14 0314 12/09/14 0220  WBC 7.3 7.9 9.1 7.2 7.2 9.7  NEUTROABS 5.3 6.6 7.0 5.4  --   --   HGB 9.6* 9.8* 9.7* 8.3* 7.4* 7.7*  HCT 32.7* 33.2* 30.9* 26.7* 24.1* 25.6*  MCV 92.6 93.0 90.1 90.5 90.6 91.8  PLT 168 153 121* 107* 138* 195    Liver Function Tests:  Recent Labs Lab 12/04/14 0654 11/12/2014 0215  AST 33 29  ALT 12* 9*  ALKPHOS 61 40  BILITOT 0.5 0.5  PROT 6.5 4.9*  ALBUMIN 3.1* 2.1*    Coags:  Recent Labs Lab 11/20/2014 1903 11/11/2014 0215  INR 1.02 1.25    Recent Labs Lab 12/06/14 0215  APTT 32    CBG:  Recent Labs Lab 12/08/14 0424 12/08/14 0741 12/08/14 1223 12/08/14 1541 12/08/14 1926  GLUCAP 93 91 102* 145* 167*    Recent Results (from the past 240 hour(s))  Surgical pcr screen     Status: Abnormal   Collection Time: 12/04/14 12:19 AM  Result Value Ref Range Status   MRSA, PCR NEGATIVE NEGATIVE Final   Staphylococcus aureus POSITIVE (A) NEGATIVE Final    Comment:        The Xpert SA Assay (FDA approved for NASAL specimens in patients over 16 years of age), is one component of a comprehensive surveillance program.  Test performance has been validated by [facility] for patients greater than or equal to 65 year old. It is not intended to diagnose infection nor to guide or monitor treatment.   Culture, respiratory (NON-Expectorated)     Status: None   Collection Time: 12/05/14  9:28 AM  Result Value Ref Range Status   Specimen Description TRACHEAL ASPIRATE  Final   Special Requests NONE  Final   Gram Stain   Final    MODERATE WBC PRESENT,BOTH PMN AND MONONUCLEAR NO SQUAMOUS EPITHELIAL CELLS SEEN RARE GRAM POSITIVE COCCI IN PAIRS Performed at [facility]    Culture   Final    Non-Pathogenic Oropharyngeal-type Flora Isolated. Performed at [facility]    Report Status 12/07/2014 FINAL  Final      Studies:   Recent x-ray studies have been reviewed in detail by the Attending Physician  Scheduled Meds:  Scheduled Meds: . antiseptic oral rinse  7 mL Mouth Rinse BID  . atorvastatin  20 mg Per Tube q1800  . feeding supplement (ENSURE ENLIVE)  237 mL Oral BID BM  . ferrous sulfate  325 mg Oral TID PC  . heparin subcutaneous  5,000 Units Subcutaneous 3 times per day  .  metoprolol tartrate  25 mg Oral BID  . multivitamin with minerals  1 tablet Oral Daily  . pantoprazole sodium  40 mg Per Tube Daily  . sodium chloride  250 mL Intravenous Once    Time spent on care of this patient: 35 mins   Gwyndolyn Guilford T , MD   [facility] Office  [phone] Pager - Text Page per Shea Evans as per below:  On-Call/Text Page:      [email or web address]      password [email or web address]  If 7PM-7AM, please contact night-coverage [email or web address] Password [email or web address] 12/09/2014, 4:21 PM   LOS: 6 days

## 2014-12-09 NOTE — Progress Notes (Signed)
Orthopedic Tech Progress Note Patient Details:  Monica Rhodes December 03, 1927 [id number]  Patient ID: Monica Rhodes, female   DOB: [birth year], 81 y.o.   MRN: [id number] Pt unable to use trapeze bar patient helper  Hildred Priest 12/09/2014, 7:26 AM

## 2014-12-10 ENCOUNTER — Inpatient Hospital Stay (HOSPITAL_COMMUNITY): Payer: Medicare Other

## 2014-12-10 DIAGNOSIS — IMO0002 Reserved for concepts with insufficient information to code with codable children: Secondary | ICD-10-CM | POA: Diagnosis present

## 2014-12-10 DIAGNOSIS — R69 Illness, unspecified: Secondary | ICD-10-CM

## 2014-12-10 DIAGNOSIS — Z515 Encounter for palliative care: Secondary | ICD-10-CM

## 2014-12-10 DIAGNOSIS — R0602 Shortness of breath: Secondary | ICD-10-CM

## 2014-12-10 LAB — CBC
HEMATOCRIT: 26.2 % — AB (ref 36.0–46.0)
Hemoglobin: 8 g/dL — ABNORMAL LOW (ref 12.0–15.0)
MCH: 27.5 pg (ref 26.0–34.0)
MCHC: 30.5 g/dL (ref 30.0–36.0)
MCV: 90 fL (ref 78.0–100.0)
PLATELETS: 214 10*3/uL (ref 150–400)
RBC: 2.91 MIL/uL — ABNORMAL LOW (ref 3.87–5.11)
RDW: 14.2 % (ref 11.5–15.5)
WBC: 10 10*3/uL (ref 4.0–10.5)

## 2014-12-10 LAB — POCT I-STAT, CHEM 8
BUN: 98 mg/dL — AB (ref 6–20)
CALCIUM ION: 1.09 mmol/L — AB (ref 1.13–1.30)
Chloride: 101 mmol/L (ref 101–111)
Creatinine, Ser: 3.3 mg/dL — ABNORMAL HIGH (ref 0.44–1.00)
GLUCOSE: 108 mg/dL — AB (ref 65–99)
HCT: 24 % — ABNORMAL LOW (ref 36.0–46.0)
Hemoglobin: 8.2 g/dL — ABNORMAL LOW (ref 12.0–15.0)
Potassium: 5.3 mmol/L — ABNORMAL HIGH (ref 3.5–5.1)
SODIUM: 140 mmol/L (ref 135–145)
TCO2: 25 mmol/L (ref 0–100)

## 2014-12-10 LAB — COMPREHENSIVE METABOLIC PANEL
ALT: 625 U/L — ABNORMAL HIGH (ref 14–54)
AST: 4053 U/L — AB (ref 15–41)
Albumin: 2.1 g/dL — ABNORMAL LOW (ref 3.5–5.0)
Alkaline Phosphatase: 74 U/L (ref 38–126)
Anion gap: 15 (ref 5–15)
BUN: 91 mg/dL — ABNORMAL HIGH (ref 6–20)
CO2: 27 mmol/L (ref 22–32)
CREATININE: 3.24 mg/dL — AB (ref 0.44–1.00)
Calcium: 8.2 mg/dL — ABNORMAL LOW (ref 8.9–10.3)
Chloride: 101 mmol/L (ref 101–111)
GFR calc Af Amer: 14 mL/min — ABNORMAL LOW (ref >60)
GFR, EST NON AFRICAN AMERICAN: 12 mL/min — AB (ref >60)
GLUCOSE: 118 mg/dL — AB (ref 65–99)
Potassium: 5.6 mmol/L — ABNORMAL HIGH (ref 3.5–5.1)
Sodium: 143 mmol/L (ref 135–145)
Total Bilirubin: 1 mg/dL (ref 0.3–1.2)
Total Protein: 5.3 g/dL — ABNORMAL LOW (ref 6.5–8.1)

## 2014-12-10 LAB — BLOOD GAS, ARTERIAL
ACID-BASE DEFICIT: 1 mmol/L (ref 0.0–2.0)
Bicarbonate: 23.8 mEq/L (ref 20.0–24.0)
Drawn by: 252031
FIO2: 100 %
O2 Saturation: 99.3 %
PATIENT TEMPERATURE: 98.6
TCO2: 25.2 mmol/L (ref 0–100)
pCO2 arterial: 44.2 mmHg (ref 35.0–45.0)
pH, Arterial: 7.351 (ref 7.350–7.450)
pO2, Arterial: 163 mmHg — ABNORMAL HIGH (ref 80.0–100.0)

## 2014-12-10 LAB — AMMONIA: Ammonia: 28 umol/L (ref 9–35)

## 2014-12-10 MED ORDER — SODIUM CHLORIDE 0.9 % IV BOLUS (SEPSIS)
500.0000 mL | Freq: Once | INTRAVENOUS | Status: AC
  Administered 2014-12-10: 500 mL via INTRAVENOUS

## 2014-12-10 MED ORDER — HYDROMORPHONE HCL 1 MG/ML IJ SOLN
0.5000 mg | INTRAMUSCULAR | Status: DC | PRN
  Administered 2014-12-10 (×2): 0.5 mg via INTRAVENOUS
  Filled 2014-12-10 (×2): qty 1

## 2014-12-10 MED ORDER — ACETAMINOPHEN 650 MG RE SUPP: 650.0000 mg | Freq: Four times a day (QID) | RECTAL | Status: DC | PRN

## 2014-12-10 MED ORDER — SODIUM CHLORIDE 0.9 % IV SOLN
0.5000 mg/h | INTRAVENOUS | Status: DC
  Administered 2014-12-10: 0.5 mg/h via INTRAVENOUS
  Filled 2014-12-10: qty 2.5

## 2014-12-10 MED ORDER — LORAZEPAM 2 MG/ML IJ SOLN
0.5000 mg | INTRAMUSCULAR | Status: DC | PRN
  Administered 2014-12-10: 0.5 mg via INTRAVENOUS
  Filled 2014-12-10: qty 1

## 2014-12-10 MED ORDER — LORAZEPAM 2 MG/ML IJ SOLN: 0.5000 mg | INTRAMUSCULAR | Status: DC | PRN

## 2014-12-10 MED ORDER — HYDROMORPHONE BOLUS VIA INFUSION
0.5000 mg | INTRAVENOUS | Status: DC | PRN
  Filled 2014-12-10: qty 1

## 2014-12-10 MED ORDER — GLYCOPYRROLATE 0.2 MG/ML IJ SOLN
0.4000 mg | INTRAMUSCULAR | Status: DC | PRN
Start: 2014-12-10 — End: 2014-12-10
  Filled 2014-12-10: qty 2

## 2014-12-11 NOTE — Progress Notes (Addendum)
49cc of dilaudid wasted in sink; witnessed by 2 RNs;  Adella Hare, RN

## 2014-12-11 NOTE — Progress Notes (Addendum)
Shift event: RN, Lexi, paged this NP earlier in shift secondary to pt being more lethargic than usual. RR at times in the 30s but O2 sat has remained normal. ABG obtained due to recent hx of respiratory failure on vent. ABG without hypercarbia. Pt has had no sedative/pain meds tonight. Pt's BP was soft, but came up some. Pt perked up a bit. Now, RN paged this NP because pt is even more lethargic and strange acting. Slow to respond to questions and commands.  NP to bedside. BP 90s (improved). HR 100s. RR 23-30 but no increased WOB. O2 sat mid to upper 90s on 6L [place].  S: pt can not participate in ROS.  O: Very frail, chronically ill appearing elderly WF in NAD. She is awake. Very slow to respond to questions. Doesn't conversate. Can tell me her name and her birthday, but doesn't know where she is. When asked further questions, she just repeats her birthday. Doesn't consistently follow any commands. After several minutes of prodding, she did lift her right arm. No spontaneous movement noted to LEs. PERRL. She doesn't cooperate for any part of the neuro exam. She blankly stares and doesn't turn toward writer's voice when spoken to.  A/P: 1. Altered mental status ? Etiology-creat has worsened, recheck now. Check ammonia. Recheck CBC and CMP now. Check CT head to r/o acute issue. She hasn't had any sedative medications in over 15 hours.   Will follow after tests.  Clance Boll, NP [facility] Update: CT head neg for acute. Labs being drawn now with new IV start. BP 70s now. EF 20% last echo, but she doesn't appear overloaded. 500cc bolus slow and check CXR. May need to give albumin.  KJKG, NP Update: Pt knows what city she is in and knows her name and birthday now. Still lethargic and ? desatting into the 80s at times. (probe has been changed and sites rotated to get better wave form). Given ? hypoxia, place on NRB and r/p ABG. BUN and creatinine have both climbed. Suspect pt is somewhat dry. Total  of about 600cc IVF given so far with goal of 1L.   KJKG, NP Update: ABG showed PO2 165, normal PCO2 and pH, so doubt low O2 sat readings from tonight. When CMP resulted LFTs are significantly elevated with AST>4000 (was 29). This is probably shock liver from heart failure. Called Cardiology and they haven't responded yet.  Spoke to pt's son who will come to hospital now. But after conversing about gravity of situation (heart failure, worsening kidney failure, and now liver failure), he agreed to a DNR/DNI. Will discuss more in detail when he arrives, but this NP basically informed him that there may not be much else we can do at this point given her MSOF.  Spoke to PCCM briefly and after discussing pt/case/labs, etc., Dr. Emmit Alexanders believes pt to be in heart failure with organ failure as a result of hypoperfusion (EF 20%). Given new DNR status, will not consult formally at this time.  [facility], NP Update: Pt 's son, Mychal Durio, arrived. Discussed everything with son, including heart failure and now kidney and liver failure/shock. Informed son that this NP highly doubts she will survive this given her frail state and her multiple comorbidities. Son is very understanding and reasonable and wants to make sure his mother is comfortable. At this time, he agrees with withdrawing care and making focus comfort. Son calling in other family members. Pt allergic to MSO4. Will start Dilaudid prn for pain and  Ativan prn for restlessness.  Clance Boll, NP [facility]

## 2014-12-11 NOTE — Progress Notes (Signed)
Pt BP dropped 74/39 and even more lethargic than earlier in shift, paged Baltazar Najjar - came to bedside, Orders for CT, labs, give 500 bolus X 2, got EKG, and obtain ABG. Pt now on non-rebreather, sats 92%, RR 20-30s, BP 86/44. Will continue to monitor closely.

## 2014-12-11 NOTE — Progress Notes (Addendum)
Pt death pronounced at 1100am. No heart tones ascultated by 2 RNs for 1 full minute. Adella Hare, RN

## 2014-12-11 NOTE — Progress Notes (Addendum)
Removed 2mg  vial of ativan from pyxis.  Administered 0.5mg  as ordered.  Pt passed away at 1100. 1.5mg  wasted in sink witnessed by 2nd RN. Unable to waste amount  in the pyxis.

## 2014-12-11 NOTE — Care Management (Signed)
Important Message  Patient Details  Name: Monica Rhodes MRN: [id number] Date of Birth: 19-Aug-1927   Medicare Important Message Given:  Yes-third notification given    Pricilla Handler 01-05-15, 2:32 PM

## 2014-12-11 NOTE — Progress Notes (Signed)
Comfort care cart ordered for family at bedside; emotional support given;

## 2014-12-11 NOTE — Progress Notes (Signed)
     Subjective:  POD#4 IM nail of the R hip. Patient unable to respond to questions.  Sleeping comfortably in bed. Nursing reports the patient had drops in her BP overnight.  Medicine feels that this is due to heart failure with organ failure.  Given her frail state, they have discussed her case with the family and they have opted for comfort care at this time. Family is at the bedside this morning.   Objective:   VITALS:   Filed Vitals:   12-19-2014 0330 19-Dec-2014 0400 12/19/14 0500 19-Dec-2014 0600  BP: 85/44 80/51 76/41  59/25  Pulse: 80 80 77 116  Temp:      TempSrc:      Resp: 31 23 25 22   Height:      Weight:      SpO2: 74% 86% 100% 99%    ABD soft Neurovascular intact Sensation intact distally Intact pulses distally Dorsiflexion/Plantar flexion intact Incision: dressing C/D/I   Lab Results  Component Value Date   WBC 10.0 Dec 19, 2014   HGB 8.2* 12-19-14   HCT 24.0* 12/19/14   MCV 90.0 12/19/14   PLT 214 19-Dec-2014   BMET    Component Value Date/Time   NA 140 2014-12-19 0251   K 5.3* 19-Dec-2014 0251   CL 101 12/19/2014 0251   CO2 27 12-19-2014 0245   GLUCOSE 108* 19-Dec-2014 0251   BUN 98* 12-19-14 0251   CREATININE 3.30* 2014-12-19 0251   CALCIUM 8.2* 12-19-2014 0245   GFRNONAA 12* Dec 19, 2014 0245   GFRAA 14* 2014-12-19 0245     Assessment/Plan: 4 Days Post-Op   Principal Problem:   Fracture, intertrochanteric, right femur Active Problems:   Hypertension   Hyperlipidemia   DM (diabetes mellitus)   COPD (chronic obstructive pulmonary disease)   CKD (chronic kidney disease) stage 3, GFR 30-59 ml/min   Chronic systolic CHF (congestive heart failure)   Atrial fibrillation and flutter   Pressure ulcer   Protein-calorie malnutrition, severe   Acute respiratory failure with hypoxia and hypercarbia   COLD (chronic obstructive lung disease)   Acute on chronic systolic CHF (congestive heart failure)   Acute delirium  Patient has been converted to  comfort care overnight.  Family is at the bedside.  Spoke with the family.  If there is anything Dr. Percell Miller or I can do to help the family please let us know.  At this time we will sign off.  Please contact us if there are any changes.   Ahriyah Vannest Lelan Pons 12/19/14, 7:33 AM Cell [phone]

## 2014-12-11 NOTE — Consult Note (Signed)
Consultation Note Date: December 31, 2014   Patient Name: Monica Rhodes  DOB: 06-Jun-1928  MRN: [id number]  Age / Sex: 81 y.o., female   PCP: Lucianne Lei, MD Referring Physician: Allie Bossier, MD  Reason for Consultation: Terminal care  Palliative Care Assessment and Plan Summary of Established Goals of Care and Medical Treatment Preferences    Palliative Care Discussion Held Today:   Ms. Dewalt is lying in bed, unresponsive, family holding vigil at bedside. She is at end of life and I educated family at bedside signs of end of life. They tell me she has had apneic episodes. I explained feet are cold and cyanotic, jaw relaxed, lips cyanotic, pale and BP 49/19 - explained these findings to family. Explained to family that I think she is very close to passing and I would be very surprised if she lived through the day. Discussed symptom management and they tell me that she has been very restless over night and this morning and they are requesting a continuous infusion. Support provided.    Contacts/Participants in Discussion: Primary Decision Maker: Sons at bedside   Goals of Care/Code Status/Advance Care Planning:   Code Status: DNR  Comfort care  Symptom Management:   Pain/Dyspnea: Dilaudid 0.5 mg/hr infusion with 0.5 mg bolus every 15 min prn.   Agitation:   Secretions: Robinul prn. Lorazepam 0.5 mg every 2 hours prn.   Psycho-social/Spiritual:   Support System: Many family members at bedside.   Desire for further Chaplaincy support: yes  Prognosis: Hours  Discharge Planning:  Anticipate hospital death.        Chief Complaint: Hip pain s/p fall  History of Present Illness: 81 y.o. female with history of CHF CAD COPD DM HTN Atrial Fibrillation presents with hip pain. Patient apparently took a fall while she was walking with her walker in the kitchen. She did not lose consciousness. She did not have any chest pain and she did not have any seizure activity. She landed on  her right side and had a Right intertrochanteric fracture repaired 6/26. She also has multiple medical problems including CHF with an EF of 35-40% Atrial fibrillation CAD. Subsequent decline with multi-organ system failure approaching end of life.   Primary Diagnoses  Present on Admission:  . Atrial fibrillation and flutter . Chronic systolic CHF (congestive heart failure) . CKD (chronic kidney disease) stage 3, GFR 30-59 ml/min . COPD (chronic obstructive pulmonary disease) . Hypertension . Hyperlipidemia . Acute on chronic systolic CHF (congestive heart failure)  Palliative Review of Systems:   Unable to assess - unresponsive   I have reviewed the medical record, interviewed the patient and family, and examined the patient. The following aspects are pertinent.  Past Medical History  Diagnosis Date  . Hypertension   . UTI (urinary tract infection)   . SBO (small bowel obstruction)     Recurrent, prolonged hospitalization November, 2012  . Protein calorie malnutrition   . Hyperlipidemia   . DM (diabetes mellitus)   . COPD (chronic obstructive pulmonary disease)   . PVD (peripheral vascular disease)   . Colon cancer   . Bradycardia   . Tobacco abuse   . CAD (coronary artery disease)     Non-STEMI November, 2012,, Cardiac catheterization was attempted, despite significant efforts catheters could not be progressed to the coronary arteries ., Therefore medical therapy  . Carotid artery disease     Doppler  April 16, 2011,, total occlusion LICA, 35-57% R. ICA  . PAD (peripheral artery disease)  Suggestions significant stenosis distal aorta by CT angiogram  November, 2012  . Ejection fraction     EF 60%, echo, November, 2012,  . Mitral regurgitation     Moderate, echo, November, 2012  . Right ventricular dysfunction     Moderate, echo, November, 2012  . Edema     December, 2012  . Rash     Rash  probably related to hydralazine  December, 2012  . Hx of echocardiogram      Echo 3/16:  EF 35-40%, ant-lat HK, mod MR, mod LAE, mod to large pericardial effusion with no evidence of tamponade (unchanged from prior echo)  . PONV (postoperative nausea and vomiting)     post op confusion, combative  . CHF (congestive heart failure)    History   Social History  . Marital Status: Widowed    Spouse Name: N/A  . Number of Children: 5  . Years of Education: N/A   Social History Main Topics  . Smoking status: Former Smoker -- 0.50 packs/day    Types: Cigarettes    Quit date: 06/12/2014  . Smokeless tobacco: Not on file  . Alcohol Use: No  . Drug Use: No  . Sexual Activity: Not Currently   Other Topics Concern  . None   Social History Narrative   Family History  Problem Relation Age of Onset  . Coronary artery disease    . Heart attack Mother   . Hypertension Mother   . Hypertension Father   . Hypertension Sister   . Stroke Neg Hx    Scheduled Meds:  Continuous Infusions: . HYDROmorphone     PRN Meds:.[DISCONTINUED] acetaminophen **OR** acetaminophen, albuterol, glycopyrrolate, HYDROmorphone, LORazepam, [DISCONTINUED] ondansetron **OR** ondansetron (ZOFRAN) IV Medications Prior to Admission:  Prior to Admission medications   Medication Sig Start Date End Date Taking? Authorizing Provider  amLODipine (NORVASC) 10 MG tablet Take 1 tablet (10 mg total) by mouth daily. 09/04/14  Yes Carlena Bjornstad, MD  aspirin EC 81 MG EC tablet Take 1 tablet (81 mg total) by mouth daily. 08/11/14  Yes Debbe Odea, MD  atorvastatin (LIPITOR) 20 MG tablet Take 1 tablet (20 mg total) by mouth daily at 6 PM. 09/04/14  Yes Carlena Bjornstad, MD  carvedilol (COREG) 6.25 MG tablet Take 1 tablet (6.25 mg total) by mouth 2 (two) times daily with a meal. 09/04/14  Yes Carlena Bjornstad, MD  feeding supplement, ENSURE COMPLETE, (ENSURE COMPLETE) LIQD Take 237 mLs by mouth 2 (two) times daily between meals. 08/11/14  Yes Debbe Odea, MD  furosemide (LASIX) 40 MG tablet Take 40 mg BID until  weight is 121 lbs then take 40 mg daily Patient taking differently: Take 40 mg by mouth daily.  09/04/14  Yes Carlena Bjornstad, MD  hydrALAZINE (APRESOLINE) 25 MG tablet TAKE 1 TABLET (25 MG TOTAL) BY MOUTH 3 (THREE) TIMES DAILY. 11/02/14  Yes Carlena Bjornstad, MD  isosorbide mononitrate (IMDUR) 30 MG 24 hr tablet Take 1 tablet (30 mg total) by mouth daily. 09/04/14  Yes Carlena Bjornstad, MD  LINZESS 145 MCG CAPS capsule Take 145 mcg by mouth as needed (take as needed for IBS).  07/19/14  Yes Historical Provider, MD  nitroGLYCERIN (NITROSTAT) 0.4 MG SL tablet Place 1 tablet (0.4 mg total) under the tongue every 5 (five) minutes as needed. For chest pain 09/04/14 09/04/15 Yes Carlena Bjornstad, MD   Allergies  Allergen Reactions  . Morphine And Related Other (See Comments)    Patient  stated that she can't take morphine  . Promethazine Other (See Comments)    Unknown    CBC:    Component Value Date/Time   WBC 10.0 04-Jan-2015 0245   HGB 8.2* January 04, 2015 0251   HCT 24.0* 01/04/15 0251   PLT 214 01-04-2015 0245   MCV 90.0 2015-01-04 0245   NEUTROABS 5.4 12/07/2014 0209   LYMPHSABS 1.0 12/07/2014 0209   MONOABS 0.7 12/07/2014 0209   EOSABS 0.0 12/07/2014 0209   BASOSABS 0.0 12/07/2014 0209   Comprehensive Metabolic Panel:    Component Value Date/Time   NA 140 2015/01/04 0251   K 5.3* 2015-01-04 0251   CL 101 Jan 04, 2015 0251   CO2 27 2015-01-04 0245   BUN 98* 2015/01/04 0251   CREATININE 3.30* 01/04/2015 0251   GLUCOSE 108* 01-04-15 0251   CALCIUM 8.2* 01-04-2015 0245   AST 4053* 01/04/15 0245   ALT 625* 01/04/15 0245   ALKPHOS 74 01/04/15 0245   BILITOT 1.0 01/04/15 0245   PROT 5.3* 01-04-15 0245   ALBUMIN 2.1* 2015-01-04 0245    Physical Exam:  Vital Signs: BP 67/35 mmHg  Pulse 59  Temp(Src) 97.7 F (36.5 C) (Axillary)  Resp 24  Ht 5\' 2"  (1.575 m)  Wt 61.6 kg (135 lb 12.9 oz)  BMI 24.83 kg/m2  SpO2 100% SpO2: SpO2: 100 % O2 Device: O2 Device: Nasal Cannula O2 Flow  Rate: O2 Flow Rate (L/min): 6 L/min Intake/output summary:  Intake/Output Summary (Last 24 hours) at 01/04/15 1022 Last data filed at 04-Jan-2015 0600  Gross per 24 hour  Intake    603 ml  Output    205 ml  Net    398 ml   LBM: Last BM Date: 12/04/14 Baseline Weight: Weight: 55.792 kg (123 lb) Most recent weight: Weight: 61.6 kg (135 lb 12.9 oz)  Exam Findings:   General: NAD, lying in bed, pale HEENT: Pale, lips cyanotic CVS: Hypotensive Resp: No labored breathing Abd: Soft, NT, ND Extrem: Cool to touch, dry Neuro: Unresponsive           Palliative Performance Scale: 10 %                Additional Data Reviewed: Recent Labs     12/09/14  0220  01-04-15  0245  01-04-2015  0251  WBC  9.7  10.0   --   HGB  7.7*  8.0*  8.2*  PLT  195  214   --   NA  143  143  140  BUN  77*  91*  98*  CREATININE  2.17*  3.24*  3.30*     Time In: 0950 Time Out: 1030 Time Total: 46min  Greater than 50%  of this time was spent counseling and coordinating care related to the above assessment and plan.   Signed by:  Vinie Sill, NP Palliative Medicine Team Pager # [phone] (M-F 8a-5p) Team Phone # [phone] (Nights/Weekends)

## 2014-12-11 NOTE — Progress Notes (Signed)
Patient with progressive multisystem organ failure since yesterday. Now with ARF, shock liver, CHF with pleural effusions, mental status deterioration, and progressive hypotension. Now unresponsive after sedation and pain meds. Discussed poor prognosis with family and they would like comfort measures only.   Emaly Boschert [place] MD, [facility]  2015-01-04 9:29 AM

## 2014-12-11 NOTE — Discharge Summary (Signed)
Death Summary  Monica Rhodes KTG:[id number] DOB: Oct 19, 1927 DOA: 2014/12/07  PCP: Elyn Peers, MD PCP/Office notified:   Admit date: 12/07/2014 Date of Death: 14-Dec-2014  Final Diagnoses:  Principal Problem:   Fracture, intertrochanteric, right femur Active Problems:   Hypertension   Hyperlipidemia   DM (diabetes mellitus)   COPD (chronic obstructive pulmonary disease)   CKD (chronic kidney disease) stage 3, GFR 30-59 ml/min   Chronic systolic CHF (congestive heart failure)   Atrial fibrillation and flutter   Pressure ulcer   Protein-calorie malnutrition, severe   Acute respiratory failure with hypoxia and hypercarbia   COLD (chronic obstructive lung disease)   Acute on chronic systolic CHF (congestive heart failure)   Acute delirium  Shock liver/failure/multisystem organ failure -Overnight [facility] floor coverage page to bedside secondary to patient's increasing lethargy. CMP obtained which showed elevated with AST>4000 (was 29).  -NP Luanne Bras, had a goals of care conference overnight and it was decided to make patient comfort care. -Patient passed away at 1100 today. Secondary to fall patient's case was automatically referred to the medical examiner, who is completing all required paperwork.  Acute on chronic hypercapnic and hypoxemic respiratory failure -Overnight patient became more tachypnea although her SPO2 remained adequate.    Pulmonary edema Improved  Acute delirium - encephalopathy -Most likely secondary to multisystem organ failure.  Atrial fibrillation with RVR -Not felt to be an anticoagulation candidate due to falls  - rate control being managed by Cardiology  Acute on chronic systolic congestive heart failure -EF 35-40 percent per TTE March 2016  -TTE 6/24 EF 20-25 percent with diffuse hypokinesis and moderate MR; this represents worsening CHF.  Known moderate to large pericardial effusion -Reportedly stable on serial TTE -Comfort care  measures  Normocytic anemia -Probably reflective of poor nutritional status plus small-volume perioperative loss - follow trend  Acute renal failure on CKD stage III -Creatinine increasing secondary to hypo-profusion -Comfort care measures  Hypokalemia -Resolved with supplementation  DM -CBG well controlled - follow  Right intertrochanteric hip fracture following mechanical fall -S/p R IM nail 6/26  Severe malnutrition in context of chronic illness    Significant Events: 12-07-2022 - admitted with right intertrochanteric fx. 6/24 - PCCM consulted for acute on chronic hypercapnic resp failure, ETT placed  6/25 - improved pcxr, improved abg, neg 143 cc 6/26 - to OR for R hip repair  History of present illness:  Monica Rhodes is a 81 y.o. female with history of CHF CAD COPD DM HTN Atrial Fibrillation presents with hip pain. Patient apparently took a fall while she was walking with her walker in the kitchen. She did not lose consciousness. She did not have any chest pain and she did not have any seizure activity. She landed on her right side and now has a Right intertrochanteric fracture. She also has multiple medical problems including CHF with an EF of 35-40% Atrial fibrillation CAD. In the ED she was noted to be in Atrial fibrillation heart running in the 80s to low 110s initially but later became RVR in the 130s-140s.  During his hospitalization post right hip fracture repair patient began to experience increasing CHF, renal failure, liver failure (multisystem organ failure). Patient was made comfort care by her family and expired comfortably at 1100 on 12/14/2022   Time: 0 min  Signed:  Dia Crawford, MD [facility] [phone]

## 2014-12-11 DEATH — deceased

## 2014-12-15 DIAGNOSIS — R0602 Shortness of breath: Secondary | ICD-10-CM | POA: Insufficient documentation

## 2014-12-24 ENCOUNTER — Other Ambulatory Visit: Payer: Self-pay | Admitting: Cardiology

## 2014-12-29 ENCOUNTER — Ambulatory Visit: Payer: Medicare Other | Admitting: Cardiology

## 2015-02-22 ENCOUNTER — Other Ambulatory Visit: Payer: Self-pay | Admitting: Cardiology

## 2016-06-01 IMAGING — CR DG ABD PORTABLE 1V
3 series · 3 of 3 positions shown · non-contrast
Comparison: 12/04/2014 CT abdomen pelvis -04/16/2012

CLINICAL DATA: OG tube placement

EXAM:
PORTABLE ABDOMEN - 1 VIEW

[AP (1 of 3)]
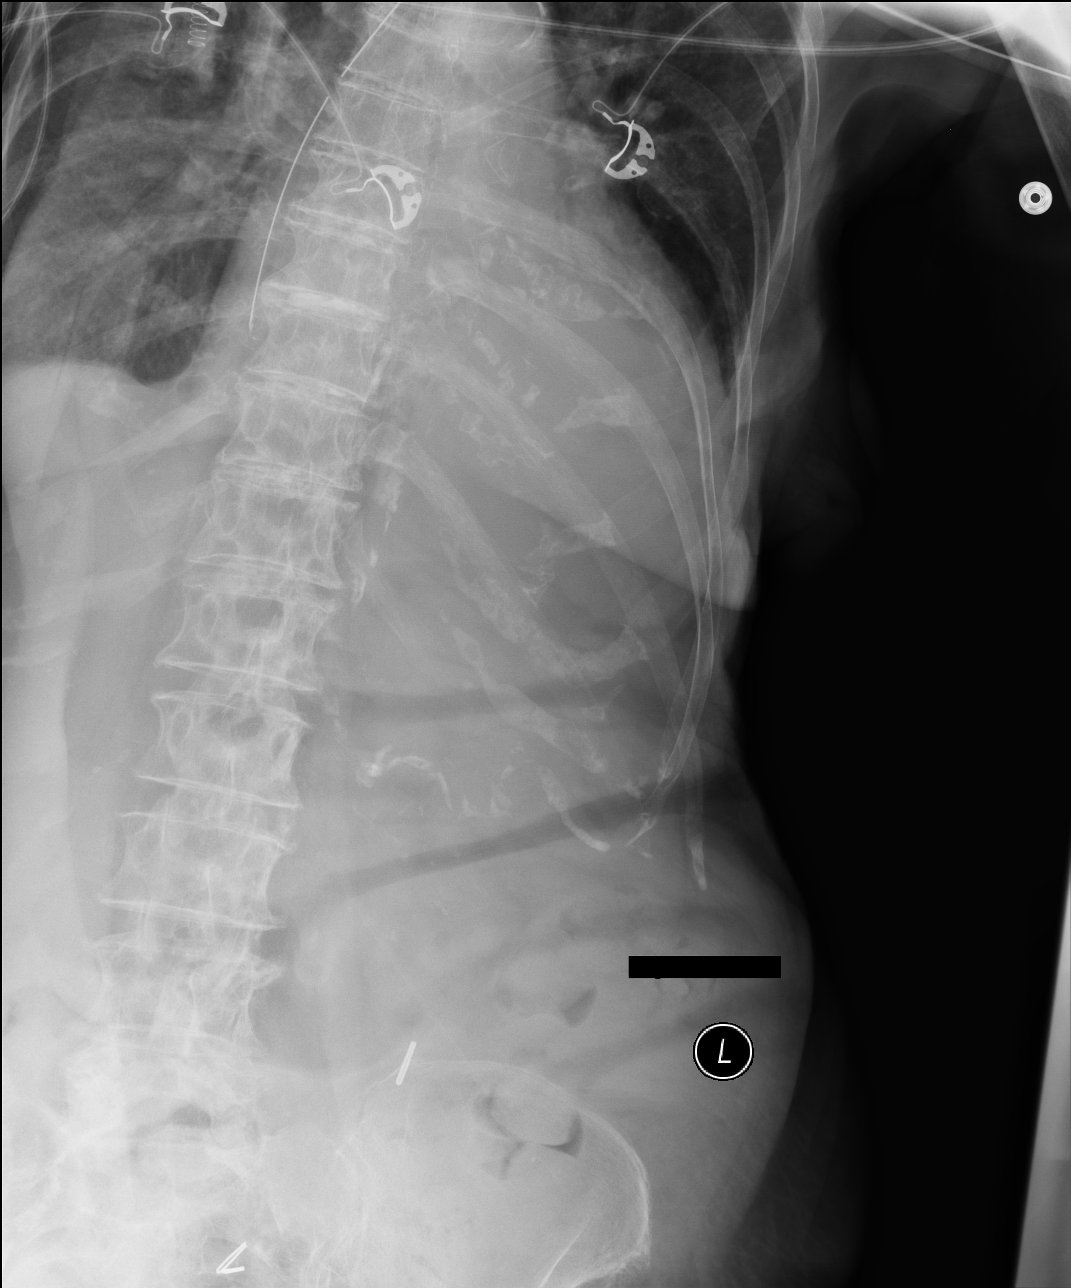

[AP (2 of 3)]
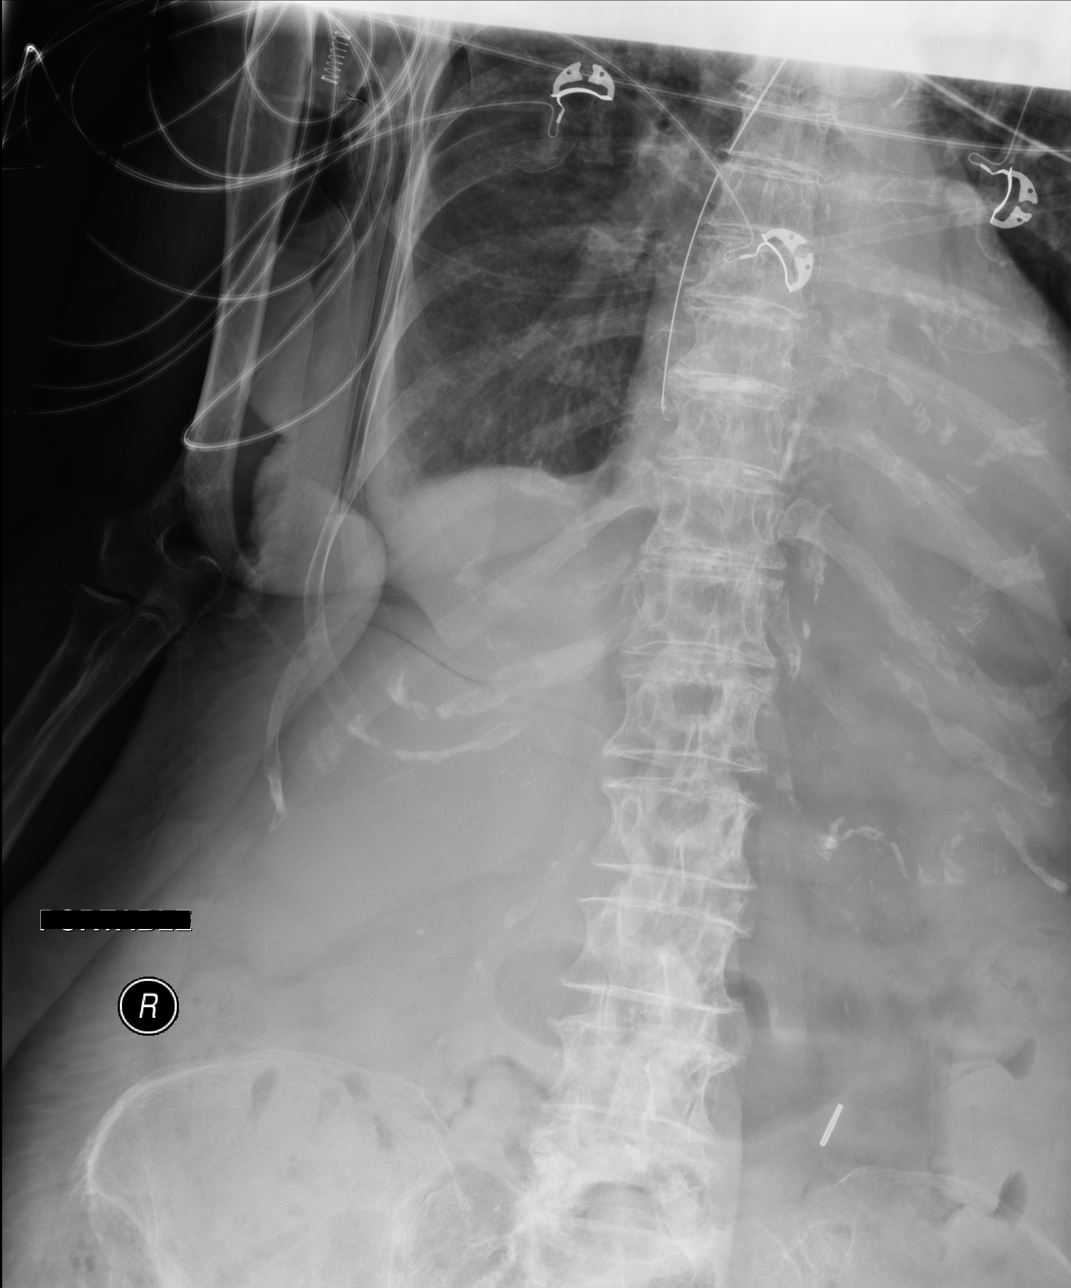

[AP (3 of 3)]
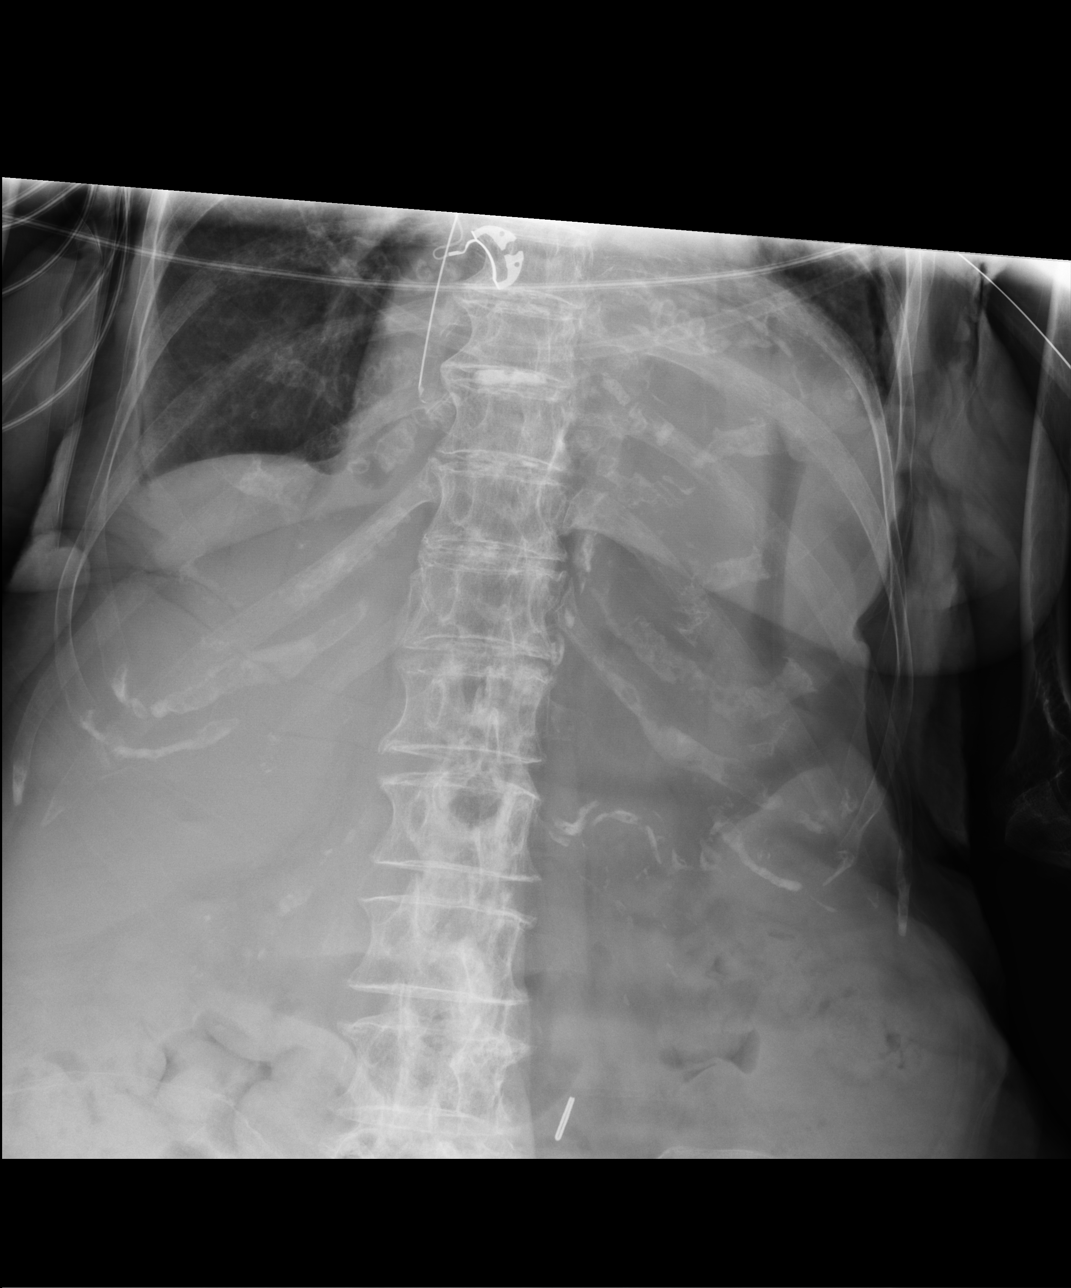

[3 of 3 positions shown; findings below may reference images not displayed]

FINDINGS: Enteric tube tip and side port projected the expected location the
distal esophagus.

Several surgical clips overlie the left mid hemi abdomen and lower
pelvis.

Limited visualization of lower thorax demonstrates mild elevation of
left hemidiaphragm with associated left basilar/retrocardiac
heterogeneous/consolidative opacities.

No definite acute osseus abnormalities.
IMPRESSION: Interval retraction of enteric tube with tip and side port now
projected of the distal esophagus. Advancement at least 20 cm is
recommended.

## 2016-06-01 IMAGING — CR DG ABD PORTABLE 1V
1 series · 1 of 1 positions shown · non-contrast
Comparison: Earlier this day at 3146 hr

CLINICAL DATA: Orogastric tube placement.

EXAM:
PORTABLE ABDOMEN - 1 VIEW

[AP]
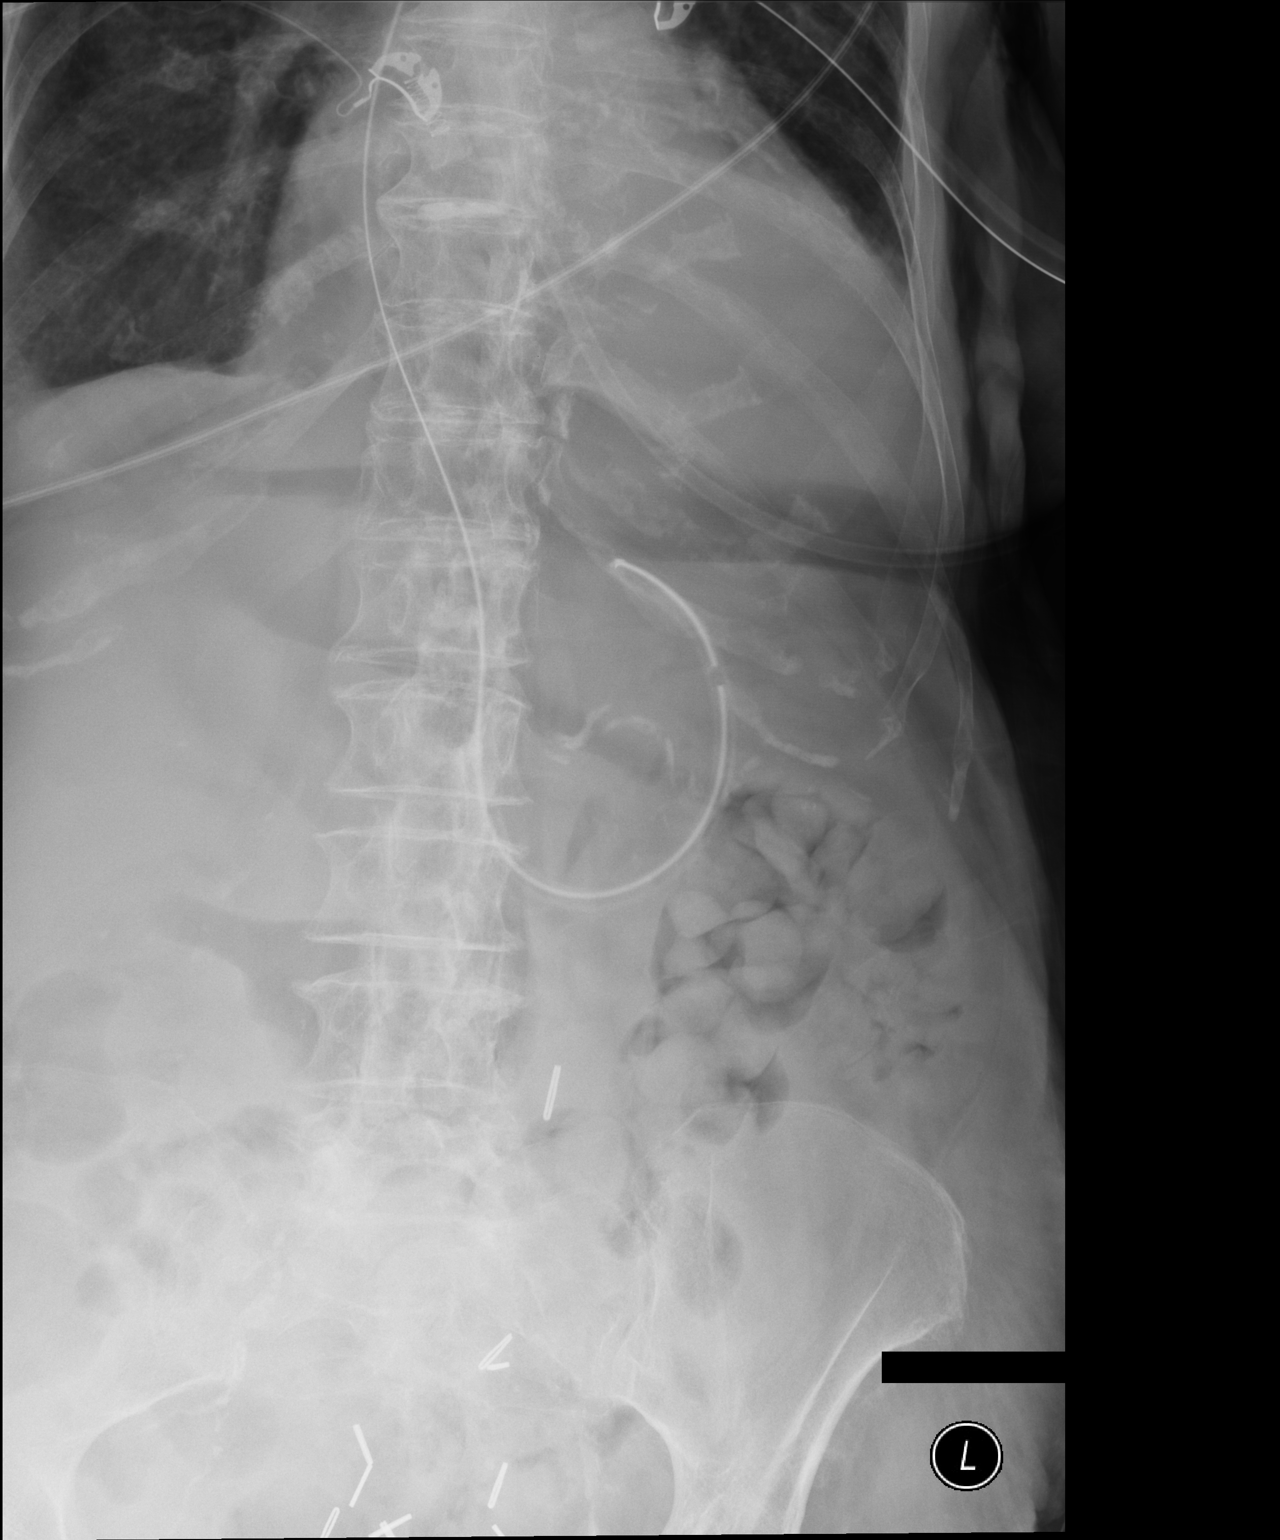

[1 of 1 positions shown; findings below may reference images not displayed]

FINDINGS: The enteric tube has been advanced, tip and side-port now below the
diaphragm in the stomach. The tip is directed towards the cardia.
There is a normal bowel gas pattern.
IMPRESSION: Tip and side port of the enteric tube below the diaphragm in the
stomach.

## 2016-06-01 IMAGING — RF DG HIP (WITH PELVIS) OPERATIVE*R*
1 series · 4 of 4 positions shown · non-contrast
Comparison: 12/03/2014

CLINICAL DATA: ORIF right hip.

EXAM:
OPERATIVE RIGHT HIP (WITH PELVIS IF PERFORMED)  VIEWS
TECHNIQUE: Fluoroscopic spot image(s) were submitted for interpretation
post-operatively.
FLUOROSCOPY TIME:  Fluoroscopy Time:  1 minutes 29 seconds
Number of Acquired Images:  5

[Series 1: run · 4 of 4 slices shown]
[im 1/4]
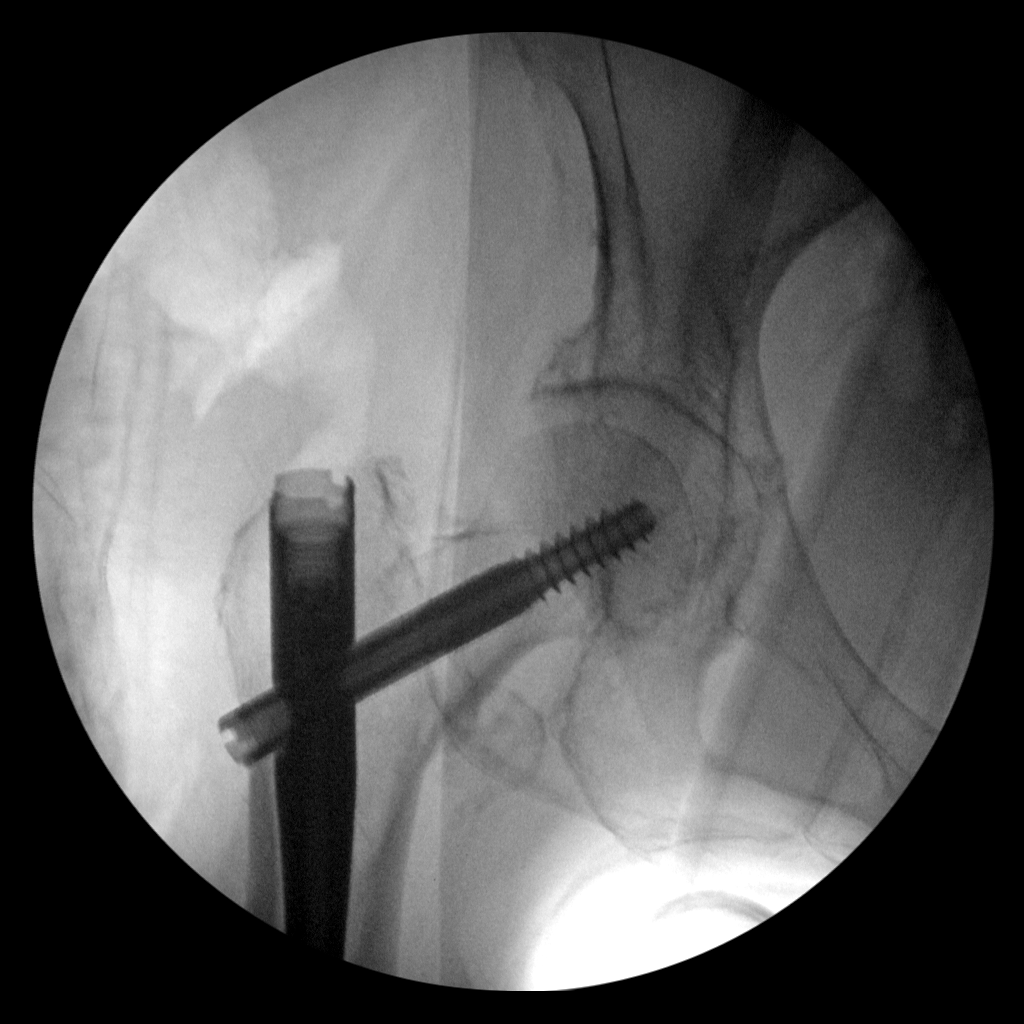
[im 2/4]
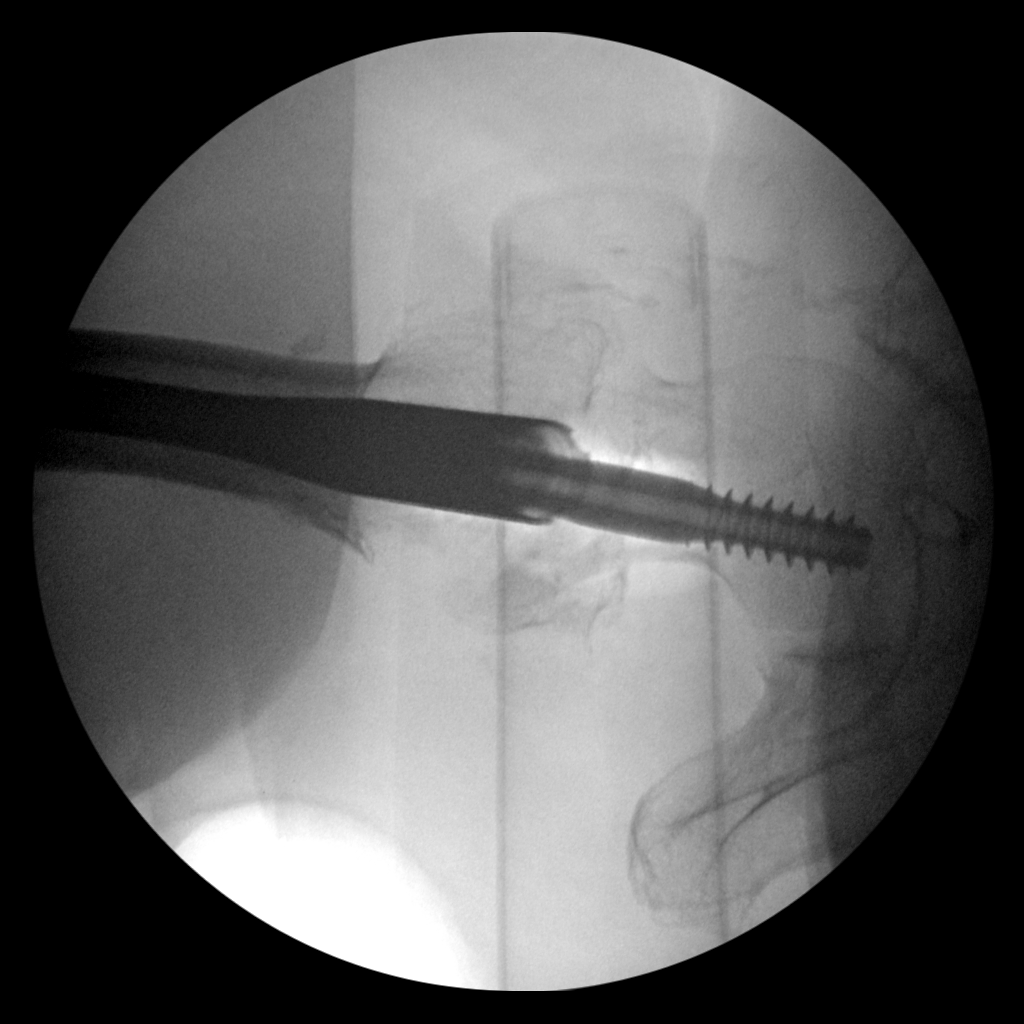
[im 3/4]
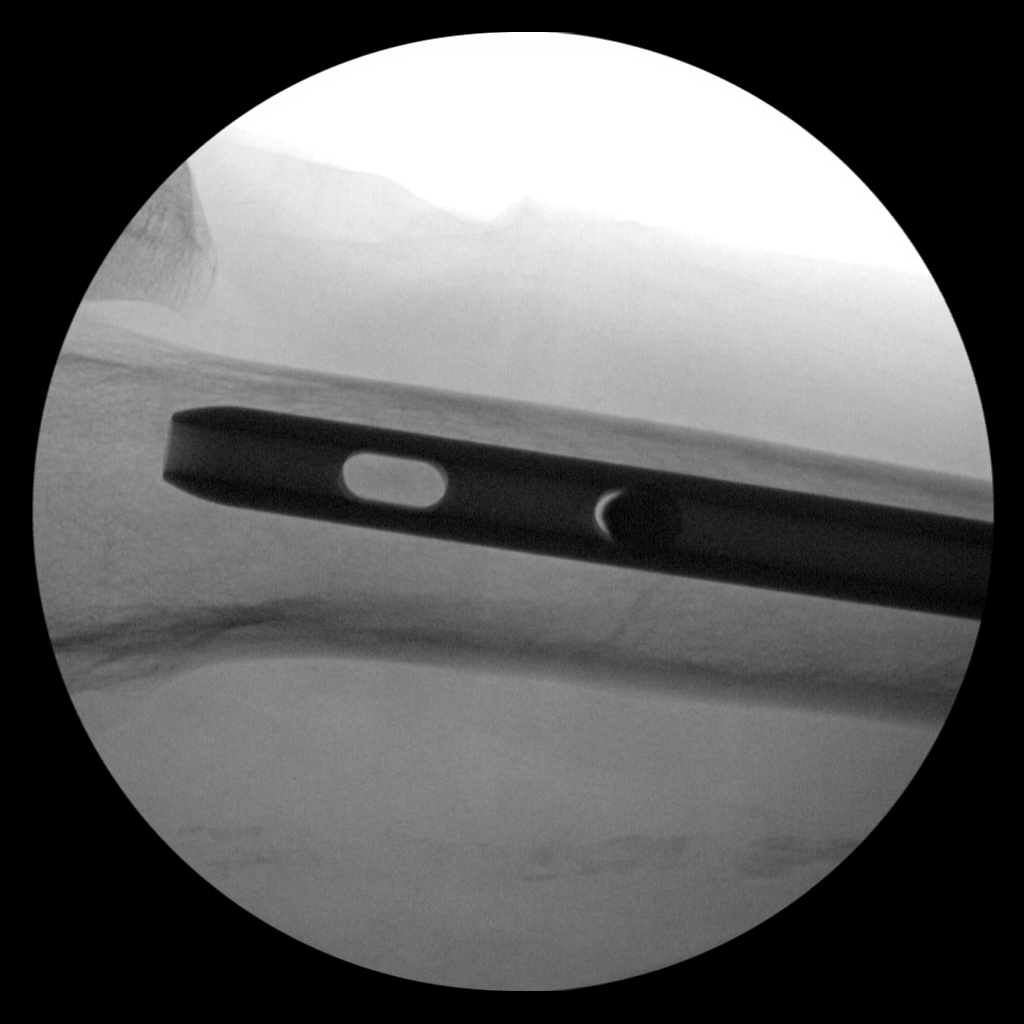
[im 4/4]
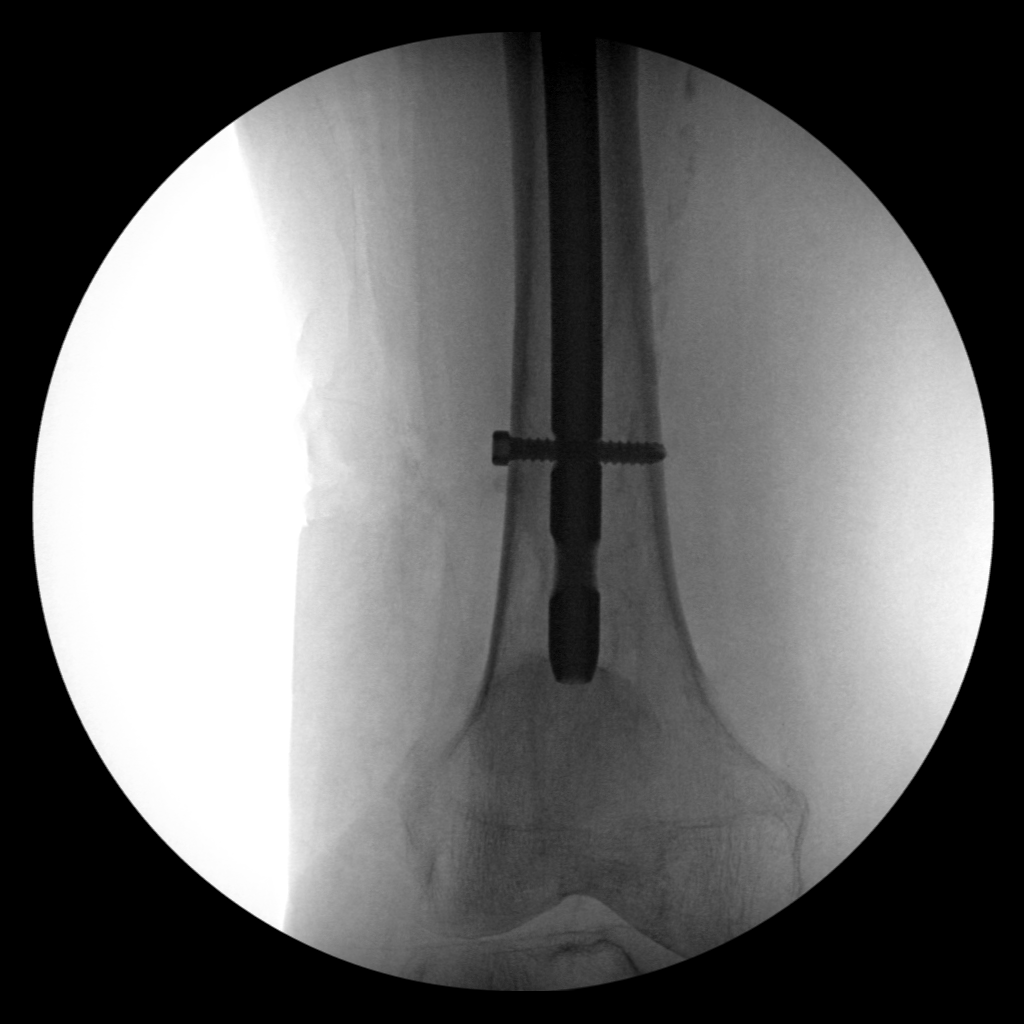

[4 of 4 positions shown; findings below may reference images not displayed]

FINDINGS: The images submitted show ORIF of right hip fracture using intra
medullary nail and lag screw. No evidence for dislocation or new
fracture.
IMPRESSION: ORIF right femoral neck fracture.

## 2016-06-02 IMAGING — CR DG CHEST 1V PORT
2 series · 2 of 2 positions shown · non-contrast
Comparison: Portable exam 5755 hours compared to 12/06/2014

CLINICAL DATA: Pulmonary edema, history hypertension,
hyperlipidemia, diabetes mellitus, COPD, coronary artery disease,
CHF, former smoker

EXAM:
PORTABLE CHEST - 1 VIEW

[AP (1 of 2)]
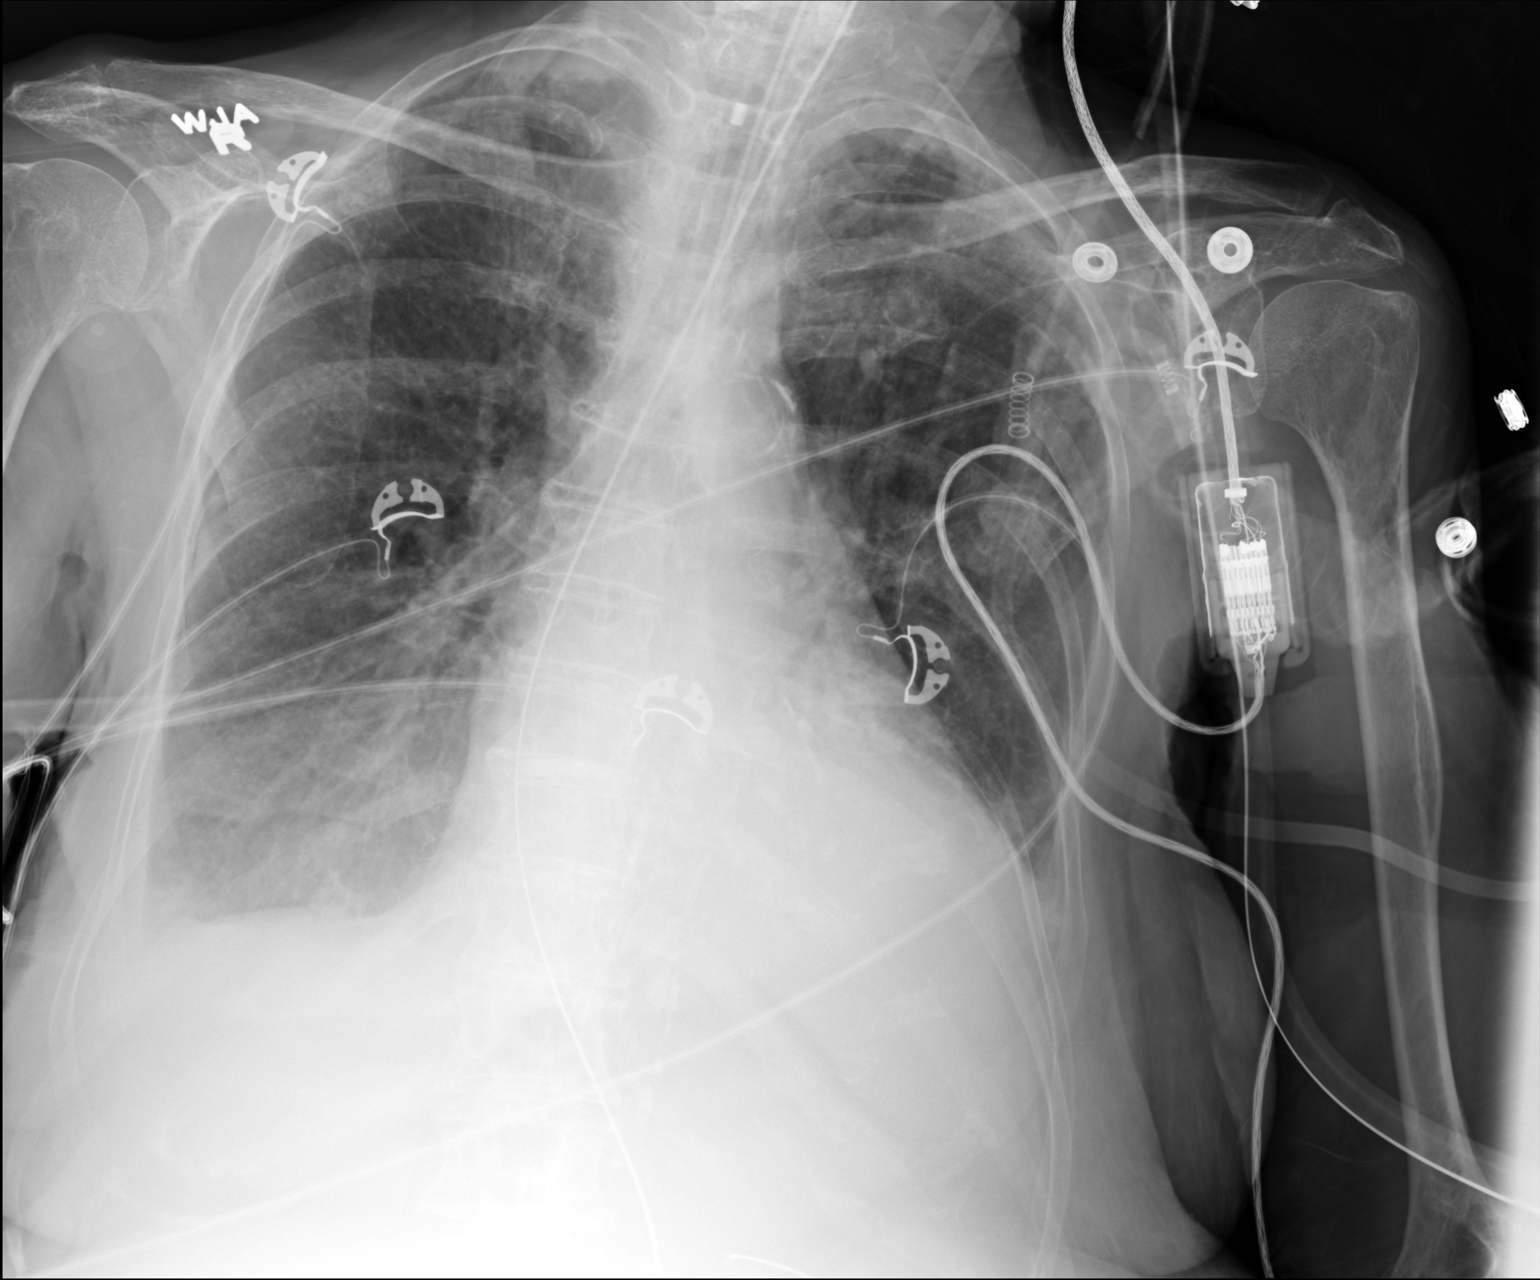

[AP (2 of 2)]
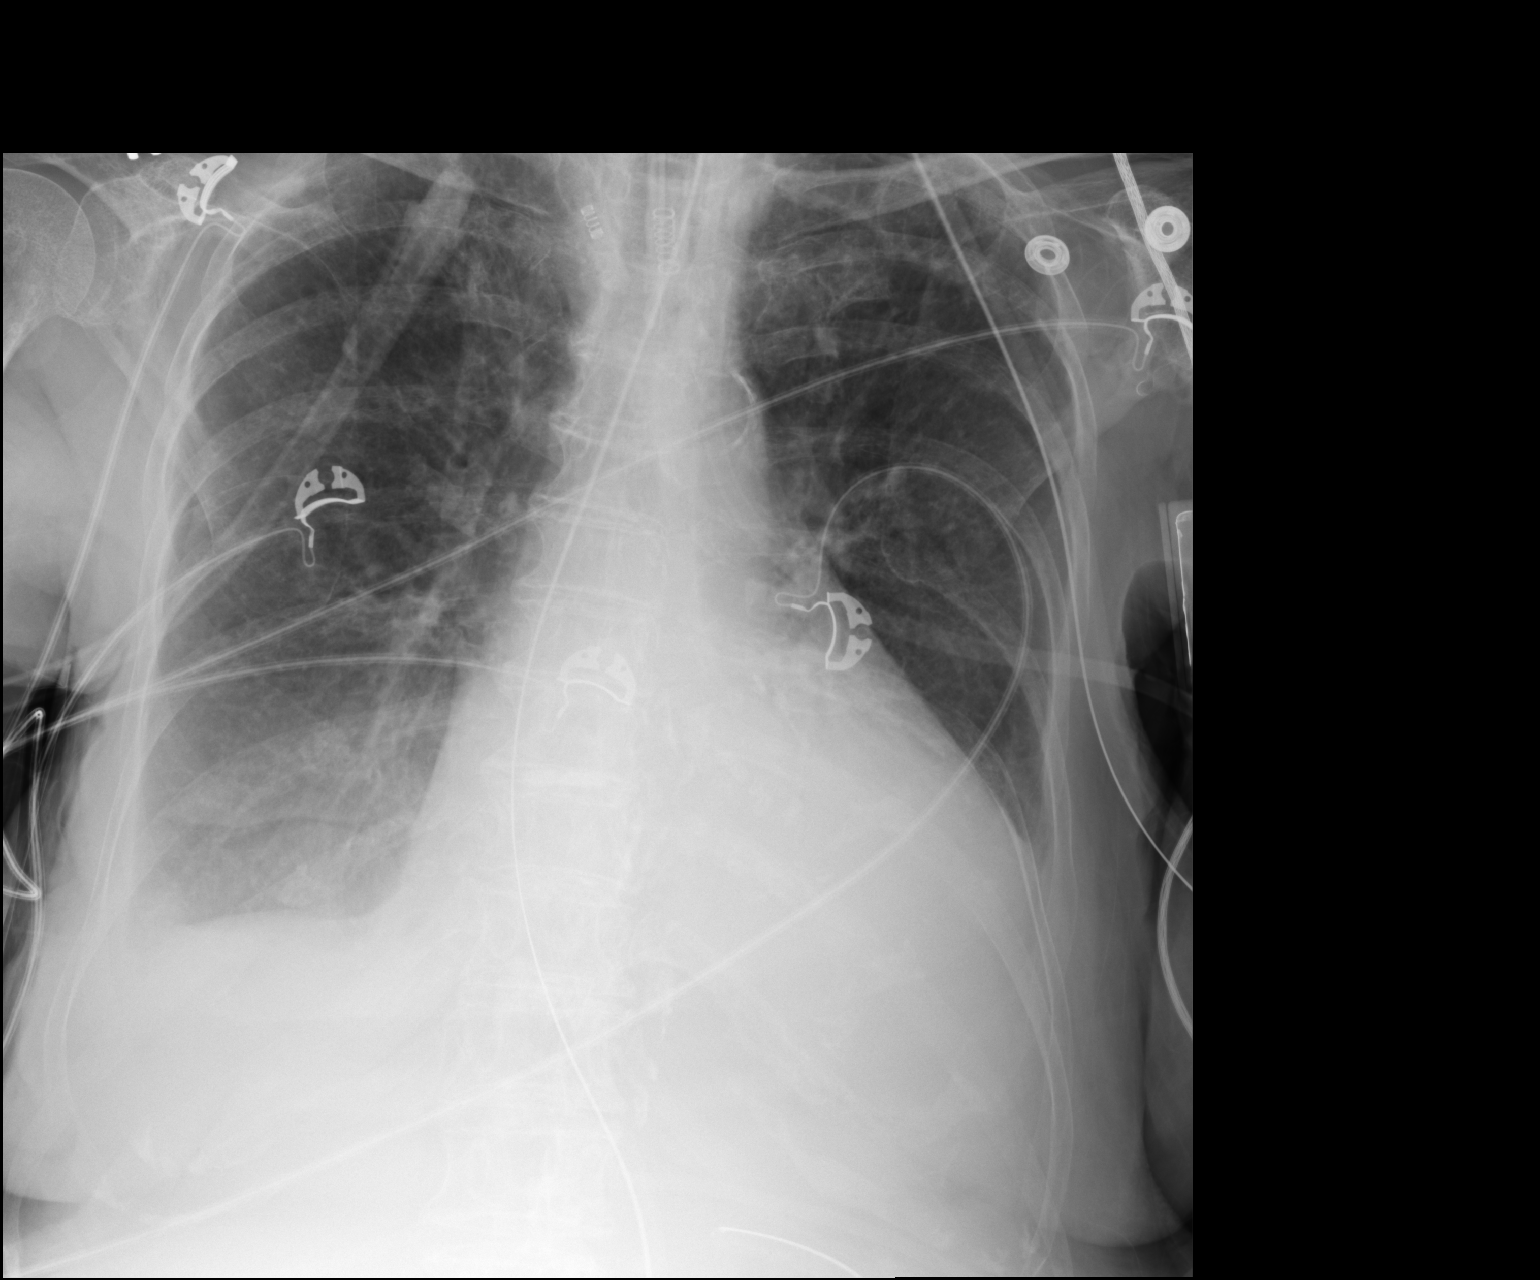

[2 of 2 positions shown; findings below may reference images not displayed]

FINDINGS: Tip of endotracheal tube projects 4.9 cm above carina.

Nasogastric tube coiled in stomach.

Enlargement of cardiac silhouette.

Mediastinal contours and pulmonary vascularity normal.

Atelectasis versus consolidation LEFT lower lobe.

Probable bibasilar effusions.

Underlying emphysematous changes and central peribronchial
thickening.

Atherosclerotic calcification aorta.

No pneumothorax.

Bones demineralized.
IMPRESSION: COPD changes with LEFT lower lobe atelectasis versus consolidation
and probable bibasilar effusions.

Enlargement of cardiac silhouette.
